# Patient Record
Sex: Female | Born: 1955
Health system: Southern US, Community
[De-identification: ages and names within clinical notes are randomized; demographics above are authoritative.]

## PROBLEM LIST (undated history)

## (undated) DIAGNOSIS — M1990 Unspecified osteoarthritis, unspecified site: Secondary | ICD-10-CM

## (undated) DIAGNOSIS — M109 Gout, unspecified: Secondary | ICD-10-CM

## (undated) DIAGNOSIS — G4730 Sleep apnea, unspecified: Secondary | ICD-10-CM

## (undated) DIAGNOSIS — R51 Headache: Secondary | ICD-10-CM

## (undated) DIAGNOSIS — M2550 Pain in unspecified joint: Secondary | ICD-10-CM

## (undated) DIAGNOSIS — J302 Other seasonal allergic rhinitis: Secondary | ICD-10-CM

## (undated) DIAGNOSIS — R7303 Prediabetes: Secondary | ICD-10-CM

## (undated) DIAGNOSIS — J45909 Unspecified asthma, uncomplicated: Secondary | ICD-10-CM

## (undated) DIAGNOSIS — K429 Umbilical hernia without obstruction or gangrene: Secondary | ICD-10-CM

## (undated) DIAGNOSIS — I10 Essential (primary) hypertension: Secondary | ICD-10-CM

## (undated) HISTORY — PX: HERNIA REPAIR: SHX51

## (undated) HISTORY — PX: SHOULDER SURGERY: SHX246

## (undated) HISTORY — DX: Sleep apnea, unspecified: G47.30

## (undated) HISTORY — PX: TONSILLECTOMY AND ADENOIDECTOMY: SUR1326

## (undated) HISTORY — DX: Unspecified asthma, uncomplicated: J45.909

## (undated) HISTORY — DX: Essential (primary) hypertension: I10

## (undated) HISTORY — DX: Pain in unspecified joint: M25.50

## (undated) HISTORY — DX: Gout, unspecified: M10.9

## (undated) HISTORY — DX: Other seasonal allergic rhinitis: J30.2

---

## 1996-09-08 HISTORY — PX: BREAST SURGERY: SHX581

## 2000-10-02 ENCOUNTER — Emergency Department (HOSPITAL_COMMUNITY): Admission: EM | Admit: 2000-10-02 | Discharge: 2000-10-02 | Payer: Self-pay

## 2000-12-31 ENCOUNTER — Other Ambulatory Visit: Admission: RE | Admit: 2000-12-31 | Discharge: 2000-12-31 | Payer: Self-pay | Admitting: Obstetrics and Gynecology

## 2000-12-31 ENCOUNTER — Encounter: Payer: Self-pay | Admitting: *Deleted

## 2000-12-31 ENCOUNTER — Encounter: Admission: RE | Admit: 2000-12-31 | Discharge: 2000-12-31 | Payer: Self-pay | Admitting: *Deleted

## 2001-08-02 ENCOUNTER — Encounter: Admission: RE | Admit: 2001-08-02 | Discharge: 2001-08-02 | Payer: Self-pay | Admitting: Family Medicine

## 2001-08-16 ENCOUNTER — Encounter: Admission: RE | Admit: 2001-08-16 | Discharge: 2001-08-16 | Payer: Self-pay | Admitting: Family Medicine

## 2001-09-15 ENCOUNTER — Encounter: Admission: RE | Admit: 2001-09-15 | Discharge: 2001-09-15 | Payer: Self-pay | Admitting: Family Medicine

## 2001-09-29 ENCOUNTER — Encounter: Admission: RE | Admit: 2001-09-29 | Discharge: 2001-09-29 | Payer: Self-pay | Admitting: Family Medicine

## 2001-10-18 ENCOUNTER — Encounter: Admission: RE | Admit: 2001-10-18 | Discharge: 2001-10-18 | Payer: Self-pay | Admitting: Family Medicine

## 2001-11-08 ENCOUNTER — Encounter: Admission: RE | Admit: 2001-11-08 | Discharge: 2001-11-08 | Payer: Self-pay | Admitting: Family Medicine

## 2001-11-15 ENCOUNTER — Encounter: Admission: RE | Admit: 2001-11-15 | Discharge: 2001-11-15 | Payer: Self-pay | Admitting: Family Medicine

## 2001-12-20 ENCOUNTER — Encounter: Admission: RE | Admit: 2001-12-20 | Discharge: 2001-12-20 | Payer: Self-pay | Admitting: Family Medicine

## 2002-02-22 ENCOUNTER — Other Ambulatory Visit: Admission: RE | Admit: 2002-02-22 | Discharge: 2002-02-22 | Payer: Self-pay | Admitting: Obstetrics and Gynecology

## 2003-02-24 ENCOUNTER — Other Ambulatory Visit: Admission: RE | Admit: 2003-02-24 | Discharge: 2003-02-24 | Payer: Self-pay | Admitting: Obstetrics and Gynecology

## 2004-02-27 ENCOUNTER — Other Ambulatory Visit: Admission: RE | Admit: 2004-02-27 | Discharge: 2004-02-27 | Payer: Self-pay | Admitting: Obstetrics and Gynecology

## 2005-05-05 ENCOUNTER — Other Ambulatory Visit: Admission: RE | Admit: 2005-05-05 | Discharge: 2005-05-05 | Payer: Self-pay | Admitting: Obstetrics and Gynecology

## 2005-05-09 ENCOUNTER — Encounter: Admission: RE | Admit: 2005-05-09 | Discharge: 2005-05-09 | Payer: Self-pay | Admitting: Obstetrics and Gynecology

## 2006-01-03 ENCOUNTER — Emergency Department (HOSPITAL_COMMUNITY): Admission: EM | Admit: 2006-01-03 | Discharge: 2006-01-04 | Payer: Self-pay | Admitting: Emergency Medicine

## 2007-06-09 ENCOUNTER — Ambulatory Visit (HOSPITAL_COMMUNITY): Admission: RE | Admit: 2007-06-09 | Discharge: 2007-06-09 | Payer: Self-pay | Admitting: *Deleted

## 2007-06-23 ENCOUNTER — Ambulatory Visit (HOSPITAL_COMMUNITY): Admission: RE | Admit: 2007-06-23 | Discharge: 2007-06-23 | Payer: Self-pay | Admitting: *Deleted

## 2007-06-28 ENCOUNTER — Emergency Department (HOSPITAL_COMMUNITY): Admission: EM | Admit: 2007-06-28 | Discharge: 2007-06-28 | Payer: Self-pay | Admitting: Podiatry

## 2007-06-28 ENCOUNTER — Encounter: Admission: RE | Admit: 2007-06-28 | Discharge: 2007-06-28 | Payer: Self-pay | Admitting: *Deleted

## 2007-09-09 HISTORY — PX: LAPAROSCOPIC GASTRIC BANDING: SHX1100

## 2008-01-10 ENCOUNTER — Encounter: Admission: RE | Admit: 2008-01-10 | Discharge: 2008-04-09 | Payer: Self-pay | Admitting: *Deleted

## 2008-01-25 ENCOUNTER — Ambulatory Visit (HOSPITAL_COMMUNITY): Admission: RE | Admit: 2008-01-25 | Discharge: 2008-01-26 | Payer: Self-pay | Admitting: *Deleted

## 2009-06-24 ENCOUNTER — Emergency Department (HOSPITAL_BASED_OUTPATIENT_CLINIC_OR_DEPARTMENT_OTHER): Admission: EM | Admit: 2009-06-24 | Discharge: 2009-06-24 | Payer: Self-pay | Admitting: Emergency Medicine

## 2010-10-31 ENCOUNTER — Other Ambulatory Visit: Payer: Self-pay | Admitting: Obstetrics and Gynecology

## 2010-10-31 DIAGNOSIS — Z1231 Encounter for screening mammogram for malignant neoplasm of breast: Secondary | ICD-10-CM

## 2010-11-08 ENCOUNTER — Ambulatory Visit
Admission: RE | Admit: 2010-11-08 | Discharge: 2010-11-08 | Disposition: A | Discharge status: Home / Self Care | Payer: 59 | Source: Ambulatory Visit | Attending: Obstetrics and Gynecology | Admitting: Obstetrics and Gynecology

## 2010-11-08 DIAGNOSIS — Z1231 Encounter for screening mammogram for malignant neoplasm of breast: Secondary | ICD-10-CM

## 2011-01-21 NOTE — Op Note (Signed)
Gabrielle Moore, Gabrielle Moore             ACCOUNT NO.:  [id number]   MEDICAL RECORD NO.:  [id number]          PATIENT TYPE:  OIB   LOCATION:  1523                         FACILITY:  [facility]   PHYSICIAN:  Alfonse Ras, MD   DATE OF BIRTH:  1956-04-02   DATE OF PROCEDURE:  01/25/2008  DATE OF DISCHARGE:                               OPERATIVE REPORT   PREOPERATIVE DIAGNOSIS:  Medically refractory morbid obesity.   POSTOPERATIVE DIAGNOSIS:  Medically refractory morbid obesity.   PROCEDURE:  Laparoscopic adjustable gastric banding with the APS  Allergan system.   ANESTHESIA:  General.   SURGEON:  Alfonse Ras, MD.   ASSISTANTSandria Bales. Ezzard Standing, M.D.   INDICATIONS:  The patient presents now for laparoscopic adjustable  gastric banding after having extensive preoperative evaluation.  No  evidence of reflux.  No evidence of hiatal hernia, but significant  comorbidities and presents for laparoscopic adjustable gastric banding.  The risks and benefits and nonsurgical options were discussed with the  patient and she opts to proceed.   DESCRIPTION:  The patient was taken to the operating room, placed in  supine position.  After adequate general anesthesia was induced using  endotracheal tube, the abdomen was prepped and draped in normal sterile  fashion.  Using 11 mm trocar in the Optivu left upper quadrant under  direct vision, peritoneal access was obtained.  Pneumoperitoneum was  obtained.  Under direct vision, additional 15-mm trocar was placed in  the right upper quadrant, a 11-mm trocar was placed on the right upper  quadrant.  Umbilical hernia was identified.  There was some incarcerated  omentum, this was not reduced.  A 11-mm trocar was placed in the  periumbilical region.  A 5-mm trocar was placed in subxiphoid region and  Nathanson  liver retractor was placed.  The left lateral segment of the  liver was retracted anteriorly and the stomach was identified.  There  was no  pathology noted.  The angle of his was sharply and bluntly  dissected using the band passer in the retrogastric pars flaccida  technique.  It was passed from the right crus with a crossing fat was  and brought out at the angle of his without difficulty.  The APS band  was then passed through the 15-mm port and passed around the stomach.  The sizing balloon was placed down, 15 cc of air was placed, it was  pulled back and there was no evidence of hiatal hernia.  The band was  closed.  A serosal to serosal imbricating sutures using 2-0 Ethibond  sutures were accomplished anterior to the band.  The tubing was then  brought out through the 11 mm right upper quadrant trocar site.  Nathanson liver retractor was removed.  Trocars were removed.  Pneumoperitoneum was released.  The tubing was affixed to the port and  the port was affixed using  2-0 Prolene sutures to the anterior rectus fascia.  The skin was closed  with staples.  All tissues were injected, all incisions were injected  using 0.5 Marcaine.  Sterile dressings were applied.  The patient  tolerated the procedure well went to PACU in good condition.      Alfonse Ras, MD  Electronically Signed     KRE/MEDQ  D:  01/25/2008  T:  01/25/2008  Job:  [id number]

## 2011-06-04 LAB — DIFFERENTIAL
Basophils Absolute: 0
Lymphocytes Relative: 22
Lymphs Abs: 1.7
Monocytes Absolute: 0.5
Neutro Abs: 5.5

## 2011-06-04 LAB — CBC
HCT: 43.6
Hemoglobin: 14.8
Platelets: 226
RDW: 13.8
WBC: 7.8

## 2011-06-04 LAB — PREGNANCY, URINE: Preg Test, Ur: NEGATIVE

## 2011-06-18 LAB — DIFFERENTIAL
Basophils Absolute: 0.1
Basophils Relative: 2 — ABNORMAL HIGH
Neutro Abs: 4.8
Neutrophils Relative %: 61

## 2011-06-18 LAB — COMPREHENSIVE METABOLIC PANEL
Alkaline Phosphatase: 65
BUN: 12
Chloride: 104
Creatinine, Ser: 0.69
GFR calc non Af Amer: >60
Glucose, Bld: 138 — ABNORMAL HIGH
Potassium: 3.7
Total Bilirubin: 0.9

## 2011-06-18 LAB — CBC
HCT: 42.6
Hemoglobin: 14.7
MCV: 88
WBC: 7.8

## 2011-06-18 LAB — D-DIMER, QUANTITATIVE: D-Dimer, Quant: <0.22

## 2011-07-15 ENCOUNTER — Telehealth (INDEPENDENT_AMBULATORY_CARE_PROVIDER_SITE_OTHER): Payer: Self-pay | Admitting: Surgery

## 2011-07-15 NOTE — Telephone Encounter (Signed)
07/15/11 recall mailed for bariatric surgery follow-up. Adv pt to call [facility] to schedule an appt...cef °

## 2011-10-31 ENCOUNTER — Encounter (INDEPENDENT_AMBULATORY_CARE_PROVIDER_SITE_OTHER): Payer: 59

## 2011-11-12 ENCOUNTER — Encounter (INDEPENDENT_AMBULATORY_CARE_PROVIDER_SITE_OTHER): Payer: Self-pay | Admitting: Surgery

## 2011-11-14 ENCOUNTER — Encounter (INDEPENDENT_AMBULATORY_CARE_PROVIDER_SITE_OTHER): Payer: Self-pay | Admitting: Surgery

## 2011-11-14 ENCOUNTER — Ambulatory Visit (INDEPENDENT_AMBULATORY_CARE_PROVIDER_SITE_OTHER): Payer: 59 | Admitting: Surgery

## 2011-11-14 VITALS — BP 140/80 | HR 84 | Resp 18 | Ht 67.0 in | Wt 228.0 lb

## 2011-11-14 DIAGNOSIS — Z9884 Bariatric surgery status: Secondary | ICD-10-CM | POA: Insufficient documentation

## 2011-11-14 NOTE — Progress Notes (Signed)
[facility]  Ovidio Kin, MD,  FACS [address] [place]  [address] [place], [place]    [address] Phone:  [phone] FAX:  [phone]   Re:   SYLWIA CUERVO DOB:   [birth year] MRN:   [id number]  ASSESSMENT AND PLAN: 1.  Lap band, APS - 01/25/2008.  By Dr. Colin Benton  Initial weight - 273, BMI - 42.9.  Frustrated by what she perceives as lack of weight loss.  But patient is down 40 to 50 pounds from her pre op weight.  She has an unrealistic goal weight.  I think her band may be too tight.  But out plan is:  1. Go to Lap Band website for menu suggestions, 2.  See dietitian to go over diet again, 3.  After 6 to 8 weeks of working on diet, if still trouble with food, will see [place] back about adjusting lap band - probably removing some fluid.  Her appointment is open right now, but I expect her to call back when she is ready.  2.  History of hypertension.  On Diovan.  HISTORY OF PRESENT ILLNESS: Chief Complaint  Patient presents with  . Lap Band Fill    GIANINA OLINDE is a 56 y.o. (DOB: Jan 08, 1956)  white female who is a patient of Provider Not In System and comes to me today for Lap band follow up.  She was seeing Ace Gins at [facility], but Barlow left about one year ago.  The patient was happy with her early success with her lap band. However, over the last 2-1/2 years her weight has been flat. Her original weight loss plan was to get down to 140 pounds. But I think this is unrealistic.  She has not seen [place] since 07/10/2009.  I talked about a target weight for her about 190 pounds. This represents a BMI of 30.  In talking to her, it seems that her band may be too tight. She says she can't eat a healthy stuff like steak or chicken. She can eat unhealthy processed food, like cheese bits. She also has somewhat tight at breakfast. She sometimes skips breakfast or has a protein drink.  When she goes out to eat, she feels that food gets stuck when she tries  to talk.  I talked to her about several options. First,  I encouraged her to go to the [facility] website to look at meals on that web site.  Secondly, I think she would benefit from revisiting the nutritionist to go for a LAP-BAND diet. Thir[place]y, I don't think there is a particular psychologic component, but I discussed this with her.  Fourthly, I suggested she try keeping a food diary before seeing the nutritionist. Then after she worked with the nutritionist, she can see [place] back in 6 or 8 weeks and adjust her band. I guessing that she'll need some fluid removed.   PHYSICAL EXAM: BP 140/80  Pulse 84  Resp 18  Ht 5\' 7"  (1.702 m)  Wt 228 lb (103.42 kg)  BMI 35.71 kg/m2 General: WN obese WF who is alert and generally healthy appearing.  HEENT: Normal. Pupils equal. Good dentition.  Lungs: Clear to auscultation and symmetric breath sounds. Heart:  RRR. No murmur or rub. Abdomen: Soft. No mass. No tenderness. No hernia. Normal bowel sounds.  Port in RUQ okay.  No other mass.  Rectal: Not done. Extremities:  Good strength and ROM  in upper and lower extremities. Neurologic:  Grossly intact to motor  and sensory function. Psychiatric: Has normal mood and affect. Behavior is normal.   DATA REVIEWED: Old chart.   Ovidio Kin, MD, FACS Office:  [phone]

## 2011-11-24 ENCOUNTER — Other Ambulatory Visit: Payer: Self-pay | Admitting: Obstetrics and Gynecology

## 2011-11-24 DIAGNOSIS — Z1231 Encounter for screening mammogram for malignant neoplasm of breast: Secondary | ICD-10-CM

## 2011-12-24 ENCOUNTER — Ambulatory Visit
Admission: RE | Admit: 2011-12-24 | Discharge: 2011-12-24 | Disposition: A | Discharge status: Home / Self Care | Payer: 59 | Source: Ambulatory Visit | Attending: Obstetrics and Gynecology | Admitting: Obstetrics and Gynecology

## 2011-12-24 DIAGNOSIS — Z1231 Encounter for screening mammogram for malignant neoplasm of breast: Secondary | ICD-10-CM

## 2012-01-14 ENCOUNTER — Other Ambulatory Visit (INDEPENDENT_AMBULATORY_CARE_PROVIDER_SITE_OTHER): Payer: Self-pay

## 2012-01-14 ENCOUNTER — Telehealth (INDEPENDENT_AMBULATORY_CARE_PROVIDER_SITE_OTHER): Payer: Self-pay

## 2012-01-14 DIAGNOSIS — Z9884 Bariatric surgery status: Secondary | ICD-10-CM

## 2012-01-14 NOTE — Telephone Encounter (Signed)
Patient had called back earlier and stated she called the Nutrition center to make an appt but they needed a referral.  I put one in and notified the pt

## 2012-01-14 NOTE — Telephone Encounter (Signed)
I received a msg from [place] to call pt.  I do not know what it's regarding but I left her a message that I returned her call.

## 2012-01-14 NOTE — Telephone Encounter (Signed)
Message copied by Ivory Broad on Wed Jan 14, 2012 10:47 AM ------      Message from: Marnette Burgess      Created: Tue Jan 13, 2012  4:34 PM      Contact: [phone]       Please call

## 2012-01-15 ENCOUNTER — Other Ambulatory Visit (INDEPENDENT_AMBULATORY_CARE_PROVIDER_SITE_OTHER): Payer: Self-pay

## 2012-01-15 DIAGNOSIS — Z9884 Bariatric surgery status: Secondary | ICD-10-CM

## 2012-02-16 ENCOUNTER — Encounter: Payer: 59 | Attending: Surgery | Admitting: *Deleted

## 2012-02-16 VITALS — Ht 67.0 in | Wt 237.5 lb

## 2012-02-16 DIAGNOSIS — Z9884 Bariatric surgery status: Secondary | ICD-10-CM

## 2012-02-16 NOTE — Patient Instructions (Addendum)
Goals:  Follow Modified Bariatric Surgery Diet  Eat 3-6 small meals/snacks, every 3-5 hrs  Increase lean protein foods to meet 60-80g goal  Increase fluid intake to 64oz +  Add 15 grams of carbohydrate (fruit, whole grain, starchy vegetable) with meals  Avoid drinking 15 minutes before, during and 30 minutes after eating  Aim for >30 min of physical activity daily  Track food intake for several days and email to me.   Resume the daily supplementation (1 MVI and 3 calcium citrate)

## 2012-02-16 NOTE — Progress Notes (Signed)
  Follow-up visit:  4 Years Post-Operative LAGB Surgery  Medical Nutrition Therapy:  Appt start time: 1145   End time:  1245.  Primary concerns today: Post-operative Bariatric Surgery Nutrition Management. Gabrielle Moore comes in today for f/u and reestablish care. 4 yrs s/p LAGB and discouraged about weight. Food recall reveals poor food choices and minimal exercise. Discussed restarting her wt loss with the modified post-op diet and restricting CHO. No supplementation and minimal intake of protein noted at this time.    Surgery date: 01/25/08 (Dr. Colin Benton)   Start weight per Dr. Ezzard Standing: 273.0 lbs  Weight today: 237.5 lbs Weight change: n/a Total weight lost: 35.5 lbs BMI: 37.2 kg/m^2 Weight goal: unknown % Weight goal met: unknown  24-hr recall:  B (AM): None or cereal Snk (AM): Cheese-its   L (PM): Toasted bread w/ peanut butter & honey OR tuna salad OR pizza Snk (PM): None  D (PM): Chicken wings w/ garlic honey bbq sauce & caesar salad Snk (PM): None  Fluid intake: ~60 oz Estimated total protein intake: ~40 g  Medications: Diovan Supplementation: None  Using straws: No Drinking while eating: No Hair loss: No Carbonated beverages: None reported N/V/D/C: None reported Last Lap-Band fill:  No recent band fills  Recent physical activity:  Walks dog 1 hour/day (stops often)  Progress Towards Goal(s):  In progress.  Handouts given during visit include:  Modified Bariatric Surgery Post-Op diet  Bariatric Surgery Fast Food Guide   Nutritional Diagnosis:  [place]-3.3 Overweight/obesity As related to 4 years s/p LAGB.  As evidenced by patient not following post-op nutrition guidelines and poor food choices.    Intervention:  Nutrition education/reinforcement.  Monitoring/Evaluation:  Dietary intake, exercise, lap band fills, and body weight. Follow up in 6 weeks post-op visit.

## 2012-02-18 ENCOUNTER — Encounter: Payer: Self-pay | Admitting: *Deleted

## 2012-03-29 ENCOUNTER — Ambulatory Visit: Payer: 59 | Admitting: *Deleted

## 2012-07-19 ENCOUNTER — Encounter: Payer: Self-pay | Admitting: Family Medicine

## 2012-07-19 ENCOUNTER — Ambulatory Visit (INDEPENDENT_AMBULATORY_CARE_PROVIDER_SITE_OTHER): Payer: 59 | Admitting: Family Medicine

## 2012-07-19 VITALS — BP 102/80 | HR 87 | Temp 97.8°F | Ht 65.75 in | Wt 237.0 lb

## 2012-07-19 DIAGNOSIS — E669 Obesity, unspecified: Secondary | ICD-10-CM

## 2012-07-19 DIAGNOSIS — I10 Essential (primary) hypertension: Secondary | ICD-10-CM

## 2012-07-19 DIAGNOSIS — Z131 Encounter for screening for diabetes mellitus: Secondary | ICD-10-CM

## 2012-07-19 DIAGNOSIS — R5381 Other malaise: Secondary | ICD-10-CM

## 2012-07-19 DIAGNOSIS — R5383 Other fatigue: Secondary | ICD-10-CM

## 2012-07-19 DIAGNOSIS — Z13220 Encounter for screening for lipoid disorders: Secondary | ICD-10-CM

## 2012-07-19 DIAGNOSIS — Z23 Encounter for immunization: Secondary | ICD-10-CM

## 2012-07-19 LAB — LIPID PANEL
Cholesterol: 198 mg/dL (ref 0–200)
Total CHOL/HDL Ratio: 4
Triglycerides: 114 mg/dL (ref 0.0–149.0)

## 2012-07-19 LAB — BASIC METABOLIC PANEL
BUN: 15 mg/dL (ref 6–23)
CO2: 27 mEq/L (ref 19–32)
Chloride: 104 mEq/L (ref 96–112)
Glucose, Bld: 103 mg/dL — ABNORMAL HIGH (ref 70–99)
Potassium: 3.5 mEq/L (ref 3.5–5.1)

## 2012-07-19 LAB — TSH: TSH: 1.85 u[IU]/mL (ref 0.35–5.50)

## 2012-07-19 MED ORDER — VALSARTAN-HYDROCHLOROTHIAZIDE 80-12.5 MG PO TABS: 1.0000 | ORAL_TABLET | Freq: Every day | ORAL | Status: DC

## 2012-07-19 NOTE — Progress Notes (Signed)
Chief Complaint  Patient presents with  . Establish Care    HPI: Gabrielle Moore is here to establish care. Former PCP moved.  Has the following concerns today: -refill on BP medication, stable for a long time -started weight watchers last week - pt is really excited about -she is going to start walking too with her daughter -wants labs today -unrest full sleep, snores, works a lot, sleep well, but had OSA in the past and resolved after bariatric - wants to check TSH, wants to wait of repeat sleep test as working to loose weight    Other Providers: -gyn, Dr. Senaida Ores - get yearly physicals with her, paps, mammos, stool cards  Vaccine: wants flu vaccine today, has had tetanus in last five years  ROS: See pertinent positives and negatives per HPI.  Past Medical History  Diagnosis Date  . Hypertension   . Asthma     as a child    Family History  Problem Relation Age of Onset  . Hyperlipidemia Mother   . Hypertension Mother   . Heart disease      parent   . Hyperlipidemia Father   . Hypertension Father   . Heart disease Father 51    History   Social History  . Marital Status: Married    Spouse Name: N/A    Number of Children: N/A  . Years of Education: N/A   Social History Main Topics  . Smoking status: Never Smoker   . Smokeless tobacco: None  . Alcohol Use: No  . Drug Use: No  . Sexually Active: None   Other Topics Concern  . None   Social History Narrative  . None    Current outpatient prescriptions:calcium carbonate 200 MG capsule, Take 1,000 mg by mouth daily., Disp: , Rfl: ;  loratadine (CLARITIN) 10 MG tablet, Take 10 mg by mouth daily., Disp: , Rfl: ;  valsartan-hydrochlorothiazide (DIOVAN-HCT) 80-12.5 MG per tablet, Take 1 tablet by mouth daily. Pt request BRAND NAME ONLY! Thanks, Disp: 90 tablet, Rfl: 1;  vitamin C (ASCORBIC ACID) 500 MG tablet, Take 500 mg by mouth 2 (two) times daily., Disp: , Rfl:  [DISCONTINUED]  valsartan-hydrochlorothiazide (DIOVAN-HCT) 80-12.5 MG per tablet, Take 1 tablet by mouth daily. Brand only!, Disp: , Rfl:   EXAM:  Filed Vitals:   07/19/12 0803  BP: 102/80  Pulse: 87  Temp: 97.8 F (36.6 C)    Body mass index is 38.54 kg/(m^2).  GENERAL: vitals reviewed and listed above, alert, oriented, appears well hydrated and in no acute distress  HEENT: atraumatic, conjunttiva clear, no obvious abnormalities on inspection of external nose and ears  NECK: no obvious masses on inspection  LUNGS: clear to auscultation bilaterally, no wheezes, rales or rhonchi, good air movement  CV: HRRR, no peripheral edema  MS: moves all extremities without noticeable abnormality  PSYCH: pleasant and cooperative, no obvious depression or anxiety  ASSESSMENT AND PLAN:  Discussed the following assessment and plan:  1. Hypertension  Basic metabolic panel  2. Fatigue  TSH  3. Screening for diabetes mellitus  Hemoglobin A1c  4. Screening for hyperlipidemia  Lipid Panel  5. Obesity     -pt UTD on health maintenance -discussed importance of healthy lifestyle given HTN and FH - congratulated on her efforts -advised sleep study to reassess for OSA - pt refused, prefers to attempt lifestyle changes for now  -We reviewed the PMH, PSH, FH, SH, Meds and Allergies. -We provided refills for any medications we will  prescribe as needed. -We addressed current concerns per orders and patient instructions. -We have advised patient to follow up per instructions below. -Influenza vaccine given today  -Patient advised to return or notify a doctor immediately if symptoms worsen or persist or new concerns arise.  Patient Instructions  -We have ordered labs or studies at this visit. It can take up to 1-2 weeks for results and processing. We will contact you with instructions IF your results are abnormal. Normal results will be released to your [facility]. If you have not heard from [place] or can not find your  results in [facility] in 2 weeks please contact our office.  -PLEASE SIGN UP FOR MYCHART TODAY   We recommend the following healthy lifestyle measures: - eat a healthy diet consisting of lots of vegetables, fruits, beans, nuts, seeds, healthy meats such as white chicken and fish and whole grains.  - avoid fried foods, fast food, processed foods, sodas, red meet and other fattening foods.  - get a least 150 minutes of aerobic exercise per week.   Follow up in: 3 months      Calen Geister R.

## 2012-07-19 NOTE — Patient Instructions (Addendum)
-  We have ordered labs or studies at this visit. It can take up to 1-2 weeks for results and processing. We will contact you with instructions IF your results are abnormal. Normal results will be released to your MYCHART. If you have not heard from us or can not find your results in MYCHART in 2 weeks please contact our office.  -PLEASE SIGN UP FOR MYCHART TODAY   We recommend the following healthy lifestyle measures: - eat a healthy diet consisting of lots of vegetables, fruits, beans, nuts, seeds, healthy meats such as white chicken and fish and whole grains.  - avoid fried foods, fast food, processed foods, sodas, red meet and other fattening foods.  - get a least 150 minutes of aerobic exercise per week.   Follow up in: 3 months  

## 2012-10-25 ENCOUNTER — Ambulatory Visit: Payer: 59 | Admitting: Family Medicine

## 2012-11-23 ENCOUNTER — Other Ambulatory Visit: Payer: Self-pay

## 2012-11-23 DIAGNOSIS — Z9889 Other specified postprocedural states: Secondary | ICD-10-CM

## 2012-11-23 DIAGNOSIS — Z1231 Encounter for screening mammogram for malignant neoplasm of breast: Secondary | ICD-10-CM

## 2012-12-24 ENCOUNTER — Ambulatory Visit: Payer: 59

## 2012-12-28 ENCOUNTER — Ambulatory Visit
Admission: RE | Admit: 2012-12-28 | Discharge: 2012-12-28 | Disposition: A | Discharge status: Home / Self Care | Payer: 59 | Source: Ambulatory Visit

## 2012-12-28 DIAGNOSIS — Z9889 Other specified postprocedural states: Secondary | ICD-10-CM

## 2012-12-28 DIAGNOSIS — Z1231 Encounter for screening mammogram for malignant neoplasm of breast: Secondary | ICD-10-CM

## 2012-12-29 ENCOUNTER — Encounter: Payer: Self-pay | Admitting: Family Medicine

## 2012-12-29 ENCOUNTER — Ambulatory Visit (INDEPENDENT_AMBULATORY_CARE_PROVIDER_SITE_OTHER): Payer: 59 | Admitting: Family Medicine

## 2012-12-29 VITALS — BP 128/82 | HR 99 | Temp 98.2°F | Wt 244.0 lb

## 2012-12-29 DIAGNOSIS — I10 Essential (primary) hypertension: Secondary | ICD-10-CM

## 2012-12-29 DIAGNOSIS — R7303 Prediabetes: Secondary | ICD-10-CM

## 2012-12-29 DIAGNOSIS — R7309 Other abnormal glucose: Secondary | ICD-10-CM

## 2012-12-29 DIAGNOSIS — G4733 Obstructive sleep apnea (adult) (pediatric): Secondary | ICD-10-CM

## 2012-12-29 DIAGNOSIS — Z9884 Bariatric surgery status: Secondary | ICD-10-CM

## 2012-12-29 DIAGNOSIS — E669 Obesity, unspecified: Secondary | ICD-10-CM

## 2012-12-29 NOTE — Patient Instructions (Signed)
-  We placed a referral for you as discussed to the pulmonologist for your sleep apnea and to the nutritionist. It usually takes about 1-2 weeks to process and schedule this referral. If you have not heard from [place] regarding this appointment in 2 weeks please contact our office.  We recommend the following healthy lifestyle measures: - eat a healthy diet consisting of lots of vegetables, fruits, beans, nuts, seeds, healthy meats such as white chicken and fish and whole grains.  - avoid fried foods, fast food, processed foods, sodas, red meet and other fattening foods.  - get a least 150 minutes of aerobic exercise per week.   Follow up in 4-6 months, morning appointment and come fasting

## 2012-12-29 NOTE — Progress Notes (Signed)
Chief Complaint  Patient presents with  . 3 month follow up    HPI: Follow up:  HTN/Obesity/prediabetes/HLD: -working on lifestyle changes last visit -reports: she did not stick with weight watchers - she feels like she needs more help, wants to see nutritionist -denies:CP, SOB, swelling, HA, polyuria, polydipsia -walks daily 30-60 minutes  OSA: -resolved with bariatric surgery, then recurred -offered sleep study last visit - refused -tired all the time -apneic spells per daughter and loud snoring -wants to see pulm and retest and discuss options as did not like mask  ROS: See pertinent positives and negatives per HPI.  Past Medical History  Diagnosis Date  . Hypertension   . Asthma     as a child    Family History  Problem Relation Age of Onset  . Hyperlipidemia Mother   . Hypertension Mother   . Heart disease      parent   . Hyperlipidemia Father   . Hypertension Father   . Heart disease Father 25    History   Social History  . Marital Status: Married    Spouse Name: N/A    Number of Children: N/A  . Years of Education: N/A   Social History Main Topics  . Smoking status: Never Smoker   . Smokeless tobacco: None  . Alcohol Use: No  . Drug Use: No  . Sexually Active: None   Other Topics Concern  . None   Social History Narrative  . None    Current outpatient prescriptions:loratadine (CLARITIN) 10 MG tablet, Take 10 mg by mouth daily., Disp: , Rfl: ;  Methylcobalamin (B-12) 5000 MCG TBDP, Take by mouth., Disp: , Rfl: ;  valsartan-hydrochlorothiazide (DIOVAN-HCT) 80-12.5 MG per tablet, Take 1 tablet by mouth daily. Pt request BRAND NAME ONLY! Thanks, Disp: 90 tablet, Rfl: 1;  vitamin C (ASCORBIC ACID) 500 MG tablet, Take 500 mg by mouth 2 (two) times daily., Disp: , Rfl:   EXAM:  Filed Vitals:   12/29/12 0845  BP: 128/82  Pulse: 99  Temp: 98.2 F (36.8 C)    Body mass index is 39.69 kg/(m^2).  GENERAL: vitals reviewed and listed above, alert,  oriented, appears well hydrated and in no acute distress  HEENT: atraumatic, conjunttiva clear, no obvious abnormalities on inspection of external nose and ears  NECK: no obvious masses on inspection  LUNGS: clear to auscultation bilaterally, no wheezes, rales or rhonchi, good air movement  CV: HRRR, no peripheral edema  MS: moves all extremities without noticeable abnormality  PSYCH: pleasant and cooperative, no obvious depression or anxiety  ASSESSMENT AND PLAN:  Discussed the following assessment and plan:  OSA (obstructive sleep apnea) - Plan: Ambulatory referral to Pulmonology  Obesity - Plan: Ambulatory referral to diabetic education  History of laparoscopic adjustable gastric banding, APS, 01/25/2008. - Plan: Ambulatory referral to diabetic education  Hypertension  Prediabetes - Plan: Ambulatory referral to diabetic education   -will check labs next visit, referrals per below -follow up 4-6 months -Patient advised to return or notify a doctor immediately if symptoms worsen or persist or new concerns arise.  There are no Patient Instructions on file for this visit.   Kriste Basque R.

## 2013-01-18 ENCOUNTER — Ambulatory Visit: Payer: 59 | Admitting: *Deleted

## 2013-01-25 ENCOUNTER — Institutional Professional Consult (permissible substitution): Payer: 59 | Admitting: Pulmonary Disease

## 2013-03-10 ENCOUNTER — Institutional Professional Consult (permissible substitution): Payer: 59 | Admitting: Pulmonary Disease

## 2013-05-02 ENCOUNTER — Ambulatory Visit (INDEPENDENT_AMBULATORY_CARE_PROVIDER_SITE_OTHER): Payer: 59 | Admitting: Pulmonary Disease

## 2013-05-02 ENCOUNTER — Encounter: Payer: Self-pay | Admitting: Pulmonary Disease

## 2013-05-02 VITALS — BP 120/82 | HR 89 | Temp 98.5°F | Ht 66.5 in | Wt 238.8 lb

## 2013-05-02 DIAGNOSIS — R0683 Snoring: Secondary | ICD-10-CM | POA: Insufficient documentation

## 2013-05-02 DIAGNOSIS — G4733 Obstructive sleep apnea (adult) (pediatric): Secondary | ICD-10-CM

## 2013-05-02 NOTE — Patient Instructions (Addendum)
Take the next 6mos and work aggressively on weight loss.  If you are not making good progress, or if the impact to your or your husband's quality of life becomes more of an issue, please call.

## 2013-05-02 NOTE — Progress Notes (Signed)
Subjective:    Patient ID: Gabrielle Moore, female    DOB: [birth year], 57 y.o.   MRN: [id number]  HPI The patient is a very pleasant 57 year old female who I've been asked to see for possible obstructive sleep apnea.  She was diagnosed with sleep apnea in 2008, and was started on a CPAP device.  She subsequently underwent area after surgery, and loss of over 50 pounds with significant improvement in her symptoms.  She stopped using the CPAP, and felt like she was doing well, but over the last year or so she has developed loud snoring.  Her husband complains about her snoring, but has not seen an abnormal breathing pattern during sleep or witnessed apneas.  The patient currently feels that she sleeps very well through the night, with no awakenings.  She feels rested in the mornings upon arising, and denies any sleep pressure at work unless it is extremely quiet.  She is able to watch television and movies in the evening, although she does not do a lot of this.  She will have some sleepiness if these activities are boring.  She has some mild sleep pressure driving long distances.  The patient states that her weight is been completely stable since her initial significant weight loss.  Her Epworth score today is 12.    Sleep Questionnaire What time do you typically go to bed?( Between what hours) 930 930 at 1558 on 05/02/13 by Maisie Fus, CMA How long does it take you to fall asleep? instantly instantly at 1558 on 05/02/13 by Maisie Fus, CMA How many times during the night do you wake up? 0 0 at 1558 on 05/02/13 by Maisie Fus, CMA What time do you get out of bed to start your day? 0500 0500 at 1558 on 05/02/13 by Maisie Fus, CMA Do you drive or operate heavy machinery in your occupation? No No at 1558 on 05/02/13 by Maisie Fus, CMA How much has your weight changed (up or down) over the past two years? (In pounds) 0 oz (0 kg) 0 oz (0 kg) at 1558 on 05/02/13 by Maisie Fus, CMA Have you ever had a sleep study before? Yes Yes at 1558 on 05/02/13 by Maisie Fus, CMA If yes, location of study? [facility]  at 1558 on 05/02/13 by Maisie Fus, CMA If yes, date of study? 2008 2008 at 1558 on 05/02/13 by Maisie Fus, CMA Do you currently use CPAP? NoNo owner of CPAP--does not use at 1558 on 05/02/13 by Maisie Fus, CMA If so, what pressure? Do you wear oxygen at any time? No No at 1558 on 05/02/13 by Maisie Fus, CMA   Review of Systems  Constitutional: Negative for fever and unexpected weight change.  HENT: Negative for ear pain, nosebleeds, congestion, sore throat, rhinorrhea, sneezing, trouble swallowing, dental problem, postnasal drip and sinus pressure.   Eyes: Negative for redness and itching.  Respiratory: Negative for cough, chest tightness, shortness of breath and wheezing.   Cardiovascular: Negative for palpitations and leg swelling.  Gastrointestinal: Negative for nausea and vomiting.  Genitourinary: Negative for dysuria.  Musculoskeletal: Negative for joint swelling.  Skin: Negative for rash.  Neurological: Negative for headaches.  Hematological: Does not bruise/bleed easily.  Psychiatric/Behavioral: Negative for dysphoric mood. The patient is not nervous/anxious.        Objective:   Physical Exam Constitutional:  Obese female, no acute distress  HENT:  Nares patent without discharge, mild septal deviation to the left with narrowing.   Oropharynx without exudate, palate and uvula are mildly elongated.   Eyes:  Perrla, eomi, no scleral icterus  Neck:  No JVD, no TMG, enlarged circumference with soft tissue  Cardiovascular:  Normal rate, regular rhythm, no rubs or gallops.  No murmurs        Intact distal pulses  Pulmonary :  Normal breath sounds, no stridor or respiratory distress   No rales, rhonchi, or wheezing  Abdominal:  Soft, nondistended, bowel sounds present.   No tenderness noted.   Musculoskeletal:  No lower extremity edema noted.  Lymph Nodes:  No cervical lymphadenopathy noted  Skin:  No cyanosis noted  Neurologic:  Alert, appropriate, moves all 4 extremities without obvious deficit.         Assessment & Plan:

## 2013-05-02 NOTE — Assessment & Plan Note (Signed)
It is unclear from the patient's history whether she has less snoring, or whether she still has sleep disordered breathing.  Would not surprise me if she has some degree of sleep apnea, but I suspect it is mild based on her symptoms.  Patient feels that she sleeps well, denies restless sleep, and has fairly good alertness during the day.  She does not have any significant underlying cardiopulmonary disease.  Therefore, I would not be opposed to giving her a period of time to work on weight loss alone.  I have outlined 2 different treatment paths, one being to proceed with home sleep testing to verify whether she still has sleep apnea or not, and the other a more conservative path with a trial of weight loss over the next 6 months.  As long as she is making progress with her weight, and is not overly symptomatic, I would feel comfortable with the more conservative path.  However, if she is making little progress with weight loss, or having worsening symptoms, she will need a followup study.

## 2013-05-04 ENCOUNTER — Telehealth: Payer: Self-pay | Admitting: Pulmonary Disease

## 2013-05-04 NOTE — Telephone Encounter (Signed)
Noted! Thank you

## 2013-05-25 ENCOUNTER — Other Ambulatory Visit: Payer: Self-pay | Admitting: Family Medicine

## 2013-05-31 ENCOUNTER — Encounter: Payer: Self-pay | Admitting: Family Medicine

## 2013-05-31 ENCOUNTER — Ambulatory Visit (INDEPENDENT_AMBULATORY_CARE_PROVIDER_SITE_OTHER): Payer: 59 | Admitting: Family Medicine

## 2013-05-31 VITALS — BP 124/84 | Temp 97.9°F | Wt 238.0 lb

## 2013-05-31 DIAGNOSIS — R7303 Prediabetes: Secondary | ICD-10-CM

## 2013-05-31 DIAGNOSIS — R7309 Other abnormal glucose: Secondary | ICD-10-CM

## 2013-05-31 DIAGNOSIS — E669 Obesity, unspecified: Secondary | ICD-10-CM

## 2013-05-31 DIAGNOSIS — H6120 Impacted cerumen, unspecified ear: Secondary | ICD-10-CM

## 2013-05-31 DIAGNOSIS — H6122 Impacted cerumen, left ear: Secondary | ICD-10-CM

## 2013-05-31 DIAGNOSIS — I10 Essential (primary) hypertension: Secondary | ICD-10-CM

## 2013-05-31 DIAGNOSIS — Z2911 Encounter for prophylactic immunotherapy for respiratory syncytial virus (RSV): Secondary | ICD-10-CM

## 2013-05-31 LAB — LIPID PANEL
Cholesterol: 181 mg/dL (ref 0–200)
HDL: 50.6 mg/dL (ref >39.00)
LDL Cholesterol: 113 mg/dL — ABNORMAL HIGH (ref 0–99)
Total CHOL/HDL Ratio: 4
Triglycerides: 86 mg/dL (ref 0.0–149.0)
VLDL: 17.2 mg/dL (ref 0.0–40.0)

## 2013-05-31 LAB — BASIC METABOLIC PANEL
CO2: 31 mEq/L (ref 19–32)
Calcium: 9.4 mg/dL (ref 8.4–10.5)
Creatinine, Ser: 0.7 mg/dL (ref 0.4–1.2)

## 2013-05-31 NOTE — Progress Notes (Signed)
Quick Note:    Released to mychart.

## 2013-05-31 NOTE — Progress Notes (Signed)
Chief Complaint  Patient presents with  . Follow-up  . Otalgia    HPI:  Follow up:  1) HTN: -takes diovan 80-12.5 -reports stable -denies: CP, SO, DOE, swelling, palpitation  2)OSA: -saw pulm, working on weight loss  3) Obesity/Prediabetes/HLD: -working on lifestyle changes: walking daily and working on diet -wt last visit 244  4)L ear feels full: -feels stopped up with a little decrease in hearing, feels like needs to pop -no pain, no drainage, no fevers, no UR symptoms or HA  HM: -offered flu - given -colon cancer screening: does CPEs with Dr. Nelly Laurence in gyn and does yearly stool cards - but wants stool cards  ROS: See pertinent positives and negatives per HPI.  Past Medical History  Diagnosis Date  . Hypertension   . Asthma     as a child  . Sleep apnea   . Seasonal allergies     Past Surgical History  Procedure Laterality Date  . Laparoscopic gastric banding  2009  . Cesarean section  1996  . Tonsillectomy and adenoidectomy    . Laparoscopic gastric banding  2009  . Breast surgery  1998    breast reduction    Family History  Problem Relation Age of Onset  . Hyperlipidemia Mother   . Hypertension Mother   . Heart disease      parent   . Hyperlipidemia Father   . Hypertension Father   . Heart disease Father 34    History   Social History  . Marital Status: Married    Spouse Name: N/A    Number of Children: N/A  . Years of Education: N/A   Occupational History  . Airline pilot. Mgr    Social History Main Topics  . Smoking status: Never Smoker   . Smokeless tobacco: None  . Alcohol Use: No  . Drug Use: No  . Sexual Activity: None   Other Topics Concern  . None   Social History Narrative  . None    Current outpatient prescriptions:DIOVAN HCT 80-12.5 MG per tablet, TAKE 1 TABLET BY MOUTH EVERY DAY, Disp: 90 tablet, Rfl: 1;  loratadine (CLARITIN) 10 MG tablet, Take 10 mg by mouth daily., Disp: , Rfl:   EXAM:  Filed Vitals:   05/31/13 0842  BP: 124/84  Temp: 97.9 F (36.6 C)    Body mass index is 37.84 kg/(m^2).  GENERAL: vitals reviewed and listed above, alert, oriented, appears well hydrated and in no acute distress  HEENT: atraumatic, conjunttiva clear, no obvious abnormalities on inspection of external nose and ears; soft ear wax bilat ears  NECK: no obvious masses on inspection  LUNGS: clear to auscultation bilaterally, no wheezes, rales or rhonchi, good air movement  CV: HRRR, no peripheral edema  MS: moves all extremities without noticeable abnormality  PSYCH: pleasant and cooperative, no obvious depression or anxiety  ASSESSMENT AND PLAN:  Discussed the following assessment and plan:  Hypertension - Plan: Basic metabolic panel  Need for prophylactic vaccination and inoculation against respiratory syncytial virus - Plan: Flu Vaccine QUAD 36+ mos PF IM (Fluarix)  Cerumen impaction, left  Obesity - Plan: Lipid Panel  Prediabetes - Plan: Hemoglobin A1c  -FASTING LABs -continue current medications -ear lavage left after discussion risks/benefits -lifestyle recs -follow up 6 months -Patient advised to return or notify a doctor immediately if symptoms worsen or persist or new concerns arise.  There are no Patient Instructions on file for this visit.   Kriste Basque R.

## 2013-08-01 ENCOUNTER — Ambulatory Visit (INDEPENDENT_AMBULATORY_CARE_PROVIDER_SITE_OTHER): Payer: 59 | Admitting: Family Medicine

## 2013-08-01 ENCOUNTER — Encounter: Payer: Self-pay | Admitting: Family Medicine

## 2013-08-01 VITALS — BP 120/80 | Temp 97.8°F | Wt 238.0 lb

## 2013-08-01 DIAGNOSIS — M79601 Pain in right arm: Secondary | ICD-10-CM

## 2013-08-01 DIAGNOSIS — M79609 Pain in unspecified limb: Secondary | ICD-10-CM

## 2013-08-01 NOTE — Progress Notes (Signed)
Pre visit review using our clinic review tool, if applicable. No additional management support is needed unless otherwise documented below in the visit note. 

## 2013-08-01 NOTE — Patient Instructions (Signed)
-  gentle exercises without weights daily  -tylenol 1000mg  up to three times daily  -topical sports creams with capsacin and menthol  -follow up in 3-4 weeks if persists or sooner if needed

## 2013-08-01 NOTE — Progress Notes (Signed)
Chief Complaint  Patient presents with  . Arm Pain    right    HPI:  Acute visit for:  Arm Pain: -started a few weeks ago - has been lifting little weights  -achy pain in R deltoid and down R anterior arm -advil helped a little -sleeps on this arm -heat helps/can't think of anything that makes worse -no new activities other then above or neck injuries -no hx of neck problems -denies: weakness, numbness, fevers, malaise  ROS: See pertinent positives and negatives per HPI.  Past Medical History  Diagnosis Date  . Hypertension   . Asthma     as a child  . Sleep apnea   . Seasonal allergies     Past Surgical History  Procedure Laterality Date  . Laparoscopic gastric banding  2009  . Cesarean section  1996  . Tonsillectomy and adenoidectomy    . Laparoscopic gastric banding  2009  . Breast surgery  1998    breast reduction    Family History  Problem Relation Age of Onset  . Hyperlipidemia Mother   . Hypertension Mother   . Heart disease      parent   . Hyperlipidemia Father   . Hypertension Father   . Heart disease Father 20    History   Social History  . Marital Status: Married    Spouse Name: N/A    Number of Children: N/A  . Years of Education: N/A   Occupational History  . Airline pilot. Mgr    Social History Main Topics  . Smoking status: Never Smoker   . Smokeless tobacco: None  . Alcohol Use: No  . Drug Use: No  . Sexual Activity: None   Other Topics Concern  . None   Social History Narrative  . None    Current outpatient prescriptions:DIOVAN HCT 80-12.5 MG per tablet, TAKE 1 TABLET BY MOUTH EVERY DAY, Disp: 90 tablet, Rfl: 1;  loratadine (CLARITIN) 10 MG tablet, Take 10 mg by mouth daily., Disp: , Rfl:   EXAM:  Filed Vitals:   08/01/13 1403  BP: 120/80  Temp: 97.8 F (36.6 C)    Body mass index is 37.84 kg/(m^2).  GENERAL: vitals reviewed and listed above, alert, oriented, appears well hydrated and in no acute  distress  HEENT: atraumatic, conjunttiva clear, no obvious abnormalities on inspection of external nose and ears  NECK: no obvious masses on inspection  LUNGS: clear to auscultation bilaterally, no wheezes, rales or rhonchi, good air movement  CV: HRRR, no peripheral edema, normal radial pulses and cap refill UE bilat  MS/NEURO: moves all extremities without noticeable abnormality -normal inspection of neck, shoulders, arms -normal ROM and strength head/neck/UEs bilat -normal sensation to light touch upper ext bilat -TTP in biceps, deltoid and forearms mild in muscles  PSYCH: pleasant and cooperative, no obvious depression or anxiety  ASSESSMENT AND PLAN:  Discussed the following assessment and plan:  Right arm pain  -likely muscular related to weights? Perhaps radiculopathy - but mild if so with no neuro deficits on exam. NV intact with no sig findings on exam. -Patient advised to return or notify a doctor immediately if symptoms worsen or persist or new concerns arise.  Patient Instructions  -gentle exercises without weights daily  -tylenol 1000mg  up to three times daily  -topical sports creams with capsacin and menthol  -follow up in 3-4 weeks if persists or sooner if needed     Gabrielle Moore R.

## 2013-10-10 ENCOUNTER — Telehealth: Payer: Self-pay | Admitting: Family Medicine

## 2013-10-10 MED ORDER — DIOVAN HCT 80-12.5 MG PO TABS: ORAL_TABLET | ORAL | Status: DC

## 2013-10-10 NOTE — Telephone Encounter (Signed)
Pt is requesting new rx diovan hct 80-12.5 mg, sent to [facility] on [address] [phone]

## 2013-10-10 NOTE — Telephone Encounter (Signed)
Rx sent to pharmacy.  Pt has upcoming appointment scheduled.

## 2013-11-01 ENCOUNTER — Telehealth: Payer: Self-pay | Admitting: Family Medicine

## 2013-11-01 NOTE — Telephone Encounter (Signed)
Patient Information:  Caller Name: Sukhmani  Phone: [phone]  Patient: Gabrielle, Moore  Gender: Female  DOB: Aug 09, 1956  Age: 58 Years  PCP: Maudie Mercury (TEXT 1st, after 20 mins can call), [facility])  Office Follow Up:  Does the office need to follow up with this patient?: Yes  Instructions For The Office: Please contact patient with further instructions.  RN Note:  Patient would like to know if she can get a prescription called in for the symptoms like her husband. Please contact patient with further instructions.  Symptoms  Reason For Call & Symptoms: Nausea, vomiting, diarrhea. Reports approximately 3 episodes of vomiting and approximately 4 episodes of diarrhea in last 24 hours. Husband has recently had the same symptoms, his MD prescribed Cipro for his symptoms. Patient is asking for medication to be called in for her symptoms as well.  Reviewed Health History In EMR: Yes  Reviewed Medications In EMR: Yes  Reviewed Allergies In EMR: Yes  Reviewed Surgeries / Procedures: Yes  Date of Onset of Symptoms: 11/01/2013  Guideline(s) Used:  Diarrhea  Disposition Per Guideline:   Home Care  Reason For Disposition Reached:   Mild diarrhea  Advice Given:  N/A  Patient Refused Recommendation:  Patient Requests Prescription  Requesting a prescription for symptoms.

## 2013-11-02 MED ORDER — ONDANSETRON HCL 4 MG PO TABS: 4.0000 mg | ORAL_TABLET | Freq: Three times a day (TID) | ORAL | Status: DC | PRN

## 2013-11-02 NOTE — Telephone Encounter (Signed)
This sounds like a viral illness that is going around and cipro would not help and actually could make it worse. I would advise plenty of fluids with electrolytes (gatorade, soup broth, etc, NO DAIRy , imodium for diarrhea, and can call in some zofran for the nausea/vomiting 4 mg, q 8 hours prn for nausea and vomiting, # 15. If feeling very sick, bloody stools, unable to tol fluids, or otherwise needs to see a doctor.

## 2013-11-02 NOTE — Telephone Encounter (Signed)
Called and spoke with pt and pt states she is very nauseous and pt is sleeping and drinking a lot of water. Pt states she is feeling better but is continuing to have chills.  Rx for Zofran sent to pharmacy. Pt is aware.

## 2013-11-28 ENCOUNTER — Other Ambulatory Visit: Payer: Self-pay

## 2013-11-28 DIAGNOSIS — Z1231 Encounter for screening mammogram for malignant neoplasm of breast: Secondary | ICD-10-CM

## 2013-11-29 ENCOUNTER — Ambulatory Visit (INDEPENDENT_AMBULATORY_CARE_PROVIDER_SITE_OTHER): Payer: 59 | Admitting: Family Medicine

## 2013-11-29 ENCOUNTER — Encounter: Payer: Self-pay | Admitting: Family Medicine

## 2013-11-29 ENCOUNTER — Telehealth: Payer: Self-pay | Admitting: Family Medicine

## 2013-11-29 VITALS — BP 120/74 | Temp 98.6°F | Wt 235.0 lb

## 2013-11-29 DIAGNOSIS — R7309 Other abnormal glucose: Secondary | ICD-10-CM

## 2013-11-29 DIAGNOSIS — Z9884 Bariatric surgery status: Secondary | ICD-10-CM

## 2013-11-29 DIAGNOSIS — I10 Essential (primary) hypertension: Secondary | ICD-10-CM

## 2013-11-29 DIAGNOSIS — R7303 Prediabetes: Secondary | ICD-10-CM

## 2013-11-29 DIAGNOSIS — J329 Chronic sinusitis, unspecified: Secondary | ICD-10-CM

## 2013-11-29 DIAGNOSIS — E669 Obesity, unspecified: Secondary | ICD-10-CM

## 2013-11-29 MED ORDER — DIOVAN HCT 80-12.5 MG PO TABS: ORAL_TABLET | ORAL | Status: DC

## 2013-11-29 NOTE — Progress Notes (Signed)
Pre visit review using our clinic review tool, if applicable. No additional management support is needed unless otherwise documented below in the visit note. 

## 2013-11-29 NOTE — Progress Notes (Signed)
Chief Complaint  Patient presents with  . Follow-up  . Sinusitis    HPI:  Follow up:  1) HTN:  -takes diovan 80-12.5  -reports stable  -denies: CP, SO, DOE, swelling, palpitation   2)OSA:  -saw pulm, working on weight loss   3) Obesity/Prediabetes/HLD:  -working on lifestyle changes: walking daily and working on diet  -wt last visit 236 07/2013  4)Sinus congestion: -started 1-2 weeks ago -symptoms: nasal congestion, PND, HA, cough, sore throat  - went to urgent care and took zpack - sinus pain resolved after zpack -denies: fevers, sinus pain now, NVD, SOB   Lab Results  Component Value Date   CHOL 181 05/31/2013   HDL 50.60 05/31/2013   LDLCALC 113* 05/31/2013   TRIG 86.0 05/31/2013   CHOLHDL 4 05/31/2013   Lab Results  Component Value Date   HGBA1C 5.9 05/31/2013     ROS: See pertinent positives and negatives per HPI.  Past Medical History  Diagnosis Date  . Hypertension   . Asthma     as a child  . Sleep apnea   . Seasonal allergies     Past Surgical History  Procedure Laterality Date  . Laparoscopic gastric banding  2009  . Cesarean section  1996  . Tonsillectomy and adenoidectomy    . Laparoscopic gastric banding  2009  . Breast surgery  1998    breast reduction    Family History  Problem Relation Age of Onset  . Hyperlipidemia Mother   . Hypertension Mother   . Heart disease      parent   . Hyperlipidemia Father   . Hypertension Father   . Heart disease Father 79    History   Social History  . Marital Status: Married    Spouse Name: N/A    Number of Children: N/A  . Years of Education: N/A   Occupational History  . Advertising copywriter. Mgr    Social History Main Topics  . Smoking status: Never Smoker   . Smokeless tobacco: None  . Alcohol Use: No  . Drug Use: No  . Sexual Activity: None   Other Topics Concern  . None   Social History Narrative  . None    Current outpatient prescriptions:DIOVAN HCT 80-12.5 MG per tablet, TAKE 1  TABLET BY MOUTH EVERY DAY, Disp: 30 tablet, Rfl: 11;  loratadine (CLARITIN) 10 MG tablet, Take 10 mg by mouth daily., Disp: , Rfl: ;  ondansetron (ZOFRAN) 4 MG tablet, Take 1 tablet (4 mg total) by mouth every 8 (eight) hours as needed for nausea or vomiting., Disp: 15 tablet, Rfl: 0  EXAM:  Filed Vitals:   11/29/13 1509  BP: 120/74  Temp: 98.6 F (37 C)    Body mass index is 37.37 kg/(m^2).  GENERAL: vitals reviewed and listed above, alert, oriented, appears well hydrated and in no acute distress  HEENT: atraumatic, conjunttiva clear, no obvious abnormalities on inspection of external nose and ears  NECK: no obvious masses on inspection  LUNGS: clear to auscultation bilaterally, no wheezes, rales or rhonchi, good air movement  CV: HRRR, no peripheral edema  MS: moves all extremities without noticeable abnormality  PSYCH: pleasant and cooperative, no obvious depression or anxiety  ASSESSMENT AND PLAN:  Discussed the following assessment and plan:  Hypertension - Plan: DIOVAN HCT 80-12.5 MG per tablet  Obesity  History of laparoscopic adjustable gastric banding, [facility]PS, 01/25/2008.  Prediabetes  Sinusitis  -lifestyle recs -recovering from sinusitis, return precautions discussed -will do  labs at follow up per her request -follow up: physical/prevnetive visit in 4-6 months -Patient advised to return or notify a doctor immediately if symptoms worsen or persist or new concerns arise.  Patient Instructions  We recommend the following healthy lifestyle measures: - eat a healthy diet consisting of lots of vegetables, fruits, beans, nuts, seeds, healthy meats such as white chicken and fish and whole grains.  - avoid fried foods, fast food, processed foods, sodas, red meet and other fattening foods.  - get a least 150 minutes of aerobic exercise per week.   Follow up in: 4-6 months for physical with labs that day - come fasting but drink plenty of water      Gabrielle Moore, [place]

## 2013-11-29 NOTE — Patient Instructions (Signed)
We recommend the following healthy lifestyle measures: - eat a healthy diet consisting of lots of vegetables, fruits, beans, nuts, seeds, healthy meats such as white chicken and fish and whole grains.  - avoid fried foods, fast food, processed foods, sodas, red meet and other fattening foods.  - get a least 150 minutes of aerobic exercise per week.   Follow up in: 4-6 months for physical with labs that day - come fasting but drink plenty of water

## 2013-11-29 NOTE — Telephone Encounter (Signed)
Relevant patient education assigned to patient using Emmi. ° °

## 2014-01-16 ENCOUNTER — Ambulatory Visit
Admission: RE | Admit: 2014-01-16 | Discharge: 2014-01-16 | Disposition: A | Discharge status: Home / Self Care | Payer: 59 | Source: Ambulatory Visit

## 2014-01-16 DIAGNOSIS — Z1231 Encounter for screening mammogram for malignant neoplasm of breast: Secondary | ICD-10-CM

## 2014-05-01 ENCOUNTER — Ambulatory Visit (INDEPENDENT_AMBULATORY_CARE_PROVIDER_SITE_OTHER): Payer: 59 | Admitting: Family Medicine

## 2014-05-01 ENCOUNTER — Encounter: Payer: Self-pay | Admitting: Family Medicine

## 2014-05-01 VITALS — BP 122/88 | HR 67 | Temp 98.1°F | Ht 63.75 in | Wt 232.0 lb

## 2014-05-01 DIAGNOSIS — I10 Essential (primary) hypertension: Secondary | ICD-10-CM

## 2014-05-01 DIAGNOSIS — E669 Obesity, unspecified: Secondary | ICD-10-CM

## 2014-05-01 DIAGNOSIS — G4733 Obstructive sleep apnea (adult) (pediatric): Secondary | ICD-10-CM

## 2014-05-01 DIAGNOSIS — Z23 Encounter for immunization: Secondary | ICD-10-CM

## 2014-05-01 DIAGNOSIS — R739 Hyperglycemia, unspecified: Secondary | ICD-10-CM

## 2014-05-01 DIAGNOSIS — Z0000 Encounter for general adult medical examination without abnormal findings: Secondary | ICD-10-CM

## 2014-05-01 DIAGNOSIS — E7800 Pure hypercholesterolemia, unspecified: Secondary | ICD-10-CM

## 2014-05-01 DIAGNOSIS — R7309 Other abnormal glucose: Secondary | ICD-10-CM

## 2014-05-01 LAB — BASIC METABOLIC PANEL
BUN: 15 mg/dL (ref 6–23)
CHLORIDE: 102 meq/L (ref 96–112)
CO2: 30 mEq/L (ref 19–32)
Calcium: 9.1 mg/dL (ref 8.4–10.5)
Creatinine, Ser: 0.7 mg/dL (ref 0.4–1.2)
GFR: 99.91 mL/min (ref >60.00)
GLUCOSE: 98 mg/dL (ref 70–99)
Potassium: 4.2 mEq/L (ref 3.5–5.1)
SODIUM: 141 meq/L (ref 135–145)

## 2014-05-01 LAB — HEMOGLOBIN A1C: HEMOGLOBIN A1C: 5.9 % (ref 4.6–6.5)

## 2014-05-01 LAB — LIPID PANEL
CHOLESTEROL: 164 mg/dL (ref 0–200)
HDL: 50.6 mg/dL (ref >39.00)
LDL Cholesterol: 99 mg/dL (ref 0–99)
NonHDL: 113.4
Total CHOL/HDL Ratio: 3
Triglycerides: 74 mg/dL (ref 0.0–149.0)
VLDL: 14.8 mg/dL (ref 0.0–40.0)

## 2014-05-01 NOTE — Patient Instructions (Signed)
-  We have ordered labs or studies at this visit. It can take up to 1-2 weeks for results and processing. We will contact you with instructions IF your results are abnormal. Normal results will be released to your [facility]. If you have not heard from [place] or can not find your results in [facility] in 2 weeks please contact our office.  -PLEASE SIGN UP FOR MYCHART TODAY   We recommend the following healthy lifestyle measures: - eat a healthy diet consisting of lots of vegetables, fruits, beans, nuts, seeds, healthy meats such as white chicken and fish and whole grains.  - avoid fried foods, fast food, processed foods, sodas, red meet and other fattening foods.  - get a least 150 minutes of aerobic exercise per week.   Follow up in: 1 year or as needed

## 2014-05-01 NOTE — Progress Notes (Signed)
Pre visit review using our clinic review tool, if applicable. No additional management support is needed unless otherwise documented below in the visit note. 

## 2014-05-01 NOTE — Addendum Note (Signed)
Addended by: Agnes Lawrence on: 05/01/2014 09:04 AM   Modules accepted: Orders

## 2014-05-01 NOTE — Progress Notes (Signed)
No chief complaint on file.   HPI:  Here for CPE:  -Concerns and/or follow up today: none   1) HTN:  -takes diovan hct 80-12.5  -reports stable  -denies: CP, SO, DOE, swelling, palpitation   2)OSA:  -saw pulm, working on weight loss  -snores occ -no daytime somnelence  3) Obesity/Prediabetes/HLD:  -wt last visit 235 07/2013 -s/p lap band -Diet: variety of foods, balance and well rounded, has tried to cut out soft drinks and watching red meat -Exercise: no CV regular exercise -denies polyuria, polydipsia, vision changes  -Taking folic acid, vitamin D or calcium: no  -Diabetes and Dyslipidemia Screening: doing today  -Hx of HTN: no  -Vaccines: UTD  -pap history: sees gyn yearly  -FDLMP: n/a  -sexual activity: yes, female partner, no new partners  -wants STI testing: no  -FH breast, colon or ovarian ca: see FH Last mammogram: UTD Last colon cancer screening: reports does FOBT yearly, refuses colonoscpy  -sees gyn for bone and breast health  -Alcohol, Tobacco, drug use: see social history  Review of Systems - no fevers, unintentional weight loss, vision loss, hearing loss, chest pain, sob, hemoptysis, melena, hematochezia, hematuria, genital discharge, changing or concerning skin lesions, bleeding, bruising, loc, thoughts of self harm or SI  Past Medical History  Diagnosis Date  . Hypertension   . Asthma     as a child  . Sleep apnea   . Seasonal allergies     Past Surgical History  Procedure Laterality Date  . Laparoscopic gastric banding  2009  . Cesarean section  1996  . Tonsillectomy and adenoidectomy    . Laparoscopic gastric banding  2009  . Breast surgery  1998    breast reduction    Family History  Problem Relation Age of Onset  . Hyperlipidemia Mother   . Hypertension Mother   . Heart disease      parent   . Hyperlipidemia Father   . Hypertension Father   . Heart disease Father 66    History   Social History  . Marital Status:  Married    Spouse Name: N/A    Number of Children: N/A  . Years of Education: N/A   Occupational History  . Advertising copywriter. Mgr    Social History Main Topics  . Smoking status: Never Smoker   . Smokeless tobacco: None  . Alcohol Use: No  . Drug Use: No  . Sexual Activity: None   Other Topics Concern  . None   Social History Narrative  . None    Current outpatient prescriptions:DIOVAN HCT 80-12.5 MG per tablet, TAKE 1 TABLET BY MOUTH EVERY DAY, Disp: 30 tablet, Rfl: 11;  loratadine (CLARITIN) 10 MG tablet, Take 10 mg by mouth daily., Disp: , Rfl:   EXAM:  Filed Vitals:   05/01/14 0811  BP: 122/88  Pulse: 67  Temp: 98.1 F (36.7 C)    GENERAL: vitals reviewed and listed below, alert, oriented, appears well hydrated and in no acute distress  HEENT: head atraumatic, PERRLA, normal appearance of eyes, ears, nose and mouth. moist mucus membranes.  NECK: supple, no masses or lymphadenopathy  LUNGS: clear to auscultation bilaterally, no rales, rhonchi or wheeze  CV: HRRR, no peripheral edema or cyanosis, normal pedal pulses  BREAST: declined  ABDOMEN: bowel sounds normal, soft, non tender to palpation, no masses, no rebound or guarding  GU: declined  RECTAL: refused  SKIN: no rash or abnormal lesions  MS: normal gait, moves all extremities normally  NEURO: CN II-XII grossly intact, normal muscle strength and sensation to light touch on extremities  PSYCH: normal affect, pleasant and cooperative  ASSESSMENT AND PLAN:  Discussed the following assessment and plan:  There are no diagnoses linked to this encounter.  -Discussed and advised all [place] preventive services health task force level A and B recommendations for age, sex and risks.  -stool cards given  -tdap today  -seeing gyn for breast, bone and women's preventive care  -declined flu vaccine today  -Advised at least 150 minutes of exercise per week and a healthy diet low in saturated fats and sweets  and consisting of fresh fruits and vegetables, lean meats such as fish and white chicken and whole grains.  -labs, studies and vaccines per orders this encounter  Orders Placed This Encounter  Procedures  . Lipid Panel  . Hemoglobin A1c  . Basic metabolic panel    Patient advised to return to clinic immediately if symptoms worsen or persist or new concerns.  Patient Instructions  -We have ordered labs or studies at this visit. It can take up to 1-2 weeks for results and processing. We will contact you with instructions IF your results are abnormal. Normal results will be released to your [facility]. If you have not heard from [place] or can not find your results in [facility] in 2 weeks please contact our office.  -PLEASE SIGN UP FOR MYCHART TODAY   We recommend the following healthy lifestyle measures: - eat a healthy diet consisting of lots of vegetables, fruits, beans, nuts, seeds, healthy meats such as white chicken and fish and whole grains.  - avoid fried foods, fast food, processed foods, sodas, red meet and other fattening foods.  - get a least 150 minutes of aerobic exercise per week.   Follow up in: 1 year or as needed     Return in about 1 year (around 05/02/2015) for CPE, follow up.  Colin Benton R.

## 2014-05-22 ENCOUNTER — Other Ambulatory Visit (INDEPENDENT_AMBULATORY_CARE_PROVIDER_SITE_OTHER): Payer: 59

## 2014-05-22 ENCOUNTER — Other Ambulatory Visit: Payer: Self-pay | Admitting: Family Medicine

## 2014-05-22 DIAGNOSIS — R195 Other fecal abnormalities: Secondary | ICD-10-CM

## 2014-05-22 LAB — POC HEMOCCULT BLD/STL (HOME/3-CARD/SCREEN)
CARD #3 DATE: NEGATIVE
Card #1 Date: NEGATIVE
Card #2 Date: NEGATIVE
Card #2 Fecal Occult Blod, POC: NEGATIVE
FECAL OCCULT BLD: NEGATIVE
FECAL OCCULT BLD: NEGATIVE

## 2014-07-07 ENCOUNTER — Ambulatory Visit: Payer: 59

## 2014-12-09 ENCOUNTER — Other Ambulatory Visit: Payer: Self-pay | Admitting: Family Medicine

## 2014-12-11 ENCOUNTER — Telehealth: Payer: Self-pay | Admitting: Family Medicine

## 2014-12-11 NOTE — Telephone Encounter (Signed)
Rx done. 

## 2014-12-11 NOTE — Telephone Encounter (Signed)
Pt needs refill on diovan hct 80-12.5mg  #30 with refills to last until 04-2015 appt.

## 2014-12-18 ENCOUNTER — Other Ambulatory Visit: Payer: Self-pay

## 2014-12-18 DIAGNOSIS — Z1231 Encounter for screening mammogram for malignant neoplasm of breast: Secondary | ICD-10-CM

## 2015-01-22 ENCOUNTER — Ambulatory Visit
Admission: RE | Admit: 2015-01-22 | Discharge: 2015-01-22 | Disposition: A | Discharge status: Home / Self Care | Payer: 59 | Source: Ambulatory Visit

## 2015-01-22 DIAGNOSIS — Z1231 Encounter for screening mammogram for malignant neoplasm of breast: Secondary | ICD-10-CM

## 2015-02-26 ENCOUNTER — Encounter: Payer: Self-pay | Admitting: Family Medicine

## 2015-02-26 ENCOUNTER — Ambulatory Visit (INDEPENDENT_AMBULATORY_CARE_PROVIDER_SITE_OTHER): Payer: 59 | Admitting: Family Medicine

## 2015-02-26 VITALS — BP 118/88 | HR 74 | Temp 98.2°F | Ht 63.75 in | Wt 237.0 lb

## 2015-02-26 DIAGNOSIS — M25511 Pain in right shoulder: Secondary | ICD-10-CM

## 2015-02-26 NOTE — Progress Notes (Signed)
Pre visit review using our clinic review tool, if applicable. No additional management support is needed unless otherwise documented below in the visit note. 

## 2015-02-26 NOTE — Patient Instructions (Signed)
BEFORE YOU LEAVE: -xray sheet -rotator cuff exercises  Do the exercise at least 4 days per week  Aleve 1-2 times daily if needed for pain  Heat for 15 minutes twiced

## 2015-02-26 NOTE — Progress Notes (Signed)
HPI:   Acute visit for:  Arm Pain: -started: yesterday after her dog yanked on the leash yesterday - 25lb dog -symptoms: has pain in R lateral shoulder since, mod pain - worse with lifting, advil helped a little, tingling in R fingers -denies: weakness, numbness, fevers, malaise  ROS: See pertinent positives and negatives per HPI.  Past Medical History  Diagnosis Date  . Hypertension   . Asthma     as a child  . Sleep apnea   . Seasonal allergies     Past Surgical History  Procedure Laterality Date  . Laparoscopic gastric banding  2009  . Cesarean section  1996  . Tonsillectomy and adenoidectomy    . Laparoscopic gastric banding  2009  . Breast surgery  1998    breast reduction    Family History  Problem Relation Age of Onset  . Hyperlipidemia Mother   . Hypertension Mother   . Heart disease      parent   . Hyperlipidemia Father   . Hypertension Father   . Heart disease Father 97    History   Social History  . Marital Status: Married    Spouse Name: N/A  . Number of Children: N/A  . Years of Education: N/A   Occupational History  . Advertising copywriter. Mgr    Social History Main Topics  . Smoking status: Never Smoker   . Smokeless tobacco: Not on file  . Alcohol Use: No  . Drug Use: No  . Sexual Activity: Not on file   Other Topics Concern  . None   Social History Narrative     Current outpatient prescriptions:  .  DIOVAN HCT 80-12.5 MG per tablet, TAKE 1 TABLET BY MOUTH EVERY DAY, Disp: 90 tablet, Rfl: 3 .  loratadine (CLARITIN) 10 MG tablet, Take 10 mg by mouth daily., Disp: , Rfl:   EXAM:  Filed Vitals:   02/26/15 1102  BP: 118/88  Pulse: 74  Temp: 98.2 F (36.8 C)    Body mass index is 41.01 kg/(m^2).  GENERAL: vitals reviewed and listed above, alert, oriented, appears well hydrated and in no acute distress  HEENT: atraumatic, conjunttiva clear, no obvious abnormalities on inspection of external nose and ears  NECK: no obvious  masses on inspection, normal ROM head and neck  MS: moves all extremities without noticeable  Normal inspection of the shoulder, no step off lesion TTP diffusely over the deltoid No bony TTP, normal sensation to light touch throughout UEs bilat Normal strength in shoulder and forearm R and L Neg impingment and apprehension test, neg empty can  PSYCH: pleasant and cooperative, no obvious depression or anxiety  ASSESSMENT AND PLAN:  Discussed the following assessment and plan:  Pain in joint, shoulder region, right - Plan: DG Shoulder Right  -suspect soft tissue injury to the deltoid, the mild tingling she is feeling is not explained by this and discussed potential etiologies, normal neuro exam  -sig pain on exam and advised plain film R shoulder though given mechanism of injury doubt cervical radiculopathy or nerve injury -opted for nsaids, heat, HEP -follow up in 4 weeks  -Patient advised to return or notify a doctor immediately if symptoms worsen or persist or new concerns arise.  Patient Instructions  BEFORE YOU LEAVE: -xray sheet -rotator cuff exercises  Do the exercise at least 4 days per week  Aleve 1-2 times daily if needed for pain  Heat for 15 minutes twiced     Colin Benton R.

## 2015-02-27 ENCOUNTER — Ambulatory Visit (INDEPENDENT_AMBULATORY_CARE_PROVIDER_SITE_OTHER)
Admission: RE | Admit: 2015-02-27 | Discharge: 2015-02-27 | Disposition: A | Discharge status: Home / Self Care | Payer: 59 | Source: Ambulatory Visit | Attending: Family Medicine | Admitting: Family Medicine

## 2015-02-27 ENCOUNTER — Encounter: Payer: Self-pay | Admitting: Family Medicine

## 2015-02-27 DIAGNOSIS — M25511 Pain in right shoulder: Secondary | ICD-10-CM

## 2015-02-28 NOTE — Telephone Encounter (Signed)
Left a message for the pt to return my call.  

## 2015-02-28 NOTE — Telephone Encounter (Signed)
Please call pt to let her know: Normally, we would give this type of injury some time to heal. However, given the severity of her pain and that it is worsening, please place referral to ortho - they will help determine if further imaging needed. Also let her know about Raliegh Ip and [place] walk in [place] [facility] if she is unable to wait until her ortho appointment. Thanks.

## 2015-02-28 NOTE — Telephone Encounter (Signed)
I called the pt and informed her of the message below and she stated she would like to see Dr Tamera Punt and she is aware the referral was placed.

## 2015-03-27 ENCOUNTER — Ambulatory Visit: Payer: 59 | Admitting: Family Medicine

## 2015-05-02 ENCOUNTER — Encounter: Payer: 59 | Admitting: Family Medicine

## 2015-05-15 ENCOUNTER — Other Ambulatory Visit: Payer: Self-pay | Admitting: Family Medicine

## 2015-06-11 ENCOUNTER — Other Ambulatory Visit: Payer: Self-pay | Admitting: Family Medicine

## 2015-06-12 ENCOUNTER — Ambulatory Visit (INDEPENDENT_AMBULATORY_CARE_PROVIDER_SITE_OTHER): Payer: Commercial Managed Care - HMO

## 2015-06-12 DIAGNOSIS — Z23 Encounter for immunization: Secondary | ICD-10-CM

## 2015-08-29 ENCOUNTER — Ambulatory Visit (INDEPENDENT_AMBULATORY_CARE_PROVIDER_SITE_OTHER): Payer: Commercial Managed Care - HMO | Admitting: Family Medicine

## 2015-08-29 ENCOUNTER — Encounter: Payer: Self-pay | Admitting: Family Medicine

## 2015-08-29 VITALS — BP 124/82 | HR 100 | Temp 98.3°F | Ht 65.5 in | Wt 239.0 lb

## 2015-08-29 DIAGNOSIS — Z9884 Bariatric surgery status: Secondary | ICD-10-CM

## 2015-08-29 DIAGNOSIS — E669 Obesity, unspecified: Secondary | ICD-10-CM

## 2015-08-29 DIAGNOSIS — Z0000 Encounter for general adult medical examination without abnormal findings: Secondary | ICD-10-CM

## 2015-08-29 DIAGNOSIS — I10 Essential (primary) hypertension: Secondary | ICD-10-CM | POA: Diagnosis not present

## 2015-08-29 LAB — CBC WITH DIFFERENTIAL/PLATELET
BASOS PCT: 0.6 % (ref 0.0–3.0)
Basophils Absolute: 0 10*3/uL (ref 0.0–0.1)
EOS PCT: 3.2 % (ref 0.0–5.0)
Eosinophils Absolute: 0.2 10*3/uL (ref 0.0–0.7)
HEMATOCRIT: 45.8 % (ref 36.0–46.0)
Hemoglobin: 15.2 g/dL — ABNORMAL HIGH (ref 12.0–15.0)
LYMPHS ABS: 2.3 10*3/uL (ref 0.7–4.0)
LYMPHS PCT: 37.3 % (ref 12.0–46.0)
MCHC: 33.2 g/dL (ref 30.0–36.0)
MCV: 89.6 fl (ref 78.0–100.0)
MONOS PCT: 8.7 % (ref 3.0–12.0)
Monocytes Absolute: 0.5 10*3/uL (ref 0.1–1.0)
NEUTROS ABS: 3 10*3/uL (ref 1.4–7.7)
NEUTROS PCT: 50.2 % (ref 43.0–77.0)
PLATELETS: 234 10*3/uL (ref 150.0–400.0)
RBC: 5.11 Mil/uL (ref 3.87–5.11)
RDW: 13 % (ref 11.5–15.5)
WBC: 6 10*3/uL (ref 4.0–10.5)

## 2015-08-29 LAB — BASIC METABOLIC PANEL
BUN: 16 mg/dL (ref 6–23)
CHLORIDE: 104 meq/L (ref 96–112)
CO2: 30 meq/L (ref 19–32)
Calcium: 9.4 mg/dL (ref 8.4–10.5)
Creatinine, Ser: 0.75 mg/dL (ref 0.40–1.20)
GFR: 84.3 mL/min (ref >60.00)
Glucose, Bld: 103 mg/dL — ABNORMAL HIGH (ref 70–99)
Potassium: 4.3 mEq/L (ref 3.5–5.1)
SODIUM: 142 meq/L (ref 135–145)

## 2015-08-29 LAB — LIPID PANEL
CHOLESTEROL: 178 mg/dL (ref 0–200)
HDL: 49.8 mg/dL (ref >39.00)
LDL Cholesterol: 108 mg/dL — ABNORMAL HIGH (ref 0–99)
NonHDL: 127.81
Total CHOL/HDL Ratio: 4
Triglycerides: 101 mg/dL (ref 0.0–149.0)
VLDL: 20.2 mg/dL (ref 0.0–40.0)

## 2015-08-29 MED ORDER — VALSARTAN-HYDROCHLOROTHIAZIDE 80-12.5 MG PO TABS: 1.0000 | ORAL_TABLET | Freq: Every day | ORAL | Status: DC

## 2015-08-29 NOTE — Progress Notes (Signed)
Pre visit review using our clinic review tool, if applicable. No additional management support is needed unless otherwise documented below in the visit note. 

## 2015-08-29 NOTE — Patient Instructions (Signed)
BEFORE YOU LEAVE: -labs -schedule follow up in 6 months  Complete and return the stool cards  -We have ordered labs or studies at this visit. It can take up to 1-2 weeks for results and processing. We will contact you with instructions IF your results are abnormal. Normal results will be released to your [facility]. If you have not heard from [place] or can not find your results in [facility] in 2 weeks please contact our office.  We recommend the following healthy lifestyle measures: - eat a healthy whole foods diet consisting of regular small meals composed of vegetables, fruits, beans, nuts, seeds, healthy meats such as white chicken and fish and whole grains.  - avoid sweets, white starchy foods, fried foods, fast food, processed foods, sodas, red meet and other fattening foods.  - get a least 150-300 minutes of aerobic exercise per week.   Vit D3 [phone] daily  Have a merry Christmas and a Happy 2017!

## 2015-08-29 NOTE — Progress Notes (Signed)
HPI:  Here for CPE:  -Concerns and/or follow up today:   Obesity/HTN: -taking  BP med daily, need refill -Diet: variety of foods, balance and well rounded, larger portion sizes - admits to poor diet an dnot having time to take care of herself -Exercise: no regular exercise  Mild URI for a few days with nasal congestion and PND. No sinus pain, fevers, SOB, DOE.  -Taking folic acid, vitamin D or calcium: no  -Diabetes and Dyslipidemia Screening: FASTING today  -Hx of HTN: no  -Vaccines: UTD  -pap history: does gyn exam year with Dr. Marvel Plan  -wants STI testing (Hep C if born [birth year]): no  -FH breast, colon or ovarian ca: see FH Last mammogram:UTD Last colon cancer screening: refused colonoscopy, does yearly stool cards - cards given today  Breast Ca Risk Assessment: -does this with gyn per her report  -Alcohol, Tobacco, drug use: see social history  Review of Systems - no fevers, unintentional weight loss, vision loss, hearing loss, chest pain, sob, hemoptysis, melena, hematochezia, hematuria, genital discharge, changing or concerning skin lesions, bleeding, bruising, loc, thoughts of self harm or SI  Past Medical History  Diagnosis Date  . Hypertension   . Asthma     as a child  . Sleep apnea   . Seasonal allergies     Past Surgical History  Procedure Laterality Date  . Laparoscopic gastric banding  2009  . Cesarean section  1996  . Tonsillectomy and adenoidectomy    . Laparoscopic gastric banding  2009  . Breast surgery  1998    breast reduction    Family History  Problem Relation Age of Onset  . Hyperlipidemia Mother   . Hypertension Mother   . Heart disease      parent   . Hyperlipidemia Father   . Hypertension Father   . Heart disease Father 13    Social History   Social History  . Marital Status: Married    Spouse Name: N/A  . Number of Children: N/A  . Years of Education: N/A   Occupational History  . Advertising copywriter. Mgr    Social  History Main Topics  . Smoking status: Never Smoker   . Smokeless tobacco: None  . Alcohol Use: No  . Drug Use: No  . Sexual Activity: Not Asked   Other Topics Concern  . None   Social History Narrative     Current outpatient prescriptions:  .  loratadine (CLARITIN) 10 MG tablet, Take 10 mg by mouth daily., Disp: , Rfl:  .  valsartan-hydrochlorothiazide (DIOVAN-HCT) 80-12.5 MG tablet, Take 1 tablet by mouth daily., Disp: 90 tablet, Rfl: 3  EXAM:  Filed Vitals:   08/29/15 0823  BP: 124/82  Pulse: 100  Temp: 98.3 F (36.8 C)    GENERAL: vitals reviewed and listed below, alert, oriented, appears well hydrated and in no acute distress  HEENT: head atraumatic, PERRLA, normal appearance of eyes, ears, nose and mouth. moist mucus membranes.normal appearance of ear canals and TMs, clear nasal congestion, mild post oropharyngeal erythema with PND, no tonsillar edema or exudate, no sinus TTP  NECK: supple, no masses or lymphadenopathy  LUNGS: clear to auscultation bilaterally, no rales, rhonchi or wheeze  CV: HRRR, no peripheral edema or cyanosis, normal pedal pulses  BREAST: declined  ABDOMEN: bowel sounds normal, soft, non tender to palpation, no masses, no rebound or guarding  GU: declined  SKIN: no rash or abnormal lesions - declined full skin check  MS: normal gait,  moves all extremities normally  NEURO: CN II-XII grossly intact, normal muscle strength and sensation to light touch on extremities  PSYCH: normal affect, pleasant and cooperative  ASSESSMENT AND PLAN:  Discussed the following assessment and plan:  Visit for preventive health examination - Plan: CBC with Differential  Essential hypertension - Plan: Basic metabolic panel  Obesity - Plan: Lipid Panel  History of laparoscopic adjustable gastric banding, APS, 01/25/2008.   -Discussed and advised all [place] preventive services health task force level A and B recommendations for age, sex and  risks.  -Advised at least 150 minutes of exercise per week and a healthy diet low in saturated fats and sweets and consisting of fresh fruits and vegetables, lean meats such as fish and white chicken and whole grains.  -supportive care recs for VURI with return precuations  -FASTING labs, studies and vaccines per orders this encounter  Orders Placed This Encounter  Procedures  . Lipid Panel  . Basic metabolic panel  . CBC with Differential    Patient advised to return to clinic immediately if symptoms worsen or persist or new concerns.  Patient Instructions  BEFORE YOU LEAVE: -labs -schedule follow up in 6 months  Complete and return the stool cards  -We have ordered labs or studies at this visit. It can take up to 1-2 weeks for results and processing. We will contact you with instructions IF your results are abnormal. Normal results will be released to your [facility]. If you have not heard from [place] or can not find your results in [facility] in 2 weeks please contact our office.  We recommend the following healthy lifestyle measures: - eat a healthy whole foods diet consisting of regular small meals composed of vegetables, fruits, beans, nuts, seeds, healthy meats such as white chicken and fish and whole grains.  - avoid sweets, white starchy foods, fried foods, fast food, processed foods, sodas, red meet and other fattening foods.  - get a least 150-300 minutes of aerobic exercise per week.   Vit D3 [phone] IU daily  Have a merry Christmas and a Happy 2017!         No Follow-up on file.  Colin Benton R.

## 2015-09-07 ENCOUNTER — Ambulatory Visit (INDEPENDENT_AMBULATORY_CARE_PROVIDER_SITE_OTHER)
Admission: RE | Admit: 2015-09-07 | Discharge: 2015-09-07 | Disposition: A | Discharge status: Home / Self Care | Payer: Commercial Managed Care - HMO | Source: Ambulatory Visit | Attending: Family Medicine | Admitting: Family Medicine

## 2015-09-07 ENCOUNTER — Encounter: Payer: Self-pay | Admitting: Family Medicine

## 2015-09-07 ENCOUNTER — Ambulatory Visit (INDEPENDENT_AMBULATORY_CARE_PROVIDER_SITE_OTHER): Payer: Commercial Managed Care - HMO | Admitting: Family Medicine

## 2015-09-07 VITALS — BP 122/82 | HR 108 | Temp 97.7°F | Ht 65.5 in

## 2015-09-07 DIAGNOSIS — R059 Cough, unspecified: Secondary | ICD-10-CM

## 2015-09-07 DIAGNOSIS — R0902 Hypoxemia: Secondary | ICD-10-CM

## 2015-09-07 DIAGNOSIS — R05 Cough: Secondary | ICD-10-CM | POA: Diagnosis not present

## 2015-09-07 DIAGNOSIS — J989 Respiratory disorder, unspecified: Secondary | ICD-10-CM | POA: Diagnosis not present

## 2015-09-07 LAB — POCT INFLUENZA A/B
INFLUENZA B, POC: NEGATIVE
Influenza A, POC: NEGATIVE

## 2015-09-07 MED ORDER — DOXYCYCLINE HYCLATE 100 MG PO TABS: 100.0000 mg | ORAL_TABLET | Freq: Two times a day (BID) | ORAL | Status: DC

## 2015-09-07 MED ORDER — PREDNISONE 10 MG PO TABS: ORAL_TABLET | ORAL | Status: DC

## 2015-09-07 MED ORDER — HYDROCODONE-HOMATROPINE 5-1.5 MG/5ML PO SYRP: 5.0000 mL | ORAL_SOLUTION | Freq: Three times a day (TID) | ORAL | Status: DC | PRN

## 2015-09-07 NOTE — Patient Instructions (Signed)
BEFORE YOU LEAVE: -xray sheet -flu test  Go to get xray now  Start the antibiotic (doxycycline) and prednisone today  Seek care immediately if worsening of the weekend or if not improving on treatment  Ensure plenty of fluids with electrolytes

## 2015-09-07 NOTE — Progress Notes (Signed)
HPI:  Resp infection: -started: reports started 2 weeks ago as a cold but has gotten worse the last 3 days -symptoms:nasal congestion, sore throat, cough, PND, wheezy at times, felt like had fever, chills, watery diarrhea once today -denies:SOB, NVD, tooth pain -has tried: OTC options, tylenol -sick contacts/travel/risks: denies flu exposure, tick exposure or or Ebola risks -Hx of: allergies, asthma as a child -assures me she can take hycodan cough medication with hydrocodone and request this for cough at night  ROS: See pertinent positives and negatives per HPI.  Past Medical History  Diagnosis Date  . Hypertension   . Asthma     as a child  . Sleep apnea   . Seasonal allergies     Past Surgical History  Procedure Laterality Date  . Laparoscopic gastric banding  2009  . Cesarean section  1996  . Tonsillectomy and adenoidectomy    . Laparoscopic gastric banding  2009  . Breast surgery  1998    breast reduction    Family History  Problem Relation Age of Onset  . Hyperlipidemia Mother   . Hypertension Mother   . Heart disease      parent   . Hyperlipidemia Father   . Hypertension Father   . Heart disease Father 3    Social History   Social History  . Marital Status: Married    Spouse Name: N/A  . Number of Children: N/A  . Years of Education: N/A   Occupational History  . Advertising copywriter. Mgr    Social History Main Topics  . Smoking status: Never Smoker   . Smokeless tobacco: None  . Alcohol Use: No  . Drug Use: No  . Sexual Activity: Not Asked   Other Topics Concern  . None   Social History Narrative     Current outpatient prescriptions:  .  loratadine (CLARITIN) 10 MG tablet, Take 10 mg by mouth daily., Disp: , Rfl:  .  valsartan-hydrochlorothiazide (DIOVAN-HCT) 80-12.5 MG tablet, Take 1 tablet by mouth daily., Disp: 90 tablet, Rfl: 3 .  doxycycline (VIBRA-TABS) 100 MG tablet, Take 1 tablet (100 mg total) by mouth 2 (two) times daily., Disp: 20  tablet, Rfl: 0 .  HYDROcodone-homatropine (HYCODAN) 5-1.5 MG/5ML syrup, Take 5 mLs by mouth every 8 (eight) hours as needed for cough., Disp: 120 mL, Rfl: 0 .  predniSONE (DELTASONE) 10 MG tablet, 40mg  (4 tabs) x2 days, then 30mg  (3 tabs) x 2 days, then 20mg  (2 tabs) x 2 days, then 10 mg (1 tab) x 2 days, Disp: 20 tablet, Rfl: 0  EXAM:  Filed Vitals:   09/07/15 1438  BP: 122/82  Pulse: 108  Temp: 97.7 F (36.5 C)    There is no weight on file to calculate BMI.  GENERAL: vitals reviewed and listed above, alert, oriented, appears well hydrated and in no acute distress  HEENT: atraumatic, conjunttiva clear, no obvious abnormalities on inspection of external nose and ears, normal appearance of ear canals and TMs, clear nasal congestion, mild post oropharyngeal erythema with PND, no tonsillar edema or exudate, no sinus TTP  NECK: no obvious masses on inspection  LUNGS: clear to auscultation bilaterally, no wheezes, rales or rhonchi, good air movement  CV: HRRR, no peripheral edema  MS: moves all extremities without noticeable abnormality  PSYCH: pleasant and cooperative, no obvious depression or anxiety  ASSESSMENT AND PLAN:  Discussed the following assessment and plan:  Respiratory illness - Plan: DG Chest 2 View, predniSONE (DELTASONE) 10 MG tablet,  doxycycline (VIBRA-TABS) 100 MG tablet  Hypoxia - Plan: DG Chest 2 View, predniSONE (DELTASONE) 10 MG tablet  Cough - Plan: DG Chest 2 View  -We discussed potential etiologies, with resp infection being most likely.We discussed treatment side effects, likely course, antibiotic misuse, transmission, and signs of developing a serious illness. O2 93-97% on RA. Opted for CXR to eval for CAP, tx regardless with doxy and prednisone. Low threshold for re-eval and advised of UCC and emergency options over the long weekend. -of course, we advised to return or notify a doctor immediately if symptoms worsen or persist or new concerns  arise.    Patient Instructions  BEFORE YOU LEAVE: -xray sheet -flu test  Go to get xray now  Start the antibiotic (doxycycline) and prednisone today  Seek care immediately if worsening of the weekend or if not improving on treatment  Ensure plenty of fluids with electrolytes     Katlyne Nishida R.

## 2015-09-07 NOTE — Addendum Note (Signed)
Addended by: Agnes Lawrence on: 09/07/2015 03:23 PM   Modules accepted: Orders

## 2015-09-07 NOTE — Progress Notes (Signed)
Pre visit review using our clinic review tool, if applicable. No additional management support is needed unless otherwise documented below in the visit note. Patient declines weight measurement today. 

## 2015-09-10 ENCOUNTER — Other Ambulatory Visit: Payer: Self-pay | Admitting: Family Medicine

## 2015-09-13 ENCOUNTER — Telehealth: Payer: Self-pay | Admitting: Family Medicine

## 2015-09-13 NOTE — Telephone Encounter (Signed)
Patient is aware of Dr Julianne Rice recommendations.

## 2015-09-13 NOTE — Telephone Encounter (Signed)
Patient Name: Gabrielle Moore  DOB: 04-Oct-1955    Initial Comment Caller states she has a continual cough; headache; blowing her nose;   Nurse Assessment  Nurse: Raphael Gibney, RN, Vera Date/Time (Eastern Time): 09/13/2015 12:57:22 PM  Confirm and document reason for call. If symptomatic, describe symptoms. ---Caller states she has been sick since Dec 16. She was seen last Friday and Dr. Maudie Mercury told her to go to the ER on Monday if she was not better. She is coughing. She has to hold a pillow on her ribs due to pain when she coughs. She prescribed her prednisone and she is on her last 2 days of prednisone Oxygen level 93% at the office. . She is wheezing. No fever. Cough is nonproductive.  Has the patient traveled out of the country within the last 30 days? ---No  Does the patient have any new or worsening symptoms? ---Yes  Will a triage be completed? ---Yes  Related visit to physician within the last 2 weeks? ---Yes  Does the PT have any chronic conditions? (i.e. diabetes, asthma, etc.) ---No  Is this a behavioral health or substance abuse call? ---No     Guidelines    Guideline Title Affirmed Question Affirmed Notes  Cough - Acute Non-Productive Wheezing is present    Final Disposition User   See Physician within 4 Hours (or PCP triage) Raphael Gibney, RN, Vera    Comments  No appts available at [facility]. Pt does not want to go to urgent care or the ER. She would like to see Dr. Maudie Mercury in the am or have another medication prescribed. Please call pt back regarding appt or medication.  Pt did not go to ER as instructed on Monday.   Referrals  GO TO FACILITY REFUSED   Disagree/Comply: Disagree  Disagree/Comply Reason: Disagree with instructions

## 2015-09-13 NOTE — Telephone Encounter (Signed)
We also prescribed a strong antibiotic (doxycycline) - did she take it?? If feeling that bad despite abx and prednisone does need eval today a and advise ED. Should be feeling better by now with abx and prednisone.

## 2015-09-16 ENCOUNTER — Encounter (HOSPITAL_COMMUNITY): Payer: Self-pay

## 2015-09-16 ENCOUNTER — Emergency Department (HOSPITAL_COMMUNITY)
Admission: EM | Admit: 2015-09-16 | Discharge: 2015-09-16 | Disposition: A | Discharge status: Home / Self Care | Payer: Commercial Managed Care - HMO | Attending: Emergency Medicine | Admitting: Emergency Medicine

## 2015-09-16 DIAGNOSIS — Z8669 Personal history of other diseases of the nervous system and sense organs: Secondary | ICD-10-CM | POA: Diagnosis not present

## 2015-09-16 DIAGNOSIS — Z792 Long term (current) use of antibiotics: Secondary | ICD-10-CM | POA: Diagnosis not present

## 2015-09-16 DIAGNOSIS — Z79899 Other long term (current) drug therapy: Secondary | ICD-10-CM | POA: Diagnosis not present

## 2015-09-16 DIAGNOSIS — R059 Cough, unspecified: Secondary | ICD-10-CM

## 2015-09-16 DIAGNOSIS — R51 Headache: Secondary | ICD-10-CM | POA: Insufficient documentation

## 2015-09-16 DIAGNOSIS — J45909 Unspecified asthma, uncomplicated: Secondary | ICD-10-CM | POA: Insufficient documentation

## 2015-09-16 DIAGNOSIS — R05 Cough: Secondary | ICD-10-CM | POA: Diagnosis present

## 2015-09-16 DIAGNOSIS — R0981 Nasal congestion: Secondary | ICD-10-CM | POA: Insufficient documentation

## 2015-09-16 DIAGNOSIS — R0781 Pleurodynia: Secondary | ICD-10-CM | POA: Diagnosis not present

## 2015-09-16 DIAGNOSIS — I10 Essential (primary) hypertension: Secondary | ICD-10-CM | POA: Diagnosis not present

## 2015-09-16 MED ORDER — HYDROCOD POLST-CPM POLST ER 10-8 MG/5ML PO SUER: 5.0000 mL | Freq: Two times a day (BID) | ORAL | Status: DC

## 2015-09-16 NOTE — ED Provider Notes (Signed)
CSN: [id number]     Arrival date & time 09/16/15  1823 History  By signing my name below, I, Stephania Fragmin, attest that this documentation has been prepared under the direction and in the presence of [employer], PA-C. Electronically Signed: Stephania Fragmin, ED Scribe. 09/16/2015. 1:46 AM.     Chief Complaint  Patient presents with  . Cough   The history is provided by the patient. No language interpreter was used.    HPI Comments: Gabrielle Moore is a 60 y.o. female with a history of HTN, who presents to the Emergency Department complaining of a constant, worsening, severe dry cough that began about 3 weeks ago on 08/23/15. Associated symptoms include yellow nasal congestion and headache. Patient had seen her PCP on 08/29/15 for the same symptoms and was diagnosed with bronchitis. She was instructed to come to the ED if her cough persists or worsens, and she states that while her cough is still dry, she has had worsening bilateral rib pain whenever she coughs and has to "hold pillows whenever I cough" secondary to her discomfort. Patient was given prednisone, doxycycline, and Hycodan, all of which she has finished. Patient had a CXR on 09/07/15 that was negative, but she states she is concerned her cough may worsen and become pneumonia. She denies a history of DM, HLD, pulmonary conditions, or CA. She denies a history of smoking. Patient has known allergies to codeine. Patient states she had a flu vaccine in October 2016, although she states she was negative for influenza when she saw her PCP.    Past Medical History  Diagnosis Date  . Hypertension   . Asthma     as a child  . Sleep apnea   . Seasonal allergies    Past Surgical History  Procedure Laterality Date  . Laparoscopic gastric banding  2009  . Cesarean section  1996  . Tonsillectomy and adenoidectomy    . Laparoscopic gastric banding  2009  . Breast surgery  1998    breast reduction   Family History  Problem Relation Age of  Onset  . Hyperlipidemia Mother   . Hypertension Mother   . Heart disease      parent   . Hyperlipidemia Father   . Hypertension Father   . Heart disease Father 67   Social History  Substance Use Topics  . Smoking status: Never Smoker   . Smokeless tobacco: None  . Alcohol Use: No   OB History    No data available     Review of Systems A complete 10 system review of systems was obtained and all systems are negative except as noted in the HPI and PMH.    Allergies  Codeine  Home Medications   Prior to Admission medications   Medication Sig Start Date End Date Taking? Authorizing Provider  chlorpheniramine-HYDROcodone (TUSSIONEX PENNKINETIC ER) 10-8 MG/5ML SUER Take 5 mLs by mouth 2 (two) times daily. 09/16/15   Seleen Walter Patel-Mills, PA-C  DIOVAN HCT 80-12.5 MG tablet TAKE 1 TABLET BY MOUTH EVERY DAY 09/11/15   Lucretia Kern, DO  doxycycline (VIBRA-TABS) 100 MG tablet Take 1 tablet (100 mg total) by mouth 2 (two) times daily. 09/07/15   Lucretia Kern, DO  HYDROcodone-homatropine (HYCODAN) 5-1.5 MG/5ML syrup Take 5 mLs by mouth every 8 (eight) hours as needed for cough. 09/07/15   Lucretia Kern, DO  loratadine (CLARITIN) 10 MG tablet Take 10 mg by mouth daily.    Historical Provider, MD  predniSONE (  DELTASONE) 10 MG tablet 40mg  (4 tabs) x2 days, then 30mg  (3 tabs) x 2 days, then 20mg  (2 tabs) x 2 days, then 10 mg (1 tab) x 2 days 09/07/15   Lucretia Kern, DO  valsartan-hydrochlorothiazide (DIOVAN-HCT) 80-12.5 MG tablet Take 1 tablet by mouth daily. 08/29/15   Lucretia Kern, DO   BP 147/87 mmHg  Pulse 82  Temp(Src) 97.5 F (36.4 C) (Oral)  Resp 18  SpO2 98% Physical Exam  Constitutional: She is oriented to person, place, and time. She appears well-developed and well-nourished. No distress.  HENT:  Head: Normocephalic and atraumatic.  Eyes: Conjunctivae and EOM are normal.  Neck: Neck supple. No tracheal deviation present.  Cardiovascular: Normal rate, regular rhythm and normal heart  sounds.   No murmur heard. Pulmonary/Chest: Effort normal and breath sounds normal. No accessory muscle usage. No respiratory distress. She has no decreased breath sounds. She has no wheezes. She has no rales. She exhibits no tenderness.  Lungs are completely clear to auscultation bilaterally. No wheezing or decreased breath sounds. No respiratory distress or use of accessory muscles. 99% oxygen on RA.   Musculoskeletal: Normal range of motion.  Neurological: She is alert and oriented to person, place, and time.  Skin: Skin is warm and dry.  Psychiatric: She has a normal mood and affect. Her behavior is normal.  Nursing note and vitals reviewed.   ED Course  Procedures (including critical care time)  DIAGNOSTIC STUDIES: Oxygen Saturation is 99% on RA, normal by my interpretation.    COORDINATION OF CARE: 8:55 PM - Discussed treatment plan with pt at bedside. Pt verbalized understanding and agreed to plan.   MDM   Final diagnoses:  Cough   Patients symptoms are consistent with bronchitis. Doubt pneumonia. She is 98% oxygen on room air. Afebrile. No respiratory distress. Her exam is completely benign. I do not believe she needs imaging at this time. Pt reassured. Pt will be discharged with symptomatic treatment, including Tussionex. I discussed taking 2.5 mL's for first dose and then increasing to 76mL depending on how she feels.  She says she has a codeine allergy but only to pills.  She has been prescribed hycodan recently which has codeine and has had no problems with this. I discussed following up with her PCP within 48 hours. Strict return precautions, and pt instructed to return if she develops fever, productive cough, or hemoptysis, SOB. Pt verbalized understanding and is agreeable with plan. Pt is hemodynamically stable & in NAD prior to dc.  I personally performed the services described in this documentation, which was scribed in my presence. The recorded information has been  reviewed and is accurate.     Ottie Glazier, PA-C 09/17/15 0146  Lacretia Leigh, MD 09/19/15 [phone]

## 2015-09-16 NOTE — Discharge Instructions (Signed)
Cough, Adult Follow up with your primary care physician in 48 hours.  Take medication twice daily.  This medication will make you sleepy so do not use it before you drive or work.  Take 2.5 mL the first night to see how you tolerate the medication.  A cough helps to clear your throat and lungs. A cough may last only 2-3 weeks (acute), or it may last longer than 8 weeks (chronic). Many different things can cause a cough. A cough may be a sign of an illness or another medical condition. HOME CARE  Pay attention to any changes in your cough.  Take medicines only as told by your doctor.  If you were prescribed an antibiotic medicine, take it as told by your doctor. Do not stop taking it even if you start to feel better.  Talk with your doctor before you try using a cough medicine.  Drink enough fluid to keep your pee (urine) clear or pale yellow.  If the air is dry, use a cold steam vaporizer or humidifier in your home.  Stay away from things that make you cough at work or at home.  If your cough is worse at night, try using extra pillows to raise your head up higher while you sleep.  Do not smoke, and try not to be around smoke. If you need help quitting, ask your doctor.  Do not have caffeine.  Do not drink alcohol.  Rest as needed. GET HELP IF:  You have new problems (symptoms).  You cough up yellow fluid (pus).  Your cough does not get better after 2-3 weeks, or your cough gets worse.  Medicine does not help your cough and you are not sleeping well.  You have pain that gets worse or pain that is not helped with medicine.  You have a fever.  You are losing weight and you do not know why.  You have night sweats. GET HELP RIGHT AWAY IF:  You cough up blood.  You have trouble breathing.  Your heartbeat is very fast.   This information is not intended to replace advice given to you by your health care provider. Make sure you discuss any questions you have with your  health care provider.   Document Released: 05/08/2011 Document Revised: 05/16/2015 Document Reviewed: 11/01/2014 Elsevier Interactive Patient Education [employer].

## 2015-09-16 NOTE — ED Notes (Signed)
Patient states she saw her PCP for cough and was diagnosed with bronchitis. Patient states she was told to come to the ED if the cough did not get any better. Patient continues to have a frequent dry hacking cough. Patient also c/o sinus drainage yellow in color. Patient states she has expiratory wheezing a times.

## 2015-10-04 ENCOUNTER — Other Ambulatory Visit: Payer: Commercial Managed Care - HMO

## 2015-12-27 LAB — HM PAP SMEAR: HM Pap smear: NEGATIVE

## 2016-01-03 ENCOUNTER — Encounter: Payer: Self-pay | Admitting: Family Medicine

## 2016-01-24 ENCOUNTER — Encounter: Payer: Self-pay | Admitting: Family Medicine

## 2016-01-28 ENCOUNTER — Telehealth: Payer: Self-pay | Admitting: Family Medicine

## 2016-01-28 ENCOUNTER — Ambulatory Visit (INDEPENDENT_AMBULATORY_CARE_PROVIDER_SITE_OTHER): Payer: Commercial Managed Care - HMO | Admitting: Family Medicine

## 2016-01-28 ENCOUNTER — Encounter: Payer: Self-pay | Admitting: Family Medicine

## 2016-01-28 VITALS — BP 100/76 | HR 88 | Temp 98.4°F | Ht 65.5 in | Wt 242.5 lb

## 2016-01-28 DIAGNOSIS — E669 Obesity, unspecified: Secondary | ICD-10-CM | POA: Diagnosis not present

## 2016-01-28 DIAGNOSIS — R0683 Snoring: Secondary | ICD-10-CM | POA: Diagnosis not present

## 2016-01-28 DIAGNOSIS — G4733 Obstructive sleep apnea (adult) (pediatric): Secondary | ICD-10-CM | POA: Diagnosis not present

## 2016-01-28 DIAGNOSIS — I10 Essential (primary) hypertension: Secondary | ICD-10-CM | POA: Diagnosis not present

## 2016-01-28 NOTE — Patient Instructions (Signed)
Before you leave: -Schedule routine follow-up in 3-4 months  -We placed a referral for sleep apnea testing and treatment as requested. It usually takes about 1-2 weeks to process and schedule this referral. If you have not heard from [place] regarding this appointment in 2 weeks please contact our office.  -We do advise a healthy diet and regular exercise/weight reduction as this can also help with sleep apnea.  We recommend the following healthy lifestyle measures: - eat a healthy whole foods diet consisting of regular small meals composed of vegetables, fruits, beans, nuts, seeds, healthy meats such as white chicken and fish and whole grains.  - avoid sweets, white starchy foods, fried foods, fast food, processed foods, sodas, red meet  - get a least 150-300 minutes of aerobic exercise per week.

## 2016-01-28 NOTE — Progress Notes (Signed)
Pre visit review using our clinic review tool, if applicable. No additional management support is needed unless otherwise documented below in the visit note. 

## 2016-01-28 NOTE — Telephone Encounter (Signed)
I called the pt and she stated she was told the first available appt to see a pulmonologist is August and she wanted to know if she could be referred to someone else? 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

## 2016-01-28 NOTE — Telephone Encounter (Signed)
Please let pt know, can have referral coordinator try other offices. [facility] as she actually saw them a few years ago for this. Please let debbie know.

## 2016-01-28 NOTE — Telephone Encounter (Signed)
Pt would like for you to give her call in reference to today's visit state that it is very complicated and only wanted to talk with you about.

## 2016-01-28 NOTE — Progress Notes (Signed)
HPI:  Acute visit for sleep issues: -chronic -Symptoms include: Snoring, poor sleep, does not feel rested when she wakes up, daytime somnolence - reports falls asleep in car on lunch breaks -Reports diagnosis of obstructive sleep apnea remotely, cannot remember when or where she had a sleep study, but was greater than 5 years ago -Reports had CPAP machine, but she did not feel she needed it and lost it during a move -History tonsillectomy as a child -seen by Dr. Gwenette Greet in the past for this on review of chart, but has been several years, seems symptoms have progressed since then from review of notes  ROS: See pertinent positives and negatives per HPI.  Past Medical History  Diagnosis Date  . Hypertension   . Asthma     as a child  . Sleep apnea   . Seasonal allergies     Past Surgical History  Procedure Laterality Date  . Laparoscopic gastric banding  2009  . Cesarean section  1996  . Tonsillectomy and adenoidectomy    . Laparoscopic gastric banding  2009  . Breast surgery  1998    breast reduction    Family History  Problem Relation Age of Onset  . Hyperlipidemia Mother   . Hypertension Mother   . Heart disease      parent   . Hyperlipidemia Father   . Hypertension Father   . Heart disease Father 81    Social History   Social History  . Marital Status: Married    Spouse Name: N/A  . Number of Children: N/A  . Years of Education: N/A   Occupational History  . Advertising copywriter. Mgr    Social History Main Topics  . Smoking status: Never Smoker   . Smokeless tobacco: None  . Alcohol Use: No  . Drug Use: No  . Sexual Activity: Not Asked   Other Topics Concern  . None   Social History Narrative     Current outpatient prescriptions:  .  loratadine (CLARITIN) 10 MG tablet, Take 10 mg by mouth daily., Disp: , Rfl:  .  valsartan-hydrochlorothiazide (DIOVAN-HCT) 80-12.5 MG tablet, Take 1 tablet by mouth daily., Disp: 90 tablet, Rfl: 3  EXAM:  Filed Vitals:    01/28/16 1036  BP: 100/76  Pulse: 88  Temp: 98.4 F (36.9 C)    Body mass index is 39.73 kg/(m^2).  GENERAL: vitals reviewed and listed above, alert, oriented, appears well hydrated and in no acute distress  HEENT: atraumatic, conjunttiva clear, no obvious abnormalities on inspection of external nose and ears  NECK: no obvious masses on inspection  LUNGS: clear to auscultation bilaterally, no wheezes, rales or rhonchi, good air movement  CV: HRRR, no peripheral edema  MS: moves all extremities without noticeable abnormality  PSYCH: pleasant and cooperative, no obvious depression or anxiety  ASSESSMENT AND PLAN:  Discussed the following assessment and plan:  OSA (obstructive sleep apnea) - Plan: Ambulatory referral to Pulmonology  Snoring - Plan: Ambulatory referral to Pulmonology  Obesity  Essential hypertension  -I don't see sleep study results in chart and seems like has been a long time since she had one, 2008 per prior pulm notes -she saw Dr. Gwenette Greet for this in 2014, will have her follow up with pulm, referral placed given in has been several years -advised of options for treatment of OSA -advised working on weight loss in interim and regardless of other treatments, but sound like she may need CPAP -Patient advised to return or notify a  doctor immediately if symptoms worsen or persist or new concerns arise.  Patient Instructions  Before you leave: -Schedule routine follow-up in 3-4 months  -We placed a referral for sleep apnea testing and treatment as requested. It usually takes about 1-2 weeks to process and schedule this referral. If you have not heard from [place] regarding this appointment in 2 weeks please contact our office.  -We do advise a healthy diet and regular exercise/weight reduction as this can also help with sleep apnea.  We recommend the following healthy lifestyle measures: - eat a healthy whole foods diet consisting of regular small meals  composed of vegetables, fruits, beans, nuts, seeds, healthy meats such as white chicken and fish and whole grains.  - avoid sweets, white starchy foods, fried foods, fast food, processed foods, sodas, red meet  - get a least 150-300 minutes of aerobic exercise per week.       Colin Benton R.

## 2016-01-29 NOTE — Telephone Encounter (Signed)
Per [facility]  pulmonary  [id number] they do not have anything sooner  Than 04-18-2016  For consultation . States that the patient can check back daily for cancellation - do not have a cancellation list .

## 2016-01-29 NOTE — Telephone Encounter (Signed)
Deborah-can you help with this?

## 2016-01-29 NOTE — Telephone Encounter (Signed)
Please see notes below. I think she is requesting referral to outside sleep specialist if the [facility] is not able to work her in sooner. Can we check on availability with Dr. Roddie Mc? Thanks.

## 2016-01-31 ENCOUNTER — Telehealth: Payer: Self-pay | Admitting: *Deleted

## 2016-01-31 ENCOUNTER — Other Ambulatory Visit: Payer: Self-pay | Admitting: *Deleted

## 2016-01-31 DIAGNOSIS — G4733 Obstructive sleep apnea (adult) (pediatric): Secondary | ICD-10-CM

## 2016-01-31 NOTE — Telephone Encounter (Signed)
Patient called back and stated no one has called back regarding an appt for the sleep study.  I advised her per Neoma Laming our referral coordinator, the order was entered to see if Dr Roddie Mc can see her and she was given their phone number of [phone] to call for appt info.  She stated if they cannot see her soon she will call back with information for a doctor in [facility] that she found out about.

## 2016-02-18 ENCOUNTER — Encounter: Payer: Self-pay | Admitting: Neurology

## 2016-02-18 ENCOUNTER — Ambulatory Visit (INDEPENDENT_AMBULATORY_CARE_PROVIDER_SITE_OTHER): Payer: Commercial Managed Care - HMO | Admitting: Neurology

## 2016-02-18 VITALS — BP 130/88 | HR 72 | Resp 20 | Ht 66.0 in | Wt 235.0 lb

## 2016-02-18 DIAGNOSIS — G4710 Hypersomnia, unspecified: Secondary | ICD-10-CM

## 2016-02-18 DIAGNOSIS — G4733 Obstructive sleep apnea (adult) (pediatric): Secondary | ICD-10-CM | POA: Diagnosis not present

## 2016-02-18 DIAGNOSIS — G4730 Sleep apnea, unspecified: Secondary | ICD-10-CM | POA: Diagnosis not present

## 2016-02-18 DIAGNOSIS — R0683 Snoring: Secondary | ICD-10-CM | POA: Diagnosis not present

## 2016-02-18 NOTE — Progress Notes (Signed)
SLEEP MEDICINE CLINIC   Provider:  Larey Seat, M D  Referring Provider: Lucretia Kern, DO Primary Care Physician:  Lucretia Kern., DO  Chief Complaint  Patient presents with  . New Patient (Initial Visit)    snoring, diagnosed with osa, has been on cpap, but cpap was lost, rm 11, alone    HPI:  Gabrielle Moore is a 59 y.o. female , seen here as a referral from Dr. Maudie Mercury for a sleep medicine consultation,   [name] had undergone a sleep study in 2008 or 2009, and was placed on CPAP. In a move 2014 she lost the CPAP and needs a replacement.  Her family has reported that she snores very loudly, her husband even found no relief in using earplugs. 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 She has not woken herself choking for air or gasping, but she does have nocturia. She feels tired and ready to go to bed after work. And she does fall asleep rather promptly and endorsed the daytime Epworth sleepiness score at 14 points. Recently she drove from [facility] to [place] but had to take a stop and the 50 minute power nap before she completed the journey.   Chief complaint according to patient : Recently when the patient had to overcome an upper airway respiratory infection she was forced to mouth breathe and was snoring even louder than usual, this caused the referral for Evaluation.  Sleep habits are as follows:  She reports that after work she may visit her mother, and will stay at her home for about 5 hours. She feels exhausted as a Land. She will return to her own home by about 9 PM and is in bed by 10 PM, promptly asleep. She endorsed one bathroom break at night. She will rises at 5 AM and relies on an alarm. She does not eat breakfast at home but may have a cup of coffee and usually watches the morning news on TV, at work she will have a pop tart or another quick breakfast. She does not take anymore  caffeine during the day. She has a rather sedentary job office-based. She does take several water cooler breaks, and there is a window not far from her cubicle. She only commutes 10 minutes to work.   Sleep medical history and family sleep history: Mother has Alzheimer's.   Social history: married, one daughter 66, works for [employer].   Review of Systems: Out of a complete 14 system review, the patient complains of only the following symptoms, and all other reviewed systems are negative.   Epworth score 14 , Fatigue severity score 32  , depression score 4/15    Social History   Social History  . Marital Status: Married    Spouse Name: N/A  . Number of Children: N/A  . Years of Education: N/A   Occupational History  . Advertising copywriter. Mgr    Social History Main Topics  . Smoking status: Never Smoker   . Smokeless tobacco: Not on file  . Alcohol Use: No  . Drug Use: No  . Sexual Activity: Not on file   Other Topics Concern  . Not on file   Social History Narrative    Family History  Problem Relation Age of Onset  . Hyperlipidemia Mother   . Hypertension Mother   . Heart disease      parent   . Hyperlipidemia Father   .  Hypertension Father   . Heart disease Father 22    Past Medical History  Diagnosis Date  . Hypertension   . Asthma     as a child  . Sleep apnea   . Seasonal allergies     Past Surgical History  Procedure Laterality Date  . Laparoscopic gastric banding  2009  . Cesarean section  1996  . Tonsillectomy and adenoidectomy    . Laparoscopic gastric banding  2009  . Breast surgery  1998    breast reduction    Current Outpatient Prescriptions  Medication Sig Dispense Refill  . loratadine (CLARITIN) 10 MG tablet Take 10 mg by mouth daily.    . valsartan-hydrochlorothiazide (DIOVAN-HCT) 80-12.5 MG tablet Take 1 tablet by mouth daily. 90 tablet 3   No current facility-administered medications for this visit.    Allergies as of  02/18/2016 - Review Complete 02/18/2016  Allergen Reaction Noted  . Codeine Nausea Only 11/12/2011    Vitals: BP 130/88 mmHg  Pulse 72  Resp 20  Ht 5\' 6"  (1.676 m)  Wt 235 lb (106.595 kg)  BMI 37.95 kg/m2 Last Weight:  Wt Readings from Last 1 Encounters:  02/18/16 235 lb (106.595 kg)   [id number] mass index is 37.95 kg/(m^2).     Last Height:   Ht Readings from Last 1 Encounters:  02/18/16 5\' 6"  (1.676 m)    Physical exam:  General: The patient is awake, alert and appears not in acute distress. The patient is well groomed. Head: Normocephalic, atraumatic. Neck is supple. Mallampati 4, status post tonsilectomy.  neck circumference:.17 Nasal airflow Patent. [name] is seen.  Cardiovascular:  Regular rate and rhythm , without  murmurs or carotid bruit, and without distended neck veins. Respiratory: Lungs are clear to auscultation. Skin:  Without evidence of edema, or rash Trunk: BMI is elevated  The patient's posture is erect.    Neurologic exam : The patient is awake and alert, oriented to place and time.   Memory subjective  described as intact. Attention span & concentration ability appears normal.  Speech is fluent,  without dysarthria, dysphonia or aphasia.  Mood and affect are appropriate.  Cranial nerves: Pupils are equal and briskly reactive to light. Funduscopic exam without  evidence of pallor or edema.  Extraocular movements  in vertical and horizontal planes intact and without nystagmus. Visual fields by finger perimetry are intact. Hearing to finger rub intact.  Facial sensation intact to fine touch. Facial motor strength is symmetric and tongue and uvula move midline. Shoulder shrug was symmetrical.   Motor exam:  Normal tone, muscle bulk and symmetric strength in all extremities.  Stance is stable and normal.  Deep tendon reflexes: in the  upper and lower extremities are symmetric and intact. Babinski maneuver response is downgoing.  The patient was  advised of the nature of the diagnosed sleep disorder , the treatment options and risks for general a health and wellness arising from not treating the condition.  I spent more than 30 minutes of face to face time with the patient. Greater than 50% of time was spent in counseling and coordination of care. We have discussed the diagnosis and differential and I answered the patient's questions.     Assessment:  After physical and neurologic examination, review of laboratory studies,  Personal review of imaging studies, reports of other /same  Imaging studies ,  Results of polysomnography/ neurophysiology testing and pre-existing records as far as provided in visit., my assessment is  1) snoring, excessive daytime sleepiness and a history of diagnosed obstructive sleep apnea, formerly treated with CPAP also reports the assumption that OSA is still present. The patient has not gained significant weight since her last sleep evaluation. But she is under more stress as a caretaker. She feels fatigued exhausted. She allocated 7 hours nightly to sleep.   Plan:  Treatment plan and additional workup :   HST to re-evaluate for OSA, and treatment accordingly.  Obesity: patient has been discussion weight loss strategies with PCP.     Asencion Partridge Tristyn Demarest MD  02/18/2016   CC: Lucretia Kern, Do [address] [place], [place] [address]

## 2016-02-22 ENCOUNTER — Encounter (INDEPENDENT_AMBULATORY_CARE_PROVIDER_SITE_OTHER): Payer: Commercial Managed Care - HMO | Admitting: Neurology

## 2016-02-22 DIAGNOSIS — G4733 Obstructive sleep apnea (adult) (pediatric): Secondary | ICD-10-CM | POA: Diagnosis not present

## 2016-02-22 DIAGNOSIS — G4730 Sleep apnea, unspecified: Secondary | ICD-10-CM | POA: Diagnosis not present

## 2016-02-22 DIAGNOSIS — G4710 Hypersomnia, unspecified: Secondary | ICD-10-CM | POA: Diagnosis not present

## 2016-02-22 DIAGNOSIS — R0683 Snoring: Secondary | ICD-10-CM | POA: Diagnosis not present

## 2016-02-27 ENCOUNTER — Other Ambulatory Visit: Payer: Self-pay | Admitting: Obstetrics and Gynecology

## 2016-02-27 DIAGNOSIS — Z1231 Encounter for screening mammogram for malignant neoplasm of breast: Secondary | ICD-10-CM

## 2016-02-28 ENCOUNTER — Telehealth: Payer: Self-pay

## 2016-02-28 DIAGNOSIS — G4733 Obstructive sleep apnea (adult) (pediatric): Secondary | ICD-10-CM

## 2016-02-28 NOTE — Telephone Encounter (Signed)
I spoke to pt. I advised her that her study reveals osa and hypoxemia and treatment is advised. PAP therapy is the preferred treatment. Alternative therapies such as oral appliance or ENT evaluation will address apnea and snoring but don't influence the hypoxemia component. Pt is agreeable to returning for a cpap titration study. I advised pt to consider weight loss by diet and exercise if not contraindicated by her other physicians, and to try and sleep on her side. I advised pt to avoid sedative-hypnotics which may worsen sleep apnea, alcohol and tobacco. Pt was advised to not drive or operate hazardous machinery when sleepy. Pt knows that our sleep lab will call her to schedule her sleep study. Pt verbalized understanding of results. Pt had no questions at this time but was encouraged to call back if questions arise.

## 2016-03-18 ENCOUNTER — Ambulatory Visit
Admission: RE | Admit: 2016-03-18 | Discharge: 2016-03-18 | Disposition: A | Discharge status: Home / Self Care | Payer: Commercial Managed Care - HMO | Source: Ambulatory Visit | Attending: Obstetrics and Gynecology | Admitting: Obstetrics and Gynecology

## 2016-03-18 DIAGNOSIS — Z1231 Encounter for screening mammogram for malignant neoplasm of breast: Secondary | ICD-10-CM

## 2016-04-04 ENCOUNTER — Ambulatory Visit (INDEPENDENT_AMBULATORY_CARE_PROVIDER_SITE_OTHER): Payer: Commercial Managed Care - HMO | Admitting: Neurology

## 2016-04-04 DIAGNOSIS — G4733 Obstructive sleep apnea (adult) (pediatric): Secondary | ICD-10-CM

## 2016-04-18 ENCOUNTER — Institutional Professional Consult (permissible substitution): Payer: Commercial Managed Care - HMO | Admitting: Pulmonary Disease

## 2016-04-22 ENCOUNTER — Telehealth: Payer: Self-pay

## 2016-04-22 DIAGNOSIS — G4733 Obstructive sleep apnea (adult) (pediatric): Secondary | ICD-10-CM

## 2016-04-22 NOTE — Telephone Encounter (Signed)
I spoke to pt. I advised her that her cpap titration showed improvement in respiratory events and Dr. Brett Fairy recommends starting a cpap. Pt is agreeable to this. I advised her that I would send the order to [facility]. Pt verbalized understanding of results. Pt had no questions at this time but was encouraged to call back if questions arise. A follow up was made with pt for 06/18/16 at 10:00. Pt verbalized understanding.

## 2016-06-14 ENCOUNTER — Encounter: Payer: Self-pay | Admitting: Neurology

## 2016-06-16 ENCOUNTER — Encounter: Payer: Self-pay | Admitting: Neurology

## 2016-06-16 ENCOUNTER — Ambulatory Visit (INDEPENDENT_AMBULATORY_CARE_PROVIDER_SITE_OTHER): Payer: Commercial Managed Care - HMO | Admitting: Neurology

## 2016-06-16 VITALS — BP 120/86 | HR 76 | Resp 20 | Ht 66.0 in | Wt 231.0 lb

## 2016-06-16 DIAGNOSIS — G4733 Obstructive sleep apnea (adult) (pediatric): Secondary | ICD-10-CM | POA: Insufficient documentation

## 2016-06-16 DIAGNOSIS — Z9989 Dependence on other enabling machines and devices: Secondary | ICD-10-CM

## 2016-06-16 NOTE — Progress Notes (Signed)
SLEEP MEDICINE CLINIC   Provider:  Larey Seat, M D  Referring Provider: Lucretia Kern, DO Primary Care Physician:  Lucretia Kern., DO  Chief Complaint  Patient presents with  . Follow-up    cpap going well    HPI:  Gabrielle Moore is a 60 y.o. female , seen here as a referral from Dr. Maudie Mercury for a sleep medicine consultation,   Gabrielle Moore had undergone a sleep study in 2008 or 2009, and was placed on CPAP. In a move 2014 she lost the CPAP and needs a replacement.  Her family has reported that she snores very loudly, her husband even found no relief in using earplugs. 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 She has not woken herself choking for air or gasping, but she does have nocturia. She feels tired and ready to go to bed after work. And she does fall asleep rather promptly and endorsed the daytime Epworth sleepiness score at 14 points. Recently she drove from [facility] to [place] but had to take a stop and the 50 minute power nap before she completed the journey.  Chief complaint according to patient : Recently when the patient had to overcome an upper airway respiratory infection she was forced to mouth breathe and was snoring even louder than usual, this caused the referral for Evaluation.  Sleep habits are as follows: She reports that after work she may visit her mother, and will stay at her home for about 5 hours. She feels exhausted as a Land. She will return to her own home by about 9 PM and is in bed by 10 PM, promptly asleep. She endorsed one bathroom break at night. She will rises at 5 AM and relies on an alarm. She does not eat breakfast at home but may have a cup of coffee and usually watches the morning news on TV, at work she will have a pop tart or another quick breakfast. She does not take anymore caffeine during the day. She has a rather sedentary job office-based. She does take several  water cooler breaks, and there is a window not far from her cubicle. She only commutes 10 minutes to work.  Sleep medical history and family sleep history: Mother has Alzheimer's.  Social history: married, one daughter 31, works for [employer].   06-16-2016, Gabrielle Moore underwent a home sleep test on 02/23/2016 which diagnosed her with mild to moderate apnea at an AHI of 14.4 but associated with hypoxemia for 64.6 minutes of desaturation time. The nadir of oxygen saturation was 77%. For this reason she was invited for CPAP titration, as a dental device would not address the hypoxemia component of her apnea. She was titrated on 04/04/2016 to 11 cm water. But did best at 9 cm water setting. She experienced rebounding REM sleep, and a very normal sleep architecture  Gabrielle Moore has brought her CPAP today and he were able by by 5 to investigate her compliance she has only used the machine over 4 hours nightly for 15 days of the last 30 days, on those nights she used to machine and her average user time is 6 hours and 9 minutes. She averaged all over only 3 hours and 41 minutes. There has been some social environmental changes from Gabrielle Moore that explain her fragmented sleep. She has a new puppy in the house. The residual AHI was only 1.8, there is an excellent resolution  of apnea there is a mild to moderate air leak at 9 cm water pressure with 3 cm EPR. I like for Gabrielle Moore to continue using CPAP at the settings. She also seems to be no longer fatigued or sleepy her Epworth sleepiness score today was endorsed at 8 points   Review of Systems: Out of a complete 14 system review, the patient complains of only the following symptoms, and all other reviewed systems are negative.  Epworth score  8 from 14 , Fatigue severity score n/a  , depression score n/a    Social History   Social History  . Marital status: Married    Spouse name: N/A  . Number of children: N/A  . Years  of education: N/A   Occupational History  . 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 Smoking status: Never Smoker  . Smokeless tobacco: Not on file  . Alcohol use No  . Drug use: No  . Sexual activity: Not on file   Other Topics Concern  . Not on file   Social History Narrative  . No narrative on file    Family History  Problem Relation Age of Onset  . Hyperlipidemia Mother   . Hypertension Mother   . Heart disease      parent   . Hyperlipidemia Father   . Hypertension Father   . Heart disease Father 33    Past Medical History:  Diagnosis Date  . Asthma    as a child  . Hypertension   . Seasonal allergies   . Sleep apnea     Past Surgical History:  Procedure Laterality Date  . BREAST SURGERY  1998   breast reduction  . CESAREAN SECTION  1996  . LAPAROSCOPIC GASTRIC BANDING  2009  . LAPAROSCOPIC GASTRIC BANDING  2009  . TONSILLECTOMY AND ADENOIDECTOMY      Current Outpatient Prescriptions  Medication Sig Dispense Refill  . loratadine (CLARITIN) 10 MG tablet Take 10 mg by mouth daily.    . valsartan-hydrochlorothiazide (DIOVAN-HCT) 80-12.5 MG tablet Take 1 tablet by mouth daily. 90 tablet 3   No current facility-administered medications for this visit.     Allergies as of 06/16/2016 - Review Complete 06/16/2016  Allergen Reaction Noted  . Codeine Nausea Only 11/12/2011    Vitals: BP 120/86   Pulse 76   Resp 20   Ht 5\' 6"  (1.676 m)   Wt 231 lb (104.8 kg)   BMI 37.28 kg/m  Last Weight:  Wt Readings from Last 1 Encounters:  06/16/16 231 lb (104.8 kg)   [id number] mass index is 37.28 kg/m.     Last Height:   Ht Readings from Last 1 Encounters:  06/16/16 5\' 6"  (1.676 m)    Physical exam:  General: The patient is awake, alert and appears not in acute distress. The patient is well groomed. Head: Normocephalic, atraumatic. Neck is supple. Mallampati 4, status post tonsilectomy.  neck circumference:.17 Nasal airflow  Patent. [name] is seen.  Cardiovascular:  Regular rate and rhythm , without  murmurs or carotid bruit, and without distended neck veins. Respiratory: Lungs are clear to auscultation. Skin:  Without evidence of edema, or rash Trunk: BMI is elevated  The patient's posture is erect.    Neurologic exam : The patient is awake and alert, oriented to place and time.   Memory subjective  described as intact. Attention span & concentration ability appears normal.  Speech is fluent,  without dysarthria, dysphonia or aphasia.  Mood and affect are appropriate.  Cranial nerves: Pupils are equal and briskly reactive to light. Funduscopic exam without  evidence of pallor or edema.Extraocular movements  in vertical and horizontal planes intact and without nystagmus. Visual fields by finger perimetry are intact.Hearing to finger rub intact.  Facial sensation intact to fine touch. Facial motor strength is symmetric and tongue and uvula move midline. Shoulder shrug was symmetrical.   Motor exam:  Normal tone, muscle bulk and symmetric strength in all extremities.  Stance is stable and normal.  Deep tendon reflexes: in the  upper and lower extremities are symmetric and intact. Babinski maneuver response is downgoing.  The patient was advised of the nature of the diagnosed sleep disorder , the treatment options and risks for general a health and wellness arising from not treating the condition.  I spent more than 25 minutes of face to face time with the patient. Greater than 50% of time was spent in counseling and coordination of care. We have discussed the compliance issue and the  patient's questions.     Assessment:  After physical and neurologic examination, review of laboratory studies,  Personal review of imaging studies, reports of other /same  Imaging studies ,  Results of polysomnography/ neurophysiology testing and pre-existing records as far as provided in visit., my assessment is    Plan:   Treatment plan and additional workup : RV in 12 month with new compliance data.  Establish an exercise routine.         Asencion Partridge Shonn Farruggia MD  06/16/2016   CC: Lucretia Kern, Do [address] [place], [place] [address]

## 2016-06-16 NOTE — Patient Instructions (Signed)
Please continue using your CPAP regularly. While your insurance requires that you use CPAP at least 4 hours each night on 70% of the nights, I recommend, that you not skip any nights and use it throughout the night if you can. Getting used to CPAP and staying with the treatment long term does take time and patience and discipline. Untreated obstructive sleep apnea when it is moderate to severe can have an adverse impact on cardiovascular health and raise her risk for heart disease, arrhythmias, hypertension, congestive heart failure, stroke and diabetes. Untreated obstructive sleep apnea causes sleep disruption, nonrestorative sleep, and sleep deprivation. This can have an impact on your day to day functioning and cause daytime sleepiness and impairment of cognitive function, memory loss, mood disturbance, and problems focussing. Using CPAP regularly can improve these symptoms.  Gabrielle Partridge Khristina Janota, MD     CPAP and BIPAP Information CPAP and BIPAP are methods of helping you breathe with the use of air pressure. CPAP stands for "continuous positive airway pressure." BIPAP stands for "bi-level positive airway pressure." In both methods, air is blown into your air passages to help keep you breathing well. With CPAP, the amount of pressure stays the same while you breathe in and out. CPAP is most commonly used for obstructive sleep apnea. For obstructive sleep apnea, CPAP works by holding your airways open so that they do not collapse when your muscles relax during sleep. BIPAP is similar to CPAP except the amount of pressure is increased when you inhale. This helps you take larger breaths. Your health care provider will recommend whether CPAP or BIPAP would be more helpful for you.  WHY ARE CPAP AND BIPAP TREATMENTS USED? CPAP or BIPAP can be helpful if you have:   Sleep apnea.   Chronic obstructive pulmonary disease (COPD).   Diseases that weaken the muscles of the chest, including muscular dystrophy or  neurological diseases such as amyotrophic lateral sclerosis (ALS).  Other problems that cause breathing to be weak, abnormal, or difficult.  HOW IS CPAP OR BIPAP ADMINISTERED? Both CPAP and BIPAP are provided by a small machine with a flexible plastic tube that attaches to a plastic mask. The mask fits on your face, and air is blown into your air passages through your nose or mouth. The amount of pressure that is used to blow the air into your air passages can be set on the machine. Your health care provider will determine the pressure setting that should be used based on your individual needs. WHEN SHOULD CPAP OR BIPAP BE USED? In most cases, the mask is worn only when sleeping. Generally, you will need to wear the mask throughout the night and during the daytime if you take a nap. In a few cases involving certain medical conditions, people also need to wear the mask at other times when they are awake. Follow your health care provider's instructions for when to use the machine.  USING THE MASK  Because the mask needs to be snug, some people feel a trapped or closed-in feeling (claustrophobic) when first using the mask. You may need to get used to the mask gradually. To do this, you can first hold the mask loosely over your nose or mouth. Gradually apply the mask more snugly. You can also gradually increase the amount of time that you use the mask.  Masks are available in various types and sizes. Some fit over your mouth and nose, and some fit over just your nose. If your mask does not fit  well, talk to your health care provider about getting a different one.  If you are using a nasal mask and you tend to breathe through your mouth, a chin strap may be applied to help keep your mouth closed.   The CPAP and BIPAP machines have alarms that may sound if the mask comes off or develops a leak.  If you have trouble with the mask, it is very important that you talk to your health care provider about  finding a way to make the mask easier to tolerate. Do not stop using the mask. This could have a negative impact on your health. TIPS FOR USING THE MACHINE  Place your CPAP or BIPAP machine on a secure table or stand near an electrical outlet.   Know where the on-off switch is located on the machine.  Follow your health care provider's instructions for how to set the pressure on your machine and when you should use it.  Do not eat or drink while the CPAP or BIPAP machine is on. Food or fluids could get pushed into your lungs by the pressure of the CPAP or BIPAP.  Do not smoke. Tobacco smoke residue can damage the machine.   For home use, CPAP and BIPAP machines can be rented or purchased through home health care companies. Many different brands of machines are available. Renting a machine before purchasing may help you find out which particular machine works well for you. SEEK IMMEDIATE MEDICAL CARE IF:  You have redness or open areas around your nose or mouth where the mask fits.   You have trouble operating the CPAP or BIPAP machine.   You cannot tolerate wearing the CPAP or BIPAP mask.    This information is not intended to replace advice given to you by your health care provider. Make sure you discuss any questions you have with your health care provider.   Document Released: 05/23/2004 Document Revised: 09/15/2014 Document Reviewed: 03/24/2013 Elsevier Interactive Patient Education [employer].

## 2016-06-18 ENCOUNTER — Ambulatory Visit: Payer: Self-pay | Admitting: Neurology

## 2016-06-25 ENCOUNTER — Ambulatory Visit (INDEPENDENT_AMBULATORY_CARE_PROVIDER_SITE_OTHER): Payer: Commercial Managed Care - HMO

## 2016-06-25 DIAGNOSIS — Z23 Encounter for immunization: Secondary | ICD-10-CM

## 2016-07-15 ENCOUNTER — Other Ambulatory Visit: Payer: Self-pay | Admitting: Obstetrics and Gynecology

## 2016-07-15 DIAGNOSIS — Z1231 Encounter for screening mammogram for malignant neoplasm of breast: Secondary | ICD-10-CM

## 2016-07-18 ENCOUNTER — Encounter (HOSPITAL_COMMUNITY): Payer: Self-pay

## 2016-07-21 ENCOUNTER — Other Ambulatory Visit: Payer: Self-pay | Admitting: Obstetrics and Gynecology

## 2016-07-21 DIAGNOSIS — N63 Unspecified lump in unspecified breast: Secondary | ICD-10-CM

## 2016-07-24 ENCOUNTER — Ambulatory Visit
Admission: RE | Admit: 2016-07-24 | Discharge: 2016-07-24 | Disposition: A | Discharge status: Home / Self Care | Payer: Commercial Managed Care - HMO | Source: Ambulatory Visit | Attending: Obstetrics and Gynecology | Admitting: Obstetrics and Gynecology

## 2016-07-24 DIAGNOSIS — N63 Unspecified lump in unspecified breast: Secondary | ICD-10-CM

## 2016-08-23 ENCOUNTER — Other Ambulatory Visit: Payer: Self-pay | Admitting: Family Medicine

## 2016-08-28 NOTE — Progress Notes (Signed)
HPI:  Here for CPE:  -Concerns and/or follow up today: PMH significant for HTN, Obesity, OSA on CPAP (sees Dr. Brett Fairy) Due for labs Had pap with gyn  -Diet:  Not great  -Exercise: no regular exercise  -Taking folic acid, vitamin D or calcium: no  -Diabetes and Dyslipidemia Screening: FASTING for labs  -Hx of HTN: no  -Vaccines: UTD  -pap history: sees gyn (Dr. Marvel Plan)  -sexual activity: yes, female partner, no new partners  -wants STI testing (Hep C if born [birth year]): agreed to hep c screening  -FH breast, colon or ovarian ca: see FH Last mammogram: sees gyn, 07/2016 Last colon cancer screening: refused colonoscopy - was doing stool cards - last done in 2015 per our chart, but she reports did with Dr. Marvel Plan in 2016, agreeable to cologuard after discussion options  -Alcohol, Tobacco, drug use: see social history  Review of Systems - no fevers, unintentional weight loss, vision loss, hearing loss, chest pain, sob, hemoptysis, melena, hematochezia, hematuria, genital discharge, changing or concerning skin lesions, bleeding, bruising, loc, thoughts of self harm or SI  Past Medical History:  Diagnosis Date  . Asthma    as a child  . Hypertension   . Seasonal allergies   . Sleep apnea     Past Surgical History:  Procedure Laterality Date  . BREAST SURGERY  1998   breast reduction  . CESAREAN SECTION  1996  . LAPAROSCOPIC GASTRIC BANDING  2009  . LAPAROSCOPIC GASTRIC BANDING  2009  . TONSILLECTOMY AND ADENOIDECTOMY      Family History  Problem Relation Age of Onset  . Hyperlipidemia Mother   . Hypertension Mother   . Heart disease      parent   . Hyperlipidemia Father   . Hypertension Father   . Heart disease Father 84    Social History   Social History  . Marital status: Married    Spouse name: N/A  . Number of children: N/A  . Years of education: N/A   Occupational History  . 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 Smoking status: Never Smoker  . Smokeless tobacco: None  . Alcohol use No  . Drug use: No  . Sexual activity: Not Asked   Other Topics Concern  . None   Social History Narrative  . None     Current Outpatient Prescriptions:  .  loratadine (CLARITIN) 10 MG tablet, Take 10 mg by mouth daily., Disp: , Rfl:  .  valsartan-hydrochlorothiazide (DIOVAN-HCT) 80-12.5 MG tablet, Take 1 tablet by mouth daily., Disp: 90 tablet, Rfl: 3  EXAM:  Vitals:   08/29/16 0828  BP: 112/80  Pulse: 80  Temp: 98 F (36.7 C)   Body mass index is 38.58 kg/m.  GENERAL: vitals reviewed and listed below, alert, oriented, appears well hydrated and in no acute distress  HEENT: head atraumatic, PERRLA, normal appearance of eyes, ears, nose and mouth. moist mucus membranes.  NECK: supple, no masses or lymphadenopathy  LUNGS: clear to auscultation bilaterally, no rales, rhonchi or wheeze  CV: HRRR, no peripheral edema or cyanosis, normal pedal pulses  ABDOMEN: bowel sounds normal, soft, non tender to palpation, no masses, no rebound or guarding  SKIN: no rash or abnormal lesions; refused full skin check - reports does with gyn  GU/BREAST: declined, does with gyn  MS: normal gait, moves all extremities normally  NEURO: normal gait, speech and thought processing grossly intact, muscle tone grossly intact  throughout  [facility]: normal affect, pleasant and cooperative  ASSESSMENT AND PLAN:  Discussed the following assessment and plan:  Encounter for preventive health examination - Plan: Lipid panel, Hemoglobin A1c, Hepatitis C antibody  Essential hypertension - Plan: CBC, Basic metabolic panel  BMI 35.4-56.2,BWLSL  -Discussed and advised all [place] preventive services health task force level A and B recommendations for age, sex and risks.  -Advised at least 150 minutes of exercise per week and a healthy diet with avoidance of (less then 1 serving per week) processed foods, white starches,  red meat, fast foods and sweets and consisting of: * 5-9 servings of fresh fruits and vegetables (not corn or potatoes) *nuts and seeds, beans *olives and olive oil *lean meats such as fish and white chicken  *whole grains  -labs, studies and vaccines per orders this encounter  Orders Placed This Encounter  Procedures  . Lipid panel  . Hemoglobin A1c  . CBC  . Basic metabolic panel  . Hepatitis C antibody    Patient advised to return to clinic immediately if symptoms worsen or persist or new concerns.  Patient Instructions  BEFORE YOU LEAVE: -follow up: 6 months -labs -order cologuard  Vit D3 [phone] IU daily.  We ordered the Cologuard test for colon cancer screening. Please complete this test promptly once the kit arrives. Please contact us if you have not received your kit in the next few weeks.  We have ordered labs or studies at this visit. It can take up to 1-2 weeks for results and processing. IF results require follow up or explanation, we will call you with instructions. Clinically stable results will be released to your [facility]. If you have not heard from [place] or cannot find your results in [facility] in 2 weeks please contact our office at [phone].  If you are not yet signed up for [facility], please consider signing up.   We recommend the following healthy lifestyle for LIFE: 1) Small portions.   Tip: eat off of a salad plate instead of a dinner plate.  Tip: It is ok to feel hungry after a meal of proper portion sizes  Tip: if you need more or a snack choose fruits, veggies and/or a handful of nuts or seeds.  2) Eat a healthy clean diet.  * Tip: Avoid (less then 1 serving per week): processed foods, sweets, sweetened drinks, white starches (rice, flour, bread, potatoes, pasta, etc), red meat, fast foods, butter  *Tip: CHOOSE instead   * 5-9 servings per day of fresh or frozen fruits and vegetables (but not corn, potatoes, bananas, canned or dried fruit)   *nuts  and seeds, beans   *olives and olive oil   *small portions of lean meats such as fish and white chicken    *small portions of whole grains  3)Get at least 150 minutes of sweaty aerobic exercise per week.  4)Reduce stress - consider counseling, meditation and relaxation to balance other aspects of your life.         No Follow-up on file.  Colin Benton R., DO

## 2016-08-29 ENCOUNTER — Ambulatory Visit (INDEPENDENT_AMBULATORY_CARE_PROVIDER_SITE_OTHER): Payer: Commercial Managed Care - HMO | Admitting: Family Medicine

## 2016-08-29 ENCOUNTER — Encounter: Payer: Self-pay | Admitting: Family Medicine

## 2016-08-29 VITALS — BP 112/80 | HR 80 | Temp 98.0°F | Ht 66.0 in | Wt 239.0 lb

## 2016-08-29 DIAGNOSIS — Z6838 Body mass index (BMI) 38.0-38.9, adult: Secondary | ICD-10-CM

## 2016-08-29 DIAGNOSIS — Z0000 Encounter for general adult medical examination without abnormal findings: Secondary | ICD-10-CM | POA: Diagnosis not present

## 2016-08-29 DIAGNOSIS — I10 Essential (primary) hypertension: Secondary | ICD-10-CM | POA: Diagnosis not present

## 2016-08-29 LAB — LIPID PANEL
Cholesterol: 180 mg/dL (ref 0–200)
HDL: 53.5 mg/dL (ref >39.00)
LDL Cholesterol: 106 mg/dL — ABNORMAL HIGH (ref 0–99)
NonHDL: 126.32
TRIGLYCERIDES: 101 mg/dL (ref 0.0–149.0)
Total CHOL/HDL Ratio: 3
VLDL: 20.2 mg/dL (ref 0.0–40.0)

## 2016-08-29 LAB — CBC
HEMATOCRIT: 45.4 % (ref 36.0–46.0)
Hemoglobin: 15.6 g/dL — ABNORMAL HIGH (ref 12.0–15.0)
MCHC: 34.3 g/dL (ref 30.0–36.0)
MCV: 88.2 fl (ref 78.0–100.0)
PLATELETS: 259 10*3/uL (ref 150.0–400.0)
RBC: 5.15 Mil/uL — AB (ref 3.87–5.11)
RDW: 12.9 % (ref 11.5–15.5)
WBC: 6.1 10*3/uL (ref 4.0–10.5)

## 2016-08-29 LAB — BASIC METABOLIC PANEL
BUN: 14 mg/dL (ref 6–23)
CALCIUM: 9.5 mg/dL (ref 8.4–10.5)
CO2: 32 mEq/L (ref 19–32)
CREATININE: 0.72 mg/dL (ref 0.40–1.20)
Chloride: 103 mEq/L (ref 96–112)
GFR: 88.06 mL/min (ref >60.00)
GLUCOSE: 107 mg/dL — AB (ref 70–99)
Potassium: 4.2 mEq/L (ref 3.5–5.1)
Sodium: 143 mEq/L (ref 135–145)

## 2016-08-29 LAB — HEMOGLOBIN A1C: Hgb A1c MFr Bld: 5.8 % (ref 4.6–6.5)

## 2016-08-29 MED ORDER — VALSARTAN-HYDROCHLOROTHIAZIDE 80-12.5 MG PO TABS: 1.0000 | ORAL_TABLET | Freq: Every day | ORAL | 3 refills | Status: DC

## 2016-08-29 NOTE — Progress Notes (Signed)
Pre visit review using our clinic review tool, if applicable. No additional management support is needed unless otherwise documented below in the visit note. 

## 2016-08-29 NOTE — Patient Instructions (Signed)
BEFORE YOU LEAVE: -follow up: 6 months -labs -order cologuard  Vit D3 [phone] IU daily.  We ordered the Cologuard test for colon cancer screening. Please complete this test promptly once the kit arrives. Please contact us if you have not received your kit in the next few weeks.  We have ordered labs or studies at this visit. It can take up to 1-2 weeks for results and processing. IF results require follow up or explanation, we will call you with instructions. Clinically stable results will be released to your [facility]. If you have not heard from [place] or cannot find your results in [facility] in 2 weeks please contact our office at [phone].  If you are not yet signed up for [facility], please consider signing up.   We recommend the following healthy lifestyle for LIFE: 1) Small portions.   Tip: eat off of a salad plate instead of a dinner plate.  Tip: It is ok to feel hungry after a meal of proper portion sizes  Tip: if you need more or a snack choose fruits, veggies and/or a handful of nuts or seeds.  2) Eat a healthy clean diet.  * Tip: Avoid (less then 1 serving per week): processed foods, sweets, sweetened drinks, white starches (rice, flour, bread, potatoes, pasta, etc), red meat, fast foods, butter  *Tip: CHOOSE instead   * 5-9 servings per day of fresh or frozen fruits and vegetables (but not corn, potatoes, bananas, canned or dried fruit)   *nuts and seeds, beans   *olives and olive oil   *small portions of lean meats such as fish and white chicken    *small portions of whole grains  3)Get at least 150 minutes of sweaty aerobic exercise per week.  4)Reduce stress - consider counseling, meditation and relaxation to balance other aspects of your life.

## 2016-08-30 LAB — HEPATITIS C ANTIBODY: HCV AB: NEGATIVE

## 2016-09-09 ENCOUNTER — Encounter: Payer: Self-pay | Admitting: Family Medicine

## 2016-09-11 ENCOUNTER — Other Ambulatory Visit: Payer: Self-pay | Admitting: *Deleted

## 2016-09-11 NOTE — Telephone Encounter (Signed)
See My chart message

## 2016-09-13 DIAGNOSIS — G4733 Obstructive sleep apnea (adult) (pediatric): Secondary | ICD-10-CM | POA: Diagnosis not present

## 2016-09-14 DIAGNOSIS — Z1212 Encounter for screening for malignant neoplasm of rectum: Secondary | ICD-10-CM | POA: Diagnosis not present

## 2016-09-14 DIAGNOSIS — Z1211 Encounter for screening for malignant neoplasm of colon: Secondary | ICD-10-CM | POA: Diagnosis not present

## 2016-09-15 LAB — COLOGUARD: COLOGUARD: NEGATIVE

## 2016-09-23 ENCOUNTER — Encounter: Payer: Self-pay | Admitting: Family Medicine

## 2016-09-24 DIAGNOSIS — S93501A Unspecified sprain of right great toe, initial encounter: Secondary | ICD-10-CM | POA: Diagnosis not present

## 2016-10-08 DIAGNOSIS — S93501D Unspecified sprain of right great toe, subsequent encounter: Secondary | ICD-10-CM | POA: Diagnosis not present

## 2016-10-14 DIAGNOSIS — G4733 Obstructive sleep apnea (adult) (pediatric): Secondary | ICD-10-CM | POA: Diagnosis not present

## 2016-11-10 DIAGNOSIS — S339XXA Sprain of unspecified parts of lumbar spine and pelvis, initial encounter: Secondary | ICD-10-CM | POA: Diagnosis not present

## 2016-11-11 DIAGNOSIS — S339XXA Sprain of unspecified parts of lumbar spine and pelvis, initial encounter: Secondary | ICD-10-CM | POA: Diagnosis not present

## 2016-11-11 DIAGNOSIS — G4733 Obstructive sleep apnea (adult) (pediatric): Secondary | ICD-10-CM | POA: Diagnosis not present

## 2016-11-13 DIAGNOSIS — S339XXA Sprain of unspecified parts of lumbar spine and pelvis, initial encounter: Secondary | ICD-10-CM | POA: Diagnosis not present

## 2016-12-01 DIAGNOSIS — M67912 Unspecified disorder of synovium and tendon, left shoulder: Secondary | ICD-10-CM | POA: Diagnosis not present

## 2016-12-12 DIAGNOSIS — G4733 Obstructive sleep apnea (adult) (pediatric): Secondary | ICD-10-CM | POA: Diagnosis not present

## 2016-12-22 DIAGNOSIS — M67912 Unspecified disorder of synovium and tendon, left shoulder: Secondary | ICD-10-CM | POA: Diagnosis not present

## 2017-01-07 ENCOUNTER — Encounter (HOSPITAL_COMMUNITY): Payer: Self-pay

## 2017-01-07 ENCOUNTER — Emergency Department (HOSPITAL_COMMUNITY): Payer: 59

## 2017-01-07 ENCOUNTER — Emergency Department (HOSPITAL_COMMUNITY)
Admission: EM | Admit: 2017-01-07 | Discharge: 2017-01-07 | Disposition: A | Discharge status: Home / Self Care | Payer: 59 | Attending: Emergency Medicine | Admitting: Emergency Medicine

## 2017-01-07 DIAGNOSIS — I10 Essential (primary) hypertension: Secondary | ICD-10-CM | POA: Insufficient documentation

## 2017-01-07 DIAGNOSIS — J45909 Unspecified asthma, uncomplicated: Secondary | ICD-10-CM | POA: Diagnosis not present

## 2017-01-07 DIAGNOSIS — R1013 Epigastric pain: Secondary | ICD-10-CM | POA: Diagnosis not present

## 2017-01-07 DIAGNOSIS — R112 Nausea with vomiting, unspecified: Secondary | ICD-10-CM | POA: Insufficient documentation

## 2017-01-07 NOTE — ED Triage Notes (Signed)
Pt states she had lap band surgery and today she was at lunch and took one bite of food and immediately started vomiting  Pt states since she has been unable to hold down water and she took some pepto and vomited that up as well  Pt states she has pressure in her epigastric area  Pt states in one of her visits she was told her lap band had slipped

## 2017-01-07 NOTE — ED Provider Notes (Signed)
Emergency Department Provider Note   I have reviewed the triage vital signs and the nursing notes.   HISTORY  Chief Complaint Emesis   HPI Gabrielle Moore is a 61 y.o. female with PMH of obesity s/p adjustable lap band surgery in 2012 pertinent for evaluation of acute onset nausea and vomiting. Patient states that she was eating some chicken salad around lunch time when she felt a heavy pressure in her chest as if something were stuck. She, shortly after had the urge to vomit. Since that time she has been unable to keep down any solids or liquids. She notes some "slippage" of the lap band noted during an office visit with Dr. Hassell Done last August but was told that the device cannot be removed because of this. She has been having some instances in the past where she'll swallow food and then feeling she has to straighten up her chest in order to allow the food to pass.   Past Medical History:  Diagnosis Date  . Asthma    as a child  . Hypertension   . Seasonal allergies   . Sleep apnea     Patient Active Problem List   Diagnosis Date Noted  . OSA on CPAP 06/16/2016  . Snoring 05/02/2013  . Obesity 07/19/2012  . Hypertension 07/19/2012  . History of laparoscopic adjustable gastric banding, APS, 01/25/2008. 11/14/2011    Past Surgical History:  Procedure Laterality Date  . BREAST SURGERY  1998   breast reduction  . CESAREAN SECTION  1996  . LAPAROSCOPIC GASTRIC BANDING  2009  . LAPAROSCOPIC GASTRIC BANDING  2009  . SHOULDER SURGERY    . TONSILLECTOMY AND ADENOIDECTOMY      Current Outpatient Rx  . Order #: [id number] Class: Historical Med  . Order #: [id number] Class: Normal    Allergies Codeine  Family History  Problem Relation Age of Onset  . Hyperlipidemia Mother   . Hypertension Mother   . Hyperlipidemia Father   . Hypertension Father   . Heart disease Father 27  . Heart attack Father   . Heart disease      parent     Social History Social History    Substance Use Topics  . Smoking status: Never Smoker  . Smokeless tobacco: Never Used  . Alcohol use No    Review of Systems  Constitutional: No fever/chills Eyes: No visual changes. ENT: No sore throat. Cardiovascular: Positive chest pain with swallowing.  Respiratory: Denies shortness of breath. Gastrointestinal: No abdominal pain. Positive nausea and vomiting.  No diarrhea.  No constipation. Genitourinary: Negative for dysuria. Musculoskeletal: Negative for back pain. Skin: Negative for rash. Neurological: Negative for headaches, focal weakness or numbness.  10-point ROS otherwise negative.  ____________________________________________   PHYSICAL EXAM:  VITAL SIGNS: ED Triage Vitals  Enc Vitals Group     BP 01/07/17 1906 (!) 158/84     Pulse Rate 01/07/17 1906 (!) 108     Resp 01/07/17 1906 18     Temp 01/07/17 1906 98.1 F (36.7 C)     Temp Source 01/07/17 1906 Oral     SpO2 01/07/17 1906 96 %     Pain Score 01/07/17 1912 6   Constitutional: Alert and oriented. Well appearing and in no acute distress. Eyes: Conjunctivae are normal.  Head: Atraumatic. Nose: No congestion/rhinnorhea. Mouth/Throat: Mucous membranes are moist.   Neck: No stridor.   Cardiovascular: Normal rate, regular rhythm. Good peripheral circulation. Grossly normal heart sounds.   Respiratory: Normal respiratory effort.  No retractions. Lungs CTAB. Gastrointestinal: Soft and nontender. No distention.  Musculoskeletal: No lower extremity tenderness nor edema. No gross deformities of extremities. Neurologic:  Normal speech and language. No gross focal neurologic deficits are appreciated.  Skin:  Skin is warm, dry and intact. No rash noted. Psychiatric: Mood and affect are normal. Speech and behavior are normal.  ____________________________________________  RADIOLOGY  Dg Abdomen Acute W/chest  Result Date: 01/07/2017 CLINICAL DATA:  Vomiting today epigastric pain. EXAM: DG ABDOMEN ACUTE W/  1V CHEST COMPARISON:  01/26/2008, 09/07/2015. FINDINGS: The abdominal gas pattern is negative for obstruction or perforation. Lap band reservoir and tubing appear grossly intact. No biliary or urinary calculi are evident. The upright view of the chest is notable only for mild unchanged linear scarring in the bases. IMPRESSION: Negative abdominal radiographs. Grossly intact appearances of the lap band. No acute cardiopulmonary disease. Electronically Signed   By: Andreas Newport M.D.   On: 01/07/2017 22:10    ____________________________________________   PROCEDURES  Procedure(s) performed:   Procedures  None ____________________________________________   INITIAL IMPRESSION / ASSESSMENT AND PLAN / ED COURSE  Pertinent labs & imaging results that were available during my care of the patient were reviewed by me and considered in my medical decision making (see chart for details).  Patient resents to the emergency department for evaluation of epigastric/chest discomfort with any PO intake in the setting of having an adjustable lap band device implanted in 2012. Plan for plain films and PO challenge. With symptoms only with swallowing do not feel that this represents an ACS or DVT/PE event.   10:34 PM Dr. Hassell Done saw the patient in the ED and withdrew 5 cc of fluid from the band. Patient drinking fluids and feeling much better.   11:00 PM Patient continues to feel much better after band adjustment. Plan for discharge with general surgery follow up.   At this time, I do not feel there is any life-threatening condition present. I have reviewed and discussed all results (EKG, imaging, lab, urine as appropriate), exam findings with patient. I have reviewed nursing notes and appropriate previous records.  I feel the patient is safe to be discharged home without further emergent workup. Discussed usual and customary return precautions. Patient and family (if present) verbalize understanding and  are comfortable with this plan.  Patient will follow-up with their primary care provider. If they do not have a primary care provider, information for follow-up has been provided to them. All questions have been answered.  ____________________________________________  FINAL CLINICAL IMPRESSION(S) / ED DIAGNOSES  Final diagnoses:  Non-intractable vomiting with nausea, unspecified vomiting type     MEDICATIONS GIVEN DURING THIS VISIT:  None  NEW OUTPATIENT MEDICATIONS STARTED DURING THIS VISIT:  None   Note:  This document was prepared using Dragon voice recognition software and may include unintentional dictation errors.  Nanda Quinton, MD Emergency Medicine   Margette Fast, MD 01/08/17 [phone]

## 2017-01-07 NOTE — Discharge Instructions (Signed)
You were seen in the ED today with vomiting. We deflated the lap band by 5 mL today. You will need to see the surgery team in the office to have it re-inflated to an acceptable.   Return to the ED with any new or worsening symptoms.

## 2017-01-11 DIAGNOSIS — G4733 Obstructive sleep apnea (adult) (pediatric): Secondary | ICD-10-CM | POA: Diagnosis not present

## 2017-02-03 ENCOUNTER — Other Ambulatory Visit (HOSPITAL_COMMUNITY): Payer: Self-pay | Admitting: Internal Medicine

## 2017-02-03 ENCOUNTER — Other Ambulatory Visit (HOSPITAL_COMMUNITY): Payer: Self-pay | Admitting: Surgery

## 2017-02-03 DIAGNOSIS — K9509 Other complications of gastric band procedure: Secondary | ICD-10-CM

## 2017-02-05 ENCOUNTER — Ambulatory Visit (HOSPITAL_COMMUNITY)
Admission: RE | Admit: 2017-02-05 | Discharge: 2017-02-05 | Disposition: A | Discharge status: Home / Self Care | Payer: 59 | Source: Ambulatory Visit | Attending: Surgery | Admitting: Surgery

## 2017-02-05 DIAGNOSIS — Z4651 Encounter for fitting and adjustment of gastric lap band: Secondary | ICD-10-CM | POA: Diagnosis not present

## 2017-02-05 DIAGNOSIS — K9509 Other complications of gastric band procedure: Secondary | ICD-10-CM | POA: Diagnosis not present

## 2017-02-11 DIAGNOSIS — G4733 Obstructive sleep apnea (adult) (pediatric): Secondary | ICD-10-CM | POA: Diagnosis not present

## 2017-03-13 DIAGNOSIS — G4733 Obstructive sleep apnea (adult) (pediatric): Secondary | ICD-10-CM | POA: Diagnosis not present

## 2017-05-22 DIAGNOSIS — J029 Acute pharyngitis, unspecified: Secondary | ICD-10-CM | POA: Diagnosis not present

## 2017-05-22 DIAGNOSIS — J0140 Acute pansinusitis, unspecified: Secondary | ICD-10-CM | POA: Diagnosis not present

## 2017-05-28 ENCOUNTER — Encounter: Payer: Self-pay | Admitting: Family Medicine

## 2017-06-02 DIAGNOSIS — G4733 Obstructive sleep apnea (adult) (pediatric): Secondary | ICD-10-CM | POA: Diagnosis not present

## 2017-06-15 ENCOUNTER — Encounter: Payer: Self-pay | Admitting: Neurology

## 2017-06-15 ENCOUNTER — Ambulatory Visit (INDEPENDENT_AMBULATORY_CARE_PROVIDER_SITE_OTHER): Payer: 59 | Admitting: Neurology

## 2017-06-15 ENCOUNTER — Ambulatory Visit (INDEPENDENT_AMBULATORY_CARE_PROVIDER_SITE_OTHER): Payer: 59 | Admitting: *Deleted

## 2017-06-15 VITALS — BP 121/76 | HR 75 | Ht 66.0 in | Wt 255.0 lb

## 2017-06-15 DIAGNOSIS — Z9989 Dependence on other enabling machines and devices: Secondary | ICD-10-CM | POA: Diagnosis not present

## 2017-06-15 DIAGNOSIS — Z23 Encounter for immunization: Secondary | ICD-10-CM

## 2017-06-15 DIAGNOSIS — G4733 Obstructive sleep apnea (adult) (pediatric): Secondary | ICD-10-CM | POA: Diagnosis not present

## 2017-06-15 NOTE — Progress Notes (Signed)
SLEEP MEDICINE CLINIC   Provider:  Larey Seat, M D  Referring Provider: Lucretia Kern, DO Primary Care Physician:  Lucretia Kern, DO  Chief Complaint  Patient presents with  . Follow-up    follow up, pt, alone, rm 10. pt states CPAP is working well and she sleeps about 7 hrs nightly    HPI: RV 06-15-2017 for CPAP compliance. Yearly RV. Mrs. Gabrielle Moore has used her machine 100% of the last 30 days was 93% compliance bedtime, average user time 5 hours and 49 minutes, CPAP is set at 9 cm water pressure with 3 cm EPR and her residual AHI of 1.5 per hour was achieved. No central apneas are emerging, the 95th percentile leak is 10.8. The patient feels comfortable with the use of her machine she is no longer daytime sleepy or fatigued, endorsed the Epworth score at 3 points and the fatigue severity at 17 points. No more power naps !     Gabrielle Moore is a 61 y.o. female , seen here as a referral from Dr. Maudie Mercury for a sleep medicine consultation,   Mrs. Gabrielle Moore had undergone a sleep study in 2008 or 2009, and was placed on CPAP. In a move 2014 she lost the CPAP and needs a replacement.  Her family has reported that she snores very loudly, her husband even found no relief in using earplugs. 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 She has not woken herself choking for air or gasping, but she does have nocturia. She feels tired and ready to go to bed after work. And she does fall asleep rather promptly and endorsed the daytime Epworth sleepiness score at 14 points. Recently she drove from [facility] to [place] but had to take a stop and the 50 minute power nap before she completed the journey.  Chief complaint according to patient : Recently when the patient had to overcome an upper airway respiratory infection she was forced to mouth breathe and was snoring even louder than usual, this caused the referral for  Evaluation.  Sleep habits are as follows: She reports that after work she may visit her mother, and will stay at her home for about 5 hours. She feels exhausted as a Land. She will return to her own home by about 9 PM and is in bed by 10 PM, promptly asleep. She endorsed one bathroom break at night. She will rises at 5 AM and relies on an alarm. She does not eat breakfast at home but may have a cup of coffee and usually watches the morning news on TV, at work she will have a pop tart or another quick breakfast. She does not take anymore caffeine during the day. She has a rather sedentary job office-based. She does take several water cooler breaks, and there is a window not far from her cubicle. She only commutes 10 minutes to work.  Sleep medical history and family sleep history: Mother has Alzheimer's.  Social history: married, one daughter 73, works for [employer].   06-16-2016, Mrs. Gabrielle Moore underwent a home sleep test on 02/23/2016 which diagnosed her with mild to moderate apnea at an AHI of 14.4 but associated with hypoxemia for 64.6 minutes of desaturation time. The nadir of oxygen saturation was 77%. For this reason she was invited for CPAP titration, as a dental device would not address the hypoxemia component of her apnea. She was titrated on 04/04/2016 to 11  cm water. But did best at 9 cm water setting. She experienced rebounding REM sleep, and a very normal sleep architecture  Mrs. Gabrielle Moore has brought her CPAP today and he were able by by 5 to investigate her compliance she has only used the machine over 4 hours nightly for 15 days of the last 30 days, on those nights she used to machine and her average user time is 6 hours and 9 minutes. She averaged all over only 3 hours and 41 minutes. There has been some social environmental changes from Mrs. Gabrielle Moore that explain her fragmented sleep. She has a new puppy in the house. The residual AHI was only 1.8, there is an  excellent resolution of apnea there is a mild to moderate air leak at 9 cm water pressure with 3 cm EPR. I like for Mrs. Gabrielle Moore to continue using CPAP at the settings. She also seems to be no longer fatigued or sleepy her Epworth sleepiness score today was endorsed at 8 points   Review of Systems: Out of a complete 14 system review, the patient complains of only the following symptoms, and all other reviewed systems are negative.  Epworth score  8 from 14 , Fatigue severity score n/a  , depression score n/a    Social History   Social History  . Marital status: Married    Spouse name: N/A  . Number of children: N/A  . Years of education: N/A   Occupational History  . 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 Smoking status: Never Smoker  . Smokeless tobacco: Never Used  . Alcohol use No  . Drug use: No  . Sexual activity: Not on file   Other Topics Concern  . Not on file   Social History Narrative  . No narrative on file    Family History  Problem Relation Age of Onset  . Hyperlipidemia Mother   . Hypertension Mother   . Hyperlipidemia Father   . Hypertension Father   . Heart disease Father 44  . Heart attack Father   . Heart disease Unknown        parent     Past Medical History:  Diagnosis Date  . Asthma    as a child  . Hypertension   . Seasonal allergies   . Sleep apnea     Past Surgical History:  Procedure Laterality Date  . BREAST SURGERY  1998   breast reduction  . CESAREAN SECTION  1996  . LAPAROSCOPIC GASTRIC BANDING  2009  . LAPAROSCOPIC GASTRIC BANDING  2009  . SHOULDER SURGERY    . TONSILLECTOMY AND ADENOIDECTOMY      Current Outpatient Prescriptions  Medication Sig Dispense Refill  . loratadine (CLARITIN) 10 MG tablet Take 10 mg by mouth daily.    . valsartan-hydrochlorothiazide (DIOVAN-HCT) 80-12.5 MG tablet Take 1 tablet by mouth daily. 90 tablet 3   No current facility-administered medications for  this visit.     Allergies as of 06/15/2017 - Review Complete 06/15/2017  Allergen Reaction Noted  . Codeine Nausea Only 11/12/2011    Vitals: BP 121/76   Pulse 75   Ht 5\' 6"  (1.676 m)   Wt 255 lb (115.7 kg)   BMI 41.16 kg/m  Last Weight:  Wt Readings from Last 1 Encounters:  06/15/17 255 lb (115.7 kg)   AVW:UJWJ mass index is 41.16 kg/m.     Last Height:   Ht Readings from Last 1 Encounters:  06/15/17 5\' 6"  (1.676 m)    Physical exam:  General: The patient is awake, alert and appears not in acute distress. The patient is well groomed. Head: Normocephalic, atraumatic. Neck is supple. Mallampati 4, status post tonsilectomy.  neck circumference:.17 Nasal airflow Patent. [name] is seen.  Cardiovascular:  Regular rate and rhythm , without  murmurs or carotid bruit, and without distended neck veins. Respiratory: Lungs are clear to auscultation. Skin:  Without evidence of edema, or rash Trunk: BMI is elevated  The patient's posture is erect.    Neurologic exam : The patient is awake and alert, oriented to place and time.   Memory subjective  described as intact. Attention span & concentration ability appears normal.  Speech is fluent,  without dysarthria, dysphonia or aphasia.  Mood and affect are appropriate.  Cranial nerves: Pupils are equal and briskly reactive to light.  Visual fields by finger perimetry are intact.Hearing to finger rub intact.  Facial sensation intact to fine touch. Facial motor strength is symmetric and tongue and uvula move midline. Shoulder shrug was symmetrical.  Motor exam:  Normal tone, muscle bulk and symmetric strength in all extremities.  Stance is stable and normal.  Deep tendon reflexes: in the  upper and lower extremities are symmetric and intact. She has significant knee arthritis- crepitation.   The patient was advised of the nature of the diagnosed sleep disorder , the treatment options and risks for general a health and wellness  arising from not treating the condition.  I spent more than 20 minutes of face to face time with the patient. Greater than 50% of time was spent in counseling and coordination of care. We have discussed the compliance issue and the  patient's questions.     Assessment:  After physical and neurologic examination, review of laboratory studies,  Personal review of imaging studies, reports of other /same  Imaging studies ,  Results of polysomnography/ neurophysiology testing and pre-existing records as far as provided in visit., my assessment is    Plan:  Treatment plan and additional workup : RV in 12 month with yearly compliance data.  Establish an exercise routine.  Low carb diet - patient is a lap- band patient , the lap band was deflated.        Asencion Partridge Bianey Tesoro MD  06/15/2017   CC: Lucretia Kern, Do [address] [place], [place] [address]

## 2017-06-18 DIAGNOSIS — M25562 Pain in left knee: Secondary | ICD-10-CM | POA: Diagnosis not present

## 2017-06-25 DIAGNOSIS — J0140 Acute pansinusitis, unspecified: Secondary | ICD-10-CM | POA: Diagnosis not present

## 2017-06-25 DIAGNOSIS — R05 Cough: Secondary | ICD-10-CM | POA: Diagnosis not present

## 2017-06-29 ENCOUNTER — Other Ambulatory Visit: Payer: Self-pay | Admitting: Family Medicine

## 2017-08-21 ENCOUNTER — Ambulatory Visit (INDEPENDENT_AMBULATORY_CARE_PROVIDER_SITE_OTHER): Payer: 59

## 2017-08-21 ENCOUNTER — Ambulatory Visit: Payer: 59 | Admitting: Podiatry

## 2017-08-21 ENCOUNTER — Encounter: Payer: Self-pay | Admitting: Podiatry

## 2017-08-21 VITALS — BP 127/75 | HR 76 | Resp 18

## 2017-08-21 DIAGNOSIS — M10071 Idiopathic gout, right ankle and foot: Secondary | ICD-10-CM | POA: Diagnosis not present

## 2017-08-21 DIAGNOSIS — M7751 Other enthesopathy of right foot: Secondary | ICD-10-CM

## 2017-08-21 DIAGNOSIS — M779 Enthesopathy, unspecified: Secondary | ICD-10-CM

## 2017-08-21 LAB — URIC ACID: URIC ACID, SERUM: 7.8 mg/dL — AB (ref 2.5–7.0)

## 2017-08-21 NOTE — Patient Instructions (Signed)

## 2017-08-24 MED ORDER — COLCHICINE 0.6 MG PO TABS: 0.6000 mg | ORAL_TABLET | Freq: Every day | ORAL | 0 refills | Status: DC

## 2017-08-24 NOTE — Progress Notes (Signed)
Called and spoke with the patient and stated that the blood work was elevated and that Dr Jacqualyn Posey would like her to take the RX that was sent to pharmacy and to call the office if any concerns or questions. Gabrielle Moore

## 2017-08-24 NOTE — Progress Notes (Signed)
Subjective:   Patient ID: Eduard Clos, female   DOB: 61 y.o.   MRN: [id number]   HPI Ms. Cammarano presents the office today for concerns of pain to the right foot along the big toe joint which started over the last couple of days.  She states that it came on suddenly and was very painful yesterday to the point where she had to take pain medicine that she had at home.  She states that the big toe joint hurts with pressure in shoes and she has noticed swelling and redness.  She denies any recent injury or trauma.  She said no recent treatment for this otherwise.  She has no other concerns.   Review of Systems  All other systems reviewed and are negative.  Past Medical History:  Diagnosis Date  . Asthma    as a child  . Hypertension   . Seasonal allergies   . Sleep apnea     Past Surgical History:  Procedure Laterality Date  . BREAST SURGERY  1998   breast reduction  . CESAREAN SECTION  1996  . LAPAROSCOPIC GASTRIC BANDING  2009  . LAPAROSCOPIC GASTRIC BANDING  2009  . SHOULDER SURGERY    . TONSILLECTOMY AND ADENOIDECTOMY       Current Outpatient Medications:  .  loratadine (CLARITIN) 10 MG tablet, Take 10 mg by mouth daily., Disp: , Rfl:  .  valsartan-hydrochlorothiazide (DIOVAN-HCT) 80-12.5 MG tablet, Take 1 tablet by mouth daily., Disp: 90 tablet, Rfl: 3 .  valsartan-hydrochlorothiazide (DIOVAN-HCT) 80-12.5 MG tablet, TAKE 1 TABLET BY MOUTH DAILY, Disp: 90 tablet, Rfl: 0  Allergies  Allergen Reactions  . Codeine Nausea Only    Social History   Socioeconomic History  . Marital status: Married    Spouse name: Not on file  . Number of children: Not on file  . Years of education: Not on file  . Highest education level: Not on file  Social Needs  . Financial resource strain: Not on file  . Food insecurity - worry: Not on file  . Food insecurity - inability: Not on file  . Transportation needs - medical: Not on file  . Transportation needs - non-medical: Not on  file  Occupational History  . Occupation: Advertising copywriter. Mgr    Employer: [employer]  Tobacco Use  . Smoking status: Never Smoker  . Smokeless tobacco: Never Used  Substance and Sexual Activity  . Alcohol use: No  . Drug use: No  . Sexual activity: Not on file  Other Topics Concern  . Not on file  Social History Narrative  . Not on file         Objective:  Physical Exam  General: AAO x3, NAD  Dermatological: Skin is warm, dry and supple bilateral. Nails x 10 are well manicured; remaining integument appears unremarkable at this time. There are no open sores, no preulcerative lesions, no rash or signs of infection present.  Vascular: Dorsalis Pedis artery and Posterior Tibial artery pedal pulses are 2/4 bilateral with immedate capillary fill time.  There is no pain with calf compression, swelling, warmth, erythema.   Neruologic: Grossly intact via light touch bilateral. Vibratory intact via tuning fork bilateral. Protective threshold with Semmes Wienstein monofilament intact to all pedal sites bilateral.   Musculoskeletal: There is tenderness palpation of the right first MTPJ and there is localized edema and erythema.  Mild discomfort MPJ range of motion most the tenderness is along the medial first metatarsal head on  the bunion site.  There is no other area of tenderness identified to bilateral lower extremities.  Muscular strength 5/5 in all groups tested bilateral.  Gait: Unassisted, Nonantalgic.       Assessment:   61 year old female with right foot capsulitis, gallop     Plan:  -Treatment options discussed including all alternatives, risks, and complications -Etiology of symptoms were discussed -X-rays were obtained and reviewed with the patient.  No evidence of acute fracture. -Discussed a steroid injection and she wished to proceed see procedure note below. -Ordered uric acid, ESR, CRP -Discussed diet modifications -Follow-up in 2 weeks if needed or  sooner if any issues are to arise.  Procedure: Injection, small joint Discussed alternatives, risks, complications and verbal consent was obtained.  Location: Right 1st MTPJ Skin Prep: Betadine. Injectate: 1 cc 0.5% marcaine plain, 1 cc kenalog 10. Disposition: Patient tolerated procedure well. Injection site dressed with a band-aid.  Post-injection care was discussed and return precautions discussed.   Trula Slade DPM

## 2017-08-25 ENCOUNTER — Telehealth: Payer: Self-pay | Admitting: *Deleted

## 2017-08-25 NOTE — Telephone Encounter (Signed)
Called and spoke with patient that her uric acid was elevated and that Dr Jacqualyn Posey sent colchicine to her pharmacy and to start it and if any concerns or questions to call the office. Lattie Haw

## 2017-09-03 ENCOUNTER — Encounter: Payer: Commercial Managed Care - HMO | Admitting: Family Medicine

## 2017-09-04 ENCOUNTER — Ambulatory Visit: Payer: 59 | Admitting: Podiatry

## 2017-09-05 NOTE — Progress Notes (Signed)
HPI:  Here for CPE:  -Concerns and/or follow up today:   PMH Obesity, HTN (meds valsartan-hctz), CPAP (sees specialist for management - on CPAP) here for her preventive care and follow up. She does not follow up on a regular basis as we advised. Prefers to follow her HTN at her physicals. New concern of gout.  She was diagnosed with gout by her foot specialist about 2 weeks ago when she had severe pain in the great toe.  Reports a lab test and x-ray were done and she had an elevated uric acid. she does not want to treat this with the medication.  She has instead decided to try to change her diet tart [place] the direction of her specialist.  Currently no regular exercise and diet is poor. Reports she continues to see her gynecologist yearly and does her mammogram there.  -Diet: variety of foods, balance and well rounded, larger portion sizes -Exercise: no regular exercise -Taking folic acid, vitamin D or calcium: no -Diabetes and Dyslipidemia Screening: Fasting for labs -Vaccines: see vaccine section EPIC -pap history: sees gyn, Dr. Marvel Plan -FDLMP: see nursing notes -sexual activity: yes, female partner, no new partners -wants STI testing (Hep C if born 59-65): no -FH breast, colon or ovarian ca: see FH Last mammogram: 07/2016 in chart, sees Dr. Marvel Plan for breast health Last colon cancer screening: cologuard was negative 09/2016 Breast Ca Risk Assessment: see family history and pt history DEXA (>/= 79): Not applicable  -Alcohol, Tobacco, drug use: see social history  Review of Systems - no fevers, unintentional weight loss, vision loss, hearing loss, chest pain, sob, hemoptysis, melena, hematochezia, hematuria, genital discharge, changing or concerning skin lesions, bleeding, bruising, loc, thoughts of self harm or SI  Past Medical History:  Diagnosis Date  . Asthma    as a child  . Hypertension   . Seasonal allergies   . Sleep apnea     Past Surgical History:  Procedure  Laterality Date  . BREAST SURGERY  1998   breast reduction  . CESAREAN SECTION  1996  . LAPAROSCOPIC GASTRIC BANDING  2009  . LAPAROSCOPIC GASTRIC BANDING  2009  . SHOULDER SURGERY    . TONSILLECTOMY AND ADENOIDECTOMY      Family History  Problem Relation Age of Onset  . Hyperlipidemia Mother   . Hypertension Mother   . Hyperlipidemia Father   . Hypertension Father   . Heart disease Father 53  . Heart attack Father   . Heart disease Unknown        parent     Social History   Socioeconomic History  . Marital status: Married    Spouse name: None  . Number of children: None  . Years of education: None  . Highest education level: None  Social Needs  . Financial resource strain: None  . Food insecurity - worry: None  . Food insecurity - inability: None  . Transportation needs - medical: None  . Transportation needs - non-medical: None  Occupational History  . Occupation: Advertising copywriter. Mgr    Employer: [place] [employer]  Tobacco Use  . Smoking status: Never Smoker  . Smokeless tobacco: Never Used  Substance and Sexual Activity  . Alcohol use: No  . Drug use: No  . Sexual activity: None  Other Topics Concern  . None  Social History Narrative  . None     Current Outpatient Medications:  .  colchicine 0.6 MG tablet, Take 1 tablet (0.6 mg total) by  mouth daily., Disp: 10 tablet, Rfl: 0 .  loratadine (CLARITIN) 10 MG tablet, Take 10 mg by mouth daily., Disp: , Rfl:  .  valsartan-hydrochlorothiazide (DIOVAN-HCT) 80-12.5 MG tablet, TAKE 1 TABLET BY MOUTH DAILY, Disp: 90 tablet, Rfl: 0  EXAM:  Vitals:   09/07/17 0752  BP: 118/70  Pulse: 88  Temp: 98.3 F (36.8 C)   Body mass index is 42.32 kg/m.  GENERAL: vitals reviewed and listed below, alert, oriented, appears well hydrated and in no acute distress  HEENT: head atraumatic, PERRLA, normal appearance of eyes, ears, nose and mouth. moist mucus membranes.  NECK: supple, no masses or  lymphadenopathy  LUNGS: clear to auscultation bilaterally, no rales, rhonchi or wheeze  CV: HRRR, no peripheral edema or cyanosis, normal pedal pulses  ABDOMEN: bowel sounds normal, soft, non tender to palpation, no masses, no rebound or guarding  GU/BREAST: Declined, does with GYN  SKIN: no rash or abnormal lesions  MS: normal gait, moves all extremities normally  NEURO: normal gait, speech and thought processing grossly intact, muscle tone grossly intact throughout  PSYCH: normal affect, pleasant and cooperative  ASSESSMENT AND PLAN:  Discussed the following assessment and plan:   1. Encounter for preventative adult health care examination PREVENTIVE EXAM: -Discussed and advised all [place] preventive services health task force level A and B recommendations for age, sex and risks. -Advised at least 150 minutes of exercise per week and a healthy diet with avoidance of (less then 1 serving per week) processed foods, white starches, red meat, fast foods and sweets and consisting of: * 5-9 servings of fresh fruits and vegetables (not corn or potatoes) *nuts and seeds, beans *olives and olive oil *lean meats such as fish and white chicken  *whole grains -labs, studies and vaccines per orders this encounter  2. Screening for depression -Negative  3. Essential hypertension -Continue current medication, did discuss stopping the diuretic in light of the gout, she opted to continue for now - Basic metabolic panel - CBC  4. Morbid obesity ([place]) -Discussed options for management -Prefers to try lifestyle changes, advised a Mediterranean diet and regular exercise -Advised to avoid, sugars and simple starches diet regular meals - Hemoglobin A1c - Lipid panel  5. Gout involving toe, unspecified cause, unspecified chronicity, unspecified laterality -Discussed implications of various options for management -She is declined any medications for prevention -Plans to work on weight loss,  healthy diet, dietary changes -Advised prompt follow-up if current flares  6. OSA on CPAP -Continue management specialist, advised weight reduction   Patient advised to return to clinic immediately if symptoms worsen or persist or new concerns.  Patient Instructions  BEFORE YOU LEAVE: -labs -follow up: 4-6 months  We have ordered labs or studies at this visit. It can take up to 1-2 weeks for results and processing. IF results require follow up or explanation, we will call you with instructions. Clinically stable results will be released to your [facility]. If you have not heard from [place] or cannot find your results in [facility] in 2 weeks please contact our office at [phone].  If you are not yet signed up for [facility], please consider signing up.   We recommend the following healthy lifestyle for LIFE: 1) Small portions. But, make sure to get regular (at least 3 per day), healthy meals and small healthy snacks if needed.  2) Eat a healthy clean diet.   TRY TO EAT: -at least 5-7 servings of low sugar, colorful, and nutrient rich vegetables per  day (not corn, potatoes or bananas.) -berries are the best choice if you wish to eat fruit (only eat small amounts if trying to reduce weight)  -lean meets (fish, white meat of chicken or [place]) -vegan proteins for some meals - beans or tofu, whole grains, nuts and seeds -Replace bad fats with good fats - good fats include: fish, nuts and seeds, canola oil, olive oil -small amounts of low fat or non fat dairy -small amounts of100 % whole grains - check the lables -drink plenty of water  AVOID: -SUGAR, sweets, anything with added sugar, corn syrup or sweeteners - must read labels as even foods advertised as "healthy" often are loaded with sugar -if you must have a sweetener, small amounts of stevia may be best -sweetened beverages and artificially sweetened beverages -simple starches (rice, bread, potatoes, pasta, chips, etc - small amounts of  100% whole grains are ok) -red meat, pork, butter -fried foods, fast food, processed food, excessive dairy, eggs and coconut.  3)Get at least 150 minutes of sweaty aerobic exercise per week.  4)Reduce stress - consider counseling, meditation and relaxation to balance other aspects of your life. Health Maintenance, Female Adopting a healthy lifestyle and getting preventive care can go a long way to promote health and wellness. Talk with your health care provider about what schedule of regular examinations is right for you. This is a good chance for you to check in with your provider about disease prevention and staying healthy. In between checkups, there are plenty of things you can do on your own. Experts have done a lot of research about which lifestyle changes and preventive measures are most likely to keep you healthy. Ask your health care provider for more information. Weight and diet Eat a healthy diet  Be sure to include plenty of vegetables, fruits, low-fat dairy products, and lean protein.  Do not eat a lot of foods high in solid fats, added sugars, or salt.  Get regular exercise. This is one of the most important things you can do for your health. ? Most adults should exercise for at least 150 minutes each week. The exercise should increase your heart rate and make you sweat (moderate-intensity exercise). ? Most adults should also do strengthening exercises at least twice a week. This is in addition to the moderate-intensity exercise.  Maintain a healthy weight  Body mass index (BMI) is a measurement that can be used to identify possible weight problems. It estimates body fat based on height and weight. Your health care provider can help determine your BMI and help you achieve or maintain a healthy weight.  For females 28 years of age and older: ? A BMI below 18.5 is considered underweight. ? A BMI of 18.5 to 24.9 is normal. ? A BMI of 25 to 29.9 is considered overweight. ? A  BMI of 30 and above is considered obese.  Watch levels of cholesterol and blood lipids  You should start having your blood tested for lipids and cholesterol at 60 years of age, then have this test every 5 years.  You may need to have your cholesterol levels checked more often if: ? Your lipid or cholesterol levels are high. ? You are older than 60 years of age. ? You are at high risk for heart disease.  Cancer screening Lung Cancer  Lung cancer screening is recommended for adults 78-54 years old who are at high risk for lung cancer because of a history of smoking.  A yearly low-dose  CT scan of the lungs is recommended for people who: ? Currently smoke. ? Have quit within the past 15 years. ? Have at least a 30-pack-year history of smoking. A pack year is smoking an average of one pack of cigarettes a day for 1 year.  Yearly screening should continue until it has been 15 years since you quit.  Yearly screening should stop if you develop a health problem that would prevent you from having lung cancer treatment.  Breast Cancer  Practice breast self-awareness. This means understanding how your breasts normally appear and feel.  It also means doing regular breast self-exams. Let your health care provider know about any changes, no matter how small.  If you are in your 20s or 30s, you should have a clinical breast exam (CBE) by a health care provider every 1-3 years as part of a regular health exam.  If you are 32 or older, have a CBE every year. Also consider having a breast X-ray (mammogram) every year.  If you have a family history of breast cancer, talk to your health care provider about genetic screening.  If you are at high risk for breast cancer, talk to your health care provider about having an MRI and a mammogram every year.  Breast cancer gene (BRCA) assessment is recommended for women who have family members with BRCA-related cancers. BRCA-related cancers  include: ? Breast. ? Ovarian. ? Tubal. ? Peritoneal cancers.  Results of the assessment will determine the need for genetic counseling and BRCA1 and BRCA2 testing.  Cervical Cancer Your health care provider may recommend that you be screened regularly for cancer of the pelvic organs (ovaries, uterus, and vagina). This screening involves a pelvic examination, including checking for microscopic changes to the surface of your cervix (Pap test). You may be encouraged to have this screening done every 3 years, beginning at age 39.  For women ages 55-65, health care providers may recommend pelvic exams and Pap testing every 3 years, or they may recommend the Pap and pelvic exam, combined with testing for human papilloma virus (HPV), every 5 years. Some types of HPV increase your risk of cervical cancer. Testing for HPV may also be done on women of any age with unclear Pap test results.  Other health care providers may not recommend any screening for nonpregnant women who are considered low risk for pelvic cancer and who do not have symptoms. Ask your health care provider if a screening pelvic exam is right for you.  If you have had past treatment for cervical cancer or a condition that could lead to cancer, you need Pap tests and screening for cancer for at least 20 years after your treatment. If Pap tests have been discontinued, your risk factors (such as having a new sexual partner) need to be reassessed to determine if screening should resume. Some women have medical problems that increase the chance of getting cervical cancer. In these cases, your health care provider may recommend more frequent screening and Pap tests.  Colorectal Cancer  This type of cancer can be detected and often prevented.  Routine colorectal cancer screening usually begins at 60 years of age and continues through 60 years of age.  Your health care provider may recommend screening at an earlier age if you have risk factors  for colon cancer.  Your health care provider may also recommend using home test kits to check for hidden blood in the stool.  A small camera at the end of a tube  can be used to examine your colon directly (sigmoidoscopy or colonoscopy). This is done to check for the earliest forms of colorectal cancer.  Routine screening usually begins at age 42.  Direct examination of the colon should be repeated every 5-10 years through 60 years of age. However, you may need to be screened more often if early forms of precancerous polyps or small growths are found.  Skin Cancer  Check your skin from head to toe regularly.  Tell your health care provider about any new moles or changes in moles, especially if there is a change in a mole's shape or color.  Also tell your health care provider if you have a mole that is larger than the size of a pencil eraser.  Always use sunscreen. Apply sunscreen liberally and repeatedly throughout the day.  Protect yourself by wearing long sleeves, pants, a wide-brimmed hat, and sunglasses whenever you are outside.  Heart disease, diabetes, and high blood pressure  High blood pressure causes heart disease and increases the risk of stroke. High blood pressure is more likely to develop in: ? People who have blood pressure in the high end of the normal range (130-139/85-89 mm Hg). ? People who are overweight or obese. ? People who are African American.  If you are 77-52 years of age, have your blood pressure checked every 3-5 years. If you are 42 years of age or older, have your blood pressure checked every year. You should have your blood pressure measured twice-once when you are at a hospital or clinic, and once when you are not at a hospital or clinic. Record the average of the two measurements. To check your blood pressure when you are not at a hospital or clinic, you can use: ? An automated blood pressure machine at a pharmacy. ? A home blood pressure monitor.  If  you are between 52 years and 74 years old, ask your health care provider if you should take aspirin to prevent strokes.  Have regular diabetes screenings. This involves taking a blood sample to check your fasting blood sugar level. ? If you are at a normal weight and have a low risk for diabetes, have this test once every three years after 60 years of age. ? If you are overweight and have a high risk for diabetes, consider being tested at a younger age or more often. Preventing infection Hepatitis B  If you have a higher risk for hepatitis B, you should be screened for this virus. You are considered at high risk for hepatitis B if: ? You were born in a country where hepatitis B is common. Ask your health care provider which countries are considered high risk. ? Your parents were born in a high-risk country, and you have not been immunized against hepatitis B (hepatitis B vaccine). ? You have HIV or AIDS. ? You use needles to inject street drugs. ? You live with someone who has hepatitis B. ? You have had sex with someone who has hepatitis B. ? You get hemodialysis treatment. ? You take certain medicines for conditions, including cancer, organ transplantation, and autoimmune conditions.  Hepatitis C  Blood testing is recommended for: ? Everyone born from [birth year] through 1964. ? Anyone with known risk factors for hepatitis C.  Sexually transmitted infections (STIs)  You should be screened for sexually transmitted infections (STIs) including gonorrhea and chlamydia if: ? You are sexually active and are younger than 60 years of age. ? You are older than 60 years  of age and your health care provider tells you that you are at risk for this type of infection. ? Your sexual activity has changed since you were last screened and you are at an increased risk for chlamydia or gonorrhea. Ask your health care provider if you are at risk.  If you do not have HIV, but are at risk, it may be recommended  that you take a prescription medicine daily to prevent HIV infection. This is called pre-exposure prophylaxis (PrEP). You are considered at risk if: ? You are sexually active and do not regularly use condoms or know the HIV status of your partner(s). ? You take drugs by injection. ? You are sexually active with a partner who has HIV.  Talk with your health care provider about whether you are at high risk of being infected with HIV. If you choose to begin PrEP, you should first be tested for HIV. You should then be tested every 3 months for as long as you are taking PrEP. Pregnancy  If you are premenopausal and you may become pregnant, ask your health care provider about preconception counseling.  If you may become pregnant, take 400 to 800 micrograms (mcg) of folic acid every day.  If you want to prevent pregnancy, talk to your health care provider about birth control (contraception). Osteoporosis and menopause  Osteoporosis is a disease in which the bones lose minerals and strength with aging. This can result in serious bone fractures. Your risk for osteoporosis can be identified using a bone density scan.  If you are 75 years of age or older, or if you are at risk for osteoporosis and fractures, ask your health care provider if you should be screened.  Ask your health care provider whether you should take a calcium or vitamin D supplement to lower your risk for osteoporosis.  Menopause may have certain physical symptoms and risks.  Hormone replacement therapy may reduce some of these symptoms and risks. Talk to your health care provider about whether hormone replacement therapy is right for you. Follow these instructions at home:  Schedule regular health, dental, and eye exams.  Stay current with your immunizations.  Do not use any tobacco products including cigarettes, chewing tobacco, or electronic cigarettes.  If you are pregnant, do not drink alcohol.  If you are  breastfeeding, limit how much and how often you drink alcohol.  Limit alcohol intake to no more than 1 drink per day for nonpregnant women. One drink equals 12 ounces of beer, 5 ounces of wine, or 1 ounces of hard liquor.  Do not use street drugs.  Do not share needles.  Ask your health care provider for help if you need support or information about quitting drugs.  Tell your health care provider if you often feel depressed.  Tell your health care provider if you have ever been abused or do not feel safe at home. This information is not intended to replace advice given to you by your health care provider. Make sure you discuss any questions you have with your health care provider. Document Released: 03/10/2011 Document Revised: 01/31/2016 Document Reviewed: 05/29/2015 Elsevier Interactive Patient Education  2018 [employer].          No Follow-up on file.  Colin Benton R., DO

## 2017-09-07 ENCOUNTER — Ambulatory Visit (INDEPENDENT_AMBULATORY_CARE_PROVIDER_SITE_OTHER): Payer: 59 | Admitting: Family Medicine

## 2017-09-07 ENCOUNTER — Encounter: Payer: Self-pay | Admitting: Family Medicine

## 2017-09-07 DIAGNOSIS — Z0000 Encounter for general adult medical examination without abnormal findings: Secondary | ICD-10-CM | POA: Diagnosis not present

## 2017-09-07 DIAGNOSIS — M109 Gout, unspecified: Secondary | ICD-10-CM | POA: Diagnosis not present

## 2017-09-07 DIAGNOSIS — G4733 Obstructive sleep apnea (adult) (pediatric): Secondary | ICD-10-CM | POA: Diagnosis not present

## 2017-09-07 DIAGNOSIS — Z9989 Dependence on other enabling machines and devices: Secondary | ICD-10-CM

## 2017-09-07 DIAGNOSIS — Z1331 Encounter for screening for depression: Secondary | ICD-10-CM | POA: Diagnosis not present

## 2017-09-07 DIAGNOSIS — I10 Essential (primary) hypertension: Secondary | ICD-10-CM | POA: Diagnosis not present

## 2017-09-07 LAB — BASIC METABOLIC PANEL
BUN: 13 mg/dL (ref 6–23)
CALCIUM: 9.2 mg/dL (ref 8.4–10.5)
CO2: 31 meq/L (ref 19–32)
Chloride: 102 mEq/L (ref 96–112)
Creatinine, Ser: 0.71 mg/dL (ref 0.40–1.20)
GFR: 89.19 mL/min (ref >60.00)
GLUCOSE: 101 mg/dL — AB (ref 70–99)
Potassium: 4.2 mEq/L (ref 3.5–5.1)
Sodium: 142 mEq/L (ref 135–145)

## 2017-09-07 LAB — CBC
HCT: 45.2 % (ref 36.0–46.0)
Hemoglobin: 15 g/dL (ref 12.0–15.0)
MCHC: 33.2 g/dL (ref 30.0–36.0)
MCV: 90.9 fl (ref 78.0–100.0)
Platelets: 224 10*3/uL (ref 150.0–400.0)
RBC: 4.97 Mil/uL (ref 3.87–5.11)
RDW: 14.1 % (ref 11.5–15.5)
WBC: 6.3 10*3/uL (ref 4.0–10.5)

## 2017-09-07 LAB — HEMOGLOBIN A1C: HEMOGLOBIN A1C: 6.1 % (ref 4.6–6.5)

## 2017-09-07 LAB — LIPID PANEL
CHOLESTEROL: 186 mg/dL (ref 0–200)
HDL: 51.8 mg/dL (ref >39.00)
LDL CALC: 108 mg/dL — AB (ref 0–99)
NonHDL: 134.25
Total CHOL/HDL Ratio: 4
Triglycerides: 131 mg/dL (ref 0.0–149.0)
VLDL: 26.2 mg/dL (ref 0.0–40.0)

## 2017-09-07 NOTE — Patient Instructions (Signed)
BEFORE YOU LEAVE: -labs -follow up: 4-6 months  We have ordered labs or studies at this visit. It can take up to 1-2 weeks for results and processing. IF results require follow up or explanation, we will call you with instructions. Clinically stable results will be released to your [facility]. If you have not heard from [place] or cannot find your results in [facility] in 2 weeks please contact our office at [phone].  If you are not yet signed up for [facility], please consider signing up.   We recommend the following healthy lifestyle for LIFE: 1) Small portions. But, make sure to get regular (at least 3 per day), healthy meals and small healthy snacks if needed.  2) Eat a healthy clean diet.   TRY TO EAT: -at least 5-7 servings of low sugar, colorful, and nutrient rich vegetables per day (not corn, potatoes or bananas.) -berries are the best choice if you wish to eat fruit (only eat small amounts if trying to reduce weight)  -lean meets (fish, white meat of chicken or [place]) -vegan proteins for some meals - beans or tofu, whole grains, nuts and seeds -Replace bad fats with good fats - good fats include: fish, nuts and seeds, canola oil, olive oil -small amounts of low fat or non fat dairy -small amounts of100 % whole grains - check the lables -drink plenty of water  AVOID: -SUGAR, sweets, anything with added sugar, corn syrup or sweeteners - must read labels as even foods advertised as "healthy" often are loaded with sugar -if you must have a sweetener, small amounts of stevia may be best -sweetened beverages and artificially sweetened beverages -simple starches (rice, bread, potatoes, pasta, chips, etc - small amounts of 100% whole grains are ok) -red meat, pork, butter -fried foods, fast food, processed food, excessive dairy, eggs and coconut.  3)Get at least 150 minutes of sweaty aerobic exercise per week.  4)Reduce stress - consider counseling, meditation and relaxation to balance  other aspects of your life. Health Maintenance, Female Adopting a healthy lifestyle and getting preventive care can go a long way to promote health and wellness. Talk with your health care provider about what schedule of regular examinations is right for you. This is a good chance for you to check in with your provider about disease prevention and staying healthy. In between checkups, there are plenty of things you can do on your own. Experts have done a lot of research about which lifestyle changes and preventive measures are most likely to keep you healthy. Ask your health care provider for more information. Weight and diet Eat a healthy diet  Be sure to include plenty of vegetables, fruits, low-fat dairy products, and lean protein.  Do not eat a lot of foods high in solid fats, added sugars, or salt.  Get regular exercise. This is one of the most important things you can do for your health. ? Most adults should exercise for at least 150 minutes each week. The exercise should increase your heart rate and make you sweat (moderate-intensity exercise). ? Most adults should also do strengthening exercises at least twice a week. This is in addition to the moderate-intensity exercise.  Maintain a healthy weight  Body mass index (BMI) is a measurement that can be used to identify possible weight problems. It estimates body fat based on height and weight. Your health care provider can help determine your BMI and help you achieve or maintain a healthy weight.  For females 22 years of age  and older: ? A BMI below 18.5 is considered underweight. ? A BMI of 18.5 to 24.9 is normal. ? A BMI of 25 to 29.9 is considered overweight. ? A BMI of 30 and above is considered obese.  Watch levels of cholesterol and blood lipids  You should start having your blood tested for lipids and cholesterol at 60 years of age, then have this test every 5 years.  You may need to have your cholesterol levels checked more  often if: ? Your lipid or cholesterol levels are high. ? You are older than 60 years of age. ? You are at high risk for heart disease.  Cancer screening Lung Cancer  Lung cancer screening is recommended for adults 14-61 years old who are at high risk for lung cancer because of a history of smoking.  A yearly low-dose CT scan of the lungs is recommended for people who: ? Currently smoke. ? Have quit within the past 15 years. ? Have at least a 30-pack-year history of smoking. A pack year is smoking an average of one pack of cigarettes a day for 1 year.  Yearly screening should continue until it has been 15 years since you quit.  Yearly screening should stop if you develop a health problem that would prevent you from having lung cancer treatment.  Breast Cancer  Practice breast self-awareness. This means understanding how your breasts normally appear and feel.  It also means doing regular breast self-exams. Let your health care provider know about any changes, no matter how small.  If you are in your 20s or 30s, you should have a clinical breast exam (CBE) by a health care provider every 1-3 years as part of a regular health exam.  If you are 60 or older, have a CBE every year. Also consider having a breast X-ray (mammogram) every year.  If you have a family history of breast cancer, talk to your health care provider about genetic screening.  If you are at high risk for breast cancer, talk to your health care provider about having an MRI and a mammogram every year.  Breast cancer gene (BRCA) assessment is recommended for women who have family members with BRCA-related cancers. BRCA-related cancers include: ? Breast. ? Ovarian. ? Tubal. ? Peritoneal cancers.  Results of the assessment will determine the need for genetic counseling and BRCA1 and BRCA2 testing.  Cervical Cancer Your health care provider may recommend that you be screened regularly for cancer of the pelvic organs  (ovaries, uterus, and vagina). This screening involves a pelvic examination, including checking for microscopic changes to the surface of your cervix (Pap test). You may be encouraged to have this screening done every 3 years, beginning at age 42.  For women ages 72-65, health care providers may recommend pelvic exams and Pap testing every 3 years, or they may recommend the Pap and pelvic exam, combined with testing for human papilloma virus (HPV), every 5 years. Some types of HPV increase your risk of cervical cancer. Testing for HPV may also be done on women of any age with unclear Pap test results.  Other health care providers may not recommend any screening for nonpregnant women who are considered low risk for pelvic cancer and who do not have symptoms. Ask your health care provider if a screening pelvic exam is right for you.  If you have had past treatment for cervical cancer or a condition that could lead to cancer, you need Pap tests and screening for cancer for  at least 20 years after your treatment. If Pap tests have been discontinued, your risk factors (such as having a new sexual partner) need to be reassessed to determine if screening should resume. Some women have medical problems that increase the chance of getting cervical cancer. In these cases, your health care provider may recommend more frequent screening and Pap tests.  Colorectal Cancer  This type of cancer can be detected and often prevented.  Routine colorectal cancer screening usually begins at 60 years of age and continues through 60 years of age.  Your health care provider may recommend screening at an earlier age if you have risk factors for colon cancer.  Your health care provider may also recommend using home test kits to check for hidden blood in the stool.  A small camera at the end of a tube can be used to examine your colon directly (sigmoidoscopy or colonoscopy). This is done to check for the earliest forms of  colorectal cancer.  Routine screening usually begins at age 1.  Direct examination of the colon should be repeated every 5-10 years through 60 years of age. However, you may need to be screened more often if early forms of precancerous polyps or small growths are found.  Skin Cancer  Check your skin from head to toe regularly.  Tell your health care provider about any new moles or changes in moles, especially if there is a change in a mole's shape or color.  Also tell your health care provider if you have a mole that is larger than the size of a pencil eraser.  Always use sunscreen. Apply sunscreen liberally and repeatedly throughout the day.  Protect yourself by wearing long sleeves, pants, a wide-brimmed hat, and sunglasses whenever you are outside.  Heart disease, diabetes, and high blood pressure  High blood pressure causes heart disease and increases the risk of stroke. High blood pressure is more likely to develop in: ? People who have blood pressure in the high end of the normal range (130-139/85-89 mm Hg). ? People who are overweight or obese. ? People who are African American.  If you are 79-68 years of age, have your blood pressure checked every 3-5 years. If you are 21 years of age or older, have your blood pressure checked every year. You should have your blood pressure measured twice-once when you are at a hospital or clinic, and once when you are not at a hospital or clinic. Record the average of the two measurements. To check your blood pressure when you are not at a hospital or clinic, you can use: ? An automated blood pressure machine at a pharmacy. ? A home blood pressure monitor.  If you are between 36 years and 37 years old, ask your health care provider if you should take aspirin to prevent strokes.  Have regular diabetes screenings. This involves taking a blood sample to check your fasting blood sugar level. ? If you are at a normal weight and have a low risk  for diabetes, have this test once every three years after 60 years of age. ? If you are overweight and have a high risk for diabetes, consider being tested at a younger age or more often. Preventing infection Hepatitis B  If you have a higher risk for hepatitis B, you should be screened for this virus. You are considered at high risk for hepatitis B if: ? You were born in a country where hepatitis B is common. Ask your health care provider  which countries are considered high risk. ? Your parents were born in a high-risk country, and you have not been immunized against hepatitis B (hepatitis B vaccine). ? You have HIV or AIDS. ? You use needles to inject street drugs. ? You live with someone who has hepatitis B. ? You have had sex with someone who has hepatitis B. ? You get hemodialysis treatment. ? You take certain medicines for conditions, including cancer, organ transplantation, and autoimmune conditions.  Hepatitis C  Blood testing is recommended for: ? Everyone born from [birth year] through 1964. ? Anyone with known risk factors for hepatitis C.  Sexually transmitted infections (STIs)  You should be screened for sexually transmitted infections (STIs) including gonorrhea and chlamydia if: ? You are sexually active and are younger than 60 years of age. ? You are older than 60 years of age and your health care provider tells you that you are at risk for this type of infection. ? Your sexual activity has changed since you were last screened and you are at an increased risk for chlamydia or gonorrhea. Ask your health care provider if you are at risk.  If you do not have HIV, but are at risk, it may be recommended that you take a prescription medicine daily to prevent HIV infection. This is called pre-exposure prophylaxis (PrEP). You are considered at risk if: ? You are sexually active and do not regularly use condoms or know the HIV status of your partner(s). ? You take drugs by  injection. ? You are sexually active with a partner who has HIV.  Talk with your health care provider about whether you are at high risk of being infected with HIV. If you choose to begin PrEP, you should first be tested for HIV. You should then be tested every 3 months for as long as you are taking PrEP. Pregnancy  If you are premenopausal and you may become pregnant, ask your health care provider about preconception counseling.  If you may become pregnant, take 400 to 800 micrograms (mcg) of folic acid every day.  If you want to prevent pregnancy, talk to your health care provider about birth control (contraception). Osteoporosis and menopause  Osteoporosis is a disease in which the bones lose minerals and strength with aging. This can result in serious bone fractures. Your risk for osteoporosis can be identified using a bone density scan.  If you are 61 years of age or older, or if you are at risk for osteoporosis and fractures, ask your health care provider if you should be screened.  Ask your health care provider whether you should take a calcium or vitamin D supplement to lower your risk for osteoporosis.  Menopause may have certain physical symptoms and risks.  Hormone replacement therapy may reduce some of these symptoms and risks. Talk to your health care provider about whether hormone replacement therapy is right for you. Follow these instructions at home:  Schedule regular health, dental, and eye exams.  Stay current with your immunizations.  Do not use any tobacco products including cigarettes, chewing tobacco, or electronic cigarettes.  If you are pregnant, do not drink alcohol.  If you are breastfeeding, limit how much and how often you drink alcohol.  Limit alcohol intake to no more than 1 drink per day for nonpregnant women. One drink equals 12 ounces of beer, 5 ounces of wine, or 1 ounces of hard liquor.  Do not use street drugs.  Do not share needles.  Ask  your  health care provider for help if you need support or information about quitting drugs.  Tell your health care provider if you often feel depressed.  Tell your health care provider if you have ever been abused or do not feel safe at home. This information is not intended to replace advice given to you by your health care provider. Make sure you discuss any questions you have with your health care provider. Document Released: 03/10/2011 Document Revised: 01/31/2016 Document Reviewed: 05/29/2015 Elsevier Interactive Patient Education  [employer].

## 2017-09-09 ENCOUNTER — Telehealth: Payer: Self-pay | Admitting: Family Medicine

## 2017-09-09 DIAGNOSIS — G4733 Obstructive sleep apnea (adult) (pediatric): Secondary | ICD-10-CM | POA: Diagnosis not present

## 2017-09-09 MED ORDER — VALSARTAN-HYDROCHLOROTHIAZIDE 80-12.5 MG PO TABS: 1.0000 | ORAL_TABLET | Freq: Every day | ORAL | 2 refills | Status: DC

## 2017-09-09 NOTE — Telephone Encounter (Signed)
Copied from [place] [id number]. Topic: Quick Communication - Rx Refill/Question >> Sep 09, 2017 11:23 AM Lennox Solders wrote: Has the patient contacted their pharmacy? yes (Agent: If no, request that the patient contact the pharmacy for the refill.) pt needs refills on valsartan-hctz patient was given 30 day supply only . Pt was seen on 09-07-17   Preferred Pharmacy (with phone number or street name):[facility] [phone]  Agent: Please be advised that RX refills may take up to 3 business days. We ask that you follow-up with your pharmacy.

## 2017-09-09 NOTE — Telephone Encounter (Signed)
Rx done. 

## 2017-09-14 ENCOUNTER — Telehealth: Payer: Self-pay | Admitting: Neurology

## 2017-09-14 DIAGNOSIS — G4733 Obstructive sleep apnea (adult) (pediatric): Secondary | ICD-10-CM

## 2017-09-14 NOTE — Telephone Encounter (Signed)
Pt was given a new mask through [facility], pt states that after 2 1/2 hours she is taken it off. Pt complained of not enough air pressure. Dr Brett Fairy has looked at her report and the patient is having some apneas. She will adjust the machine since it is a auto capable machine to 6/12 cm of water pressure. New orders will be placed and sent over to [facility]. Pt verbalized understanding.

## 2017-09-14 NOTE — Telephone Encounter (Signed)
Pt is wanting a call back from RN to discuss pt is thinking she needs her air pressure increased on her sleep apnea machine.

## 2017-09-17 ENCOUNTER — Encounter: Payer: Self-pay | Admitting: Family Medicine

## 2017-10-09 ENCOUNTER — Other Ambulatory Visit: Payer: Self-pay | Admitting: Family Medicine

## 2017-12-18 ENCOUNTER — Ambulatory Visit: Payer: 59 | Admitting: Podiatry

## 2017-12-18 ENCOUNTER — Other Ambulatory Visit: Payer: Self-pay | Admitting: Podiatry

## 2017-12-18 ENCOUNTER — Ambulatory Visit (INDEPENDENT_AMBULATORY_CARE_PROVIDER_SITE_OTHER): Payer: 59

## 2017-12-18 ENCOUNTER — Encounter: Payer: Self-pay | Admitting: Podiatry

## 2017-12-18 VITALS — BP 125/73 | HR 71 | Temp 97.6°F | Resp 18

## 2017-12-18 DIAGNOSIS — M109 Gout, unspecified: Secondary | ICD-10-CM | POA: Diagnosis not present

## 2017-12-18 DIAGNOSIS — M779 Enthesopathy, unspecified: Secondary | ICD-10-CM

## 2017-12-18 LAB — BASIC METABOLIC PANEL
BUN: 15 mg/dL (ref 7–25)
CHLORIDE: 103 mmol/L (ref 98–110)
CO2: 30 mmol/L (ref 20–32)
CREATININE: 0.62 mg/dL (ref 0.50–0.99)
Calcium: 9.5 mg/dL (ref 8.6–10.4)
Glucose, Bld: 85 mg/dL (ref 65–99)
POTASSIUM: 4 mmol/L (ref 3.5–5.3)
Sodium: 141 mmol/L (ref 135–146)

## 2017-12-18 LAB — URIC ACID: URIC ACID, SERUM: 8.5 mg/dL — AB (ref 2.5–7.0)

## 2017-12-18 MED ORDER — COLCHICINE 0.6 MG PO TABS: 0.6000 mg | ORAL_TABLET | Freq: Every day | ORAL | 0 refills | Status: DC

## 2017-12-18 NOTE — Patient Instructions (Signed)

## 2017-12-21 NOTE — Progress Notes (Signed)
Subjective: 62 year old female presents the office today for concerns of a gout flare to the right foot.  She states that over the last couple days she has noticed increasing redness and pain and swelling along the medial aspect of the first metatarsal head where she points to on the bunion.  Abstract however she did recently stop this.Denies any recent injury or trauma.  No recent treatment.  She was taking. Denies any systemic complaints such as fevers, chills, nausea, vomiting. No acute changes since last appointment, and no other complaints at this time.   Objective: AAO x3, NAD DP/PT pulses palpable bilaterally, CRT less than 3 seconds There is edema and erythema on the medial aspect of the first metatarsal head on the right foot and there is tenderness palpation directly along this area.  There is no fluctuation or crepitation.  There is no ascending cellulitis.  No other area tenderness. No open lesions or pre-ulcerative lesions.  No pain with calf compression, swelling, warmth, erythema  Assessment: Capsulitis, likely gout right foot  Plan: -All treatment options discussed with the patient including all alternatives, risks, complications.  -X-rays were obtained and reviewed.  No evidence of acute fracture there is no changes since last appointment.  Discussed a steroid injection and she wishes to proceed.  The skin was prepped with Betadine and then a mixture of 1 cc of Kenalog 10, 0.5 cc of 0.5% Marcaine plain and 0.5 cc of 2% lidocaine plain was infiltrated into and around the first MPJ without any complications.  Postinjection care was discussed.  Also will order blood work including ESR, CRP, uric acid. -Patient encouraged to call the office with any questions, concerns, change in symptoms.    Trula Slade DPM  Addendum: I did call the patient with a blood work on Friday afternoon.  Prescribed colchicine also discussed with her primary care physician.

## 2018-01-13 DIAGNOSIS — G4733 Obstructive sleep apnea (adult) (pediatric): Secondary | ICD-10-CM | POA: Diagnosis not present

## 2018-03-08 ENCOUNTER — Ambulatory Visit: Payer: 59 | Admitting: Family Medicine

## 2018-03-13 ENCOUNTER — Other Ambulatory Visit: Payer: Self-pay | Admitting: Podiatry

## 2018-03-15 ENCOUNTER — Telehealth: Payer: Self-pay | Admitting: Podiatry

## 2018-03-15 MED ORDER — COLCHICINE 0.6 MG PO TABS: 0.6000 mg | ORAL_TABLET | Freq: Every day | ORAL | 0 refills | Status: DC

## 2018-03-15 NOTE — Addendum Note (Signed)
Addended by: Harriett Sine D on: 03/15/2018 04:08 PM   Modules accepted: Orders

## 2018-03-15 NOTE — Telephone Encounter (Signed)
Pt called and stated that pharmacy has sent over 3 faxes for a refill on her Gout medication. Please give patient a call.

## 2018-03-15 NOTE — Telephone Encounter (Signed)
Dr. Jacqualyn Posey states refill as previously and pt needs an appt prior to future refills.

## 2018-03-16 ENCOUNTER — Encounter: Payer: Self-pay | Admitting: Family Medicine

## 2018-03-16 ENCOUNTER — Ambulatory Visit: Payer: 59 | Admitting: Family Medicine

## 2018-03-16 VITALS — BP 148/94 | HR 98 | Temp 98.2°F | Wt 269.0 lb

## 2018-03-16 DIAGNOSIS — G4452 New daily persistent headache (NDPH): Secondary | ICD-10-CM

## 2018-03-16 LAB — CBC WITH DIFFERENTIAL/PLATELET
BASOS PCT: 0.8 % (ref 0.0–3.0)
Basophils Absolute: 0 10*3/uL (ref 0.0–0.1)
EOS ABS: 0.3 10*3/uL (ref 0.0–0.7)
Eosinophils Relative: 5.3 % — ABNORMAL HIGH (ref 0.0–5.0)
HCT: 43.6 % (ref 36.0–46.0)
Hemoglobin: 14.9 g/dL (ref 12.0–15.0)
Lymphocytes Relative: 35 % (ref 12.0–46.0)
Lymphs Abs: 2.2 10*3/uL (ref 0.7–4.0)
MCHC: 34.3 g/dL (ref 30.0–36.0)
MCV: 88.7 fl (ref 78.0–100.0)
MONO ABS: 0.4 10*3/uL (ref 0.1–1.0)
Monocytes Relative: 6.8 % (ref 3.0–12.0)
NEUTROS ABS: 3.2 10*3/uL (ref 1.4–7.7)
Neutrophils Relative %: 52.1 % (ref 43.0–77.0)
PLATELETS: 258 10*3/uL (ref 150.0–400.0)
RBC: 4.91 Mil/uL (ref 3.87–5.11)
RDW: 13.6 % (ref 11.5–15.5)
WBC: 6.2 10*3/uL (ref 4.0–10.5)

## 2018-03-16 LAB — C-REACTIVE PROTEIN: CRP: 0.7 mg/dL (ref 0.5–20.0)

## 2018-03-16 LAB — BASIC METABOLIC PANEL
BUN: 14 mg/dL (ref 6–23)
CALCIUM: 9.5 mg/dL (ref 8.4–10.5)
CO2: 30 mEq/L (ref 19–32)
Chloride: 103 mEq/L (ref 96–112)
Creatinine, Ser: 0.74 mg/dL (ref 0.40–1.20)
GFR: 84.88 mL/min (ref >60.00)
Glucose, Bld: 121 mg/dL — ABNORMAL HIGH (ref 70–99)
POTASSIUM: 3.8 meq/L (ref 3.5–5.1)
SODIUM: 141 meq/L (ref 135–145)

## 2018-03-16 LAB — SEDIMENTATION RATE: Sed Rate: 36 mm/hr — ABNORMAL HIGH (ref 0–30)

## 2018-03-16 MED ORDER — KETOROLAC TROMETHAMINE 60 MG/2ML IM SOLN
60.0000 mg | Freq: Once | INTRAMUSCULAR | Status: AC
  Administered 2018-03-16: 60 mg via INTRAMUSCULAR

## 2018-03-16 MED ORDER — ONDANSETRON HCL 4 MG PO TABS: 4.0000 mg | ORAL_TABLET | Freq: Three times a day (TID) | ORAL | 0 refills | Status: DC | PRN

## 2018-03-16 NOTE — Patient Instructions (Signed)

## 2018-03-16 NOTE — Progress Notes (Signed)
Subjective:    Patient ID: Gabrielle Moore, female    DOB: Sep 26, 1955, 62 y.o.   MRN: [id number]  No chief complaint on file.   HPI Patient was seen today for ongoing.  Pt endorses headache x 2 months.  Presented today as not able to go to work 2/2 the discomfort.  Pt states the left side of her head and pain behind her L eye started as a dull ache that has become constant.  Feels like "a bowling ball was thrown at my head".  Sleep made the HA better, now seems to have the HA when she wakes up. Pt endorses sensitivity to noise and nausea.  She denies light sensitivity, change in vision, strange smells before her HA, or recent cold symptoms.  Tried BC powder and Excedrin Migraine without relief.  Able to drive to clinic.  Endorses taking bp med valsartan-HCTZ 80-12.5 mg daily.  Got new contacts 3 months ago, does not think the rx changed.  Pt states her nephew had a brain tumor.  Past Medical History:  Diagnosis Date  . Asthma    as a child  . Hypertension   . Seasonal allergies   . Sleep apnea     Allergies  Allergen Reactions  . Codeine Nausea Only    ROS General: Denies fever, chills, night sweats, changes in weight, changes in appetite HEENT: Denies ear pain, changes in vision, rhinorrhea, sore throat   +HA CV: Denies CP, palpitations, SOB, orthopnea Pulm: Denies SOB, cough, wheezing GI: Denies abdominal pain, vomiting, diarrhea, constipation  +nausea GU: Denies dysuria, hematuria, frequency, vaginal discharge Msk: Denies muscle cramps, joint pains Neuro: Denies weakness, numbness, tingling Skin: Denies rashes, bruising Psych: Denies depression, anxiety, hallucinations     Objective:    Blood pressure (!) 148/94, pulse 98, temperature 98.2 F (36.8 C), temperature source Oral, weight 269 lb (122 kg), SpO2 95 %.   Gen. Pleasant, well-nourished, in no distress, normal affect   HEENT: [place]/AT, face symmetric, conjunctiva clear, no scleral icterus, PERRLA, EOMI, no  nystagmus, nares patent without drainage, pharynx without erythema or exudate. TMs nml with cerumen in canals.  TTP of L temple.  No cervical lymphadenopathy. No TTP of neck, negative Brudzinski's sign. Lungs: no accessory muscle use, CTAB, no wheezes or rales Cardiovascular: RRR, no m/r/g, no peripheral edema Neuro:  A&Ox3, CN II-XII intact, normal gait   Wt Readings from Last 3 Encounters:  03/16/18 269 lb (122 kg)  09/07/17 262 lb 3.2 oz (118.9 kg)  06/15/17 255 lb (115.7 kg)    Lab Results  Component Value Date   WBC 6.3 09/07/2017   HGB 15.0 09/07/2017   HCT 45.2 09/07/2017   PLT 224.0 09/07/2017   GLUCOSE 85 12/18/2017   CHOL 186 09/07/2017   TRIG 131.0 09/07/2017   HDL 51.80 09/07/2017   LDLCALC 108 (H) 09/07/2017   ALT 42 (H) 06/28/2007   AST 31 06/28/2007   NA 141 12/18/2017   K 4.0 12/18/2017   CL 103 12/18/2017   CREATININE 0.62 12/18/2017   BUN 15 12/18/2017   CO2 30 12/18/2017   TSH 1.85 07/19/2012   HGBA1C 6.1 09/07/2017    Assessment/Plan:  New daily persistent headache  -ddx: migraine, temporal arteritis, space occupying lesion. -stable neuro exam -will obtain lab work -will need imaging based on new onset HA after age 24 and waking up with HA. Will await lab results. - Plan: ketorolac (TORADOL) injection 60 mg, CBC with Differential/Platelet, Basic metabolic panel, C-reactive Protein,  Sedimentation Rate, ondansetron (ZOFRAN) 4 MG tablet -given RTC or ED precautions for worsening symptoms.  F/u in 2 days with pcp  Grier Mitts, MD

## 2018-03-18 ENCOUNTER — Encounter: Payer: Self-pay | Admitting: Family Medicine

## 2018-03-18 ENCOUNTER — Other Ambulatory Visit: Payer: Self-pay | Admitting: Family Medicine

## 2018-03-18 ENCOUNTER — Ambulatory Visit: Payer: 59 | Admitting: Family Medicine

## 2018-03-18 ENCOUNTER — Ambulatory Visit (HOSPITAL_COMMUNITY)
Admission: RE | Admit: 2018-03-18 | Discharge: 2018-03-18 | Disposition: A | Discharge status: Home / Self Care | Payer: 59 | Source: Ambulatory Visit | Attending: Family Medicine | Admitting: Family Medicine

## 2018-03-18 DIAGNOSIS — R51 Headache: Secondary | ICD-10-CM | POA: Insufficient documentation

## 2018-03-18 DIAGNOSIS — R519 Headache, unspecified: Secondary | ICD-10-CM

## 2018-03-18 DIAGNOSIS — R9389 Abnormal findings on diagnostic imaging of other specified body structures: Secondary | ICD-10-CM | POA: Insufficient documentation

## 2018-03-18 MED ORDER — GADOBENATE DIMEGLUMINE 529 MG/ML IV SOLN: 20.0000 mL | Freq: Once | INTRAVENOUS | Status: DC | PRN

## 2018-03-18 NOTE — Patient Instructions (Signed)
BEFORE YOU LEAVE: -ordered urgent MRI -follow up: 1-2 months  Get MRI.  -We placed a referral for you as discussed to the neurologist and also for MRI.If you have not heard from [place] regarding this appointment in the next 1 week please contact our office.  Limit over the counter pain medications to no more then 2 days per week.  Try ice and tiger balm topical - do not get in the eye.  I hope you are feeling better soon! Seek emergency care promptly if your symptoms worsen or new concerns arise.

## 2018-03-18 NOTE — Progress Notes (Signed)
HPI:  Using dictation device. Unfortunately this device frequently misinterprets words/phrases.  Acute visit for headache: -daily, constant, dull, 6/10, L periorbital x 2 months -some accompanying nausea and sometimes sensitivity to sound -no fevers, malaise, wt loss, vision changes, weakness, numbness, sinus issues, neck stiffness, vomiting or other symptoms -denies prior hx migraines or FH migraines -did have headaches occ in the past that resolved with goodies -has tried goodies, excedrin, ect - helps a little -zofran helps, toradol helped some but not resolved -had labs recently, only mild elevation ESR - o/w ok  ROS: See pertinent positives and negatives per HPI.  Past Medical History:  Diagnosis Date  . Asthma    as a child  . Hypertension   . Seasonal allergies   . Sleep apnea     Past Surgical History:  Procedure Laterality Date  . BREAST SURGERY  1998   breast reduction  . CESAREAN SECTION  1996  . LAPAROSCOPIC GASTRIC BANDING  2009  . LAPAROSCOPIC GASTRIC BANDING  2009  . SHOULDER SURGERY    . TONSILLECTOMY AND ADENOIDECTOMY      Family History  Problem Relation Age of Onset  . Hyperlipidemia Mother   . Hypertension Mother   . Hyperlipidemia Father   . Hypertension Father   . Heart disease Father 58  . Heart attack Father   . Heart disease Unknown        parent     SOCIAL HX: see hpi   Current Outpatient Medications:  .  colchicine 0.6 MG tablet, Take 1 tablet (0.6 mg total) by mouth daily., Disp: 10 tablet, Rfl: 0 .  loratadine (CLARITIN) 10 MG tablet, Take 10 mg by mouth daily., Disp: , Rfl:  .  ondansetron (ZOFRAN) 4 MG tablet, Take 1 tablet (4 mg total) by mouth every 8 (eight) hours as needed for nausea or vomiting., Disp: 10 tablet, Rfl: 0 .  valsartan-hydrochlorothiazide (DIOVAN-HCT) 80-12.5 MG tablet, TAKE 1 TABLET BY MOUTH DAILY, Disp: 90 tablet, Rfl: 3  EXAM:  Vitals:   03/18/18 0812  BP: 124/80  Pulse: 81  Temp: 98.3 F (36.8 C)   SpO2: 95%    Body mass index is 43.79 kg/m.  GENERAL: vitals reviewed and listed above, alert, oriented, appears well hydrated and in no acute distress  HEENT: atraumatic, conjunttiva clear, visual acuity grossly intact, PERRLA, no obvious abnormalities on inspection of external nose and ears, normal appearance of ear canals and TMs, clear nasal congestion, mild post oropharyngeal erythema with PND, no tonsillar edema or exudate, no sinus TTP, no temporal artery tenderness to palpation or bruits  NECK: no obvious masses on inspection, no meningeal signs, no bruits  LUNGS: clear to auscultation bilaterally, no wheezes, rales or rhonchi, good air movement  CV: HRRR, no peripheral edema  MS: moves all extremities without noticeable abnormality  PSYCH: pleasant and cooperative, no obvious depression or anxiety, speech and thought processing grossly intact, cranial nerves II through XII grossly intact, finger-to-nose normal  ASSESSMENT AND PLAN:  Discussed the following assessment and plan: More than 50% of over 25 minutes spent in total in caring for this patient was spent face-to-face with the patient, counseling and/or coordinating care.   Intractable headache, unspecified chronicity pattern, unspecified headache type - Plan: MR Brain W Wo Contrast, Ambulatory referral to Neurology  -we discussed possible serious and likely etiologies, workup and treatment, treatment risks and return precautions, discussed lab results -after this discussion, Meshelle opted for: MRI, referral to neurology, trial of ice and topical  products reduction in daily over-the-counter analgesics, she declined MRA/neck imaging today -agrees to go to the emergency room promptly if symptoms worsen or new concerns arise -follow up advised 1 month -of course, we advised Mirabelle  to return or notify a doctor immediately if symptoms worsen or persist or new concerns arise.    Patient Instructions  BEFORE YOU  LEAVE: -ordered urgent MRI -follow up: 1-2 months  Get MRI.  -We placed a referral for you as discussed to the neurologist and also for MRI.If you have not heard from [place] regarding this appointment in the next 1 week please contact our office.  Limit over the counter pain medications to no more then 2 days per week.  Try ice and tiger balm topical - do not get in the eye.  I hope you are feeling better soon! Seek emergency care promptly if your symptoms worsen or new concerns arise.     Lucretia Kern, DO

## 2018-03-18 NOTE — Addendum Note (Signed)
Addended by: Agnes Lawrence on: 03/18/2018 09:17 AM   Modules accepted: Orders

## 2018-03-19 ENCOUNTER — Encounter: Payer: Self-pay | Admitting: Family Medicine

## 2018-03-19 ENCOUNTER — Telehealth: Payer: Self-pay | Admitting: *Deleted

## 2018-03-19 DIAGNOSIS — R9089 Other abnormal findings on diagnostic imaging of central nervous system: Secondary | ICD-10-CM

## 2018-03-19 DIAGNOSIS — R51 Headache: Principal | ICD-10-CM

## 2018-03-19 DIAGNOSIS — R519 Headache, unspecified: Secondary | ICD-10-CM

## 2018-03-19 NOTE — Telephone Encounter (Signed)
Per Dr Maudie Mercury, I called the pt and informed her the MRI looks good and shows a sinus issue on the side that is causing her pain.  Per Dr Maudie Mercury the radiologist recommended an ENT evaluation and she is aware the referral was placed and someone will call with appt info.  Patient asked if she should keep the appt with neurology? Message sent to Dr Maudie Mercury and the pt is aware we will contact her on Monday.

## 2018-03-22 NOTE — Telephone Encounter (Signed)
I LM for pt to call if further questions and to let us know if she does not have ENT visit today or soon.  Advised would defer to her and ENT eval on neurology eval - depending on if ENT feels this is the source of her symptoms.

## 2018-03-22 NOTE — Telephone Encounter (Signed)
Patient called back and was informed of the message below.  She stated she did not want to see Dr Lucia Gaskins due to his hands shaking when a family member of hers was seen by him.  She stated she scheduled an appt with Dr Wilburn Cornelia and they cannot see her for another 2 weeks and asked if our office could get her in sooner.  I spoke with our referral coordinator and she stated the appt she has is sooner than most.  I informed the pt of this and advised she call their office to check for cancellations and she agreed.

## 2018-03-31 DIAGNOSIS — G8929 Other chronic pain: Secondary | ICD-10-CM | POA: Insufficient documentation

## 2018-03-31 DIAGNOSIS — J3489 Other specified disorders of nose and nasal sinuses: Secondary | ICD-10-CM | POA: Diagnosis not present

## 2018-03-31 DIAGNOSIS — R519 Headache, unspecified: Secondary | ICD-10-CM | POA: Insufficient documentation

## 2018-03-31 DIAGNOSIS — J322 Chronic ethmoidal sinusitis: Secondary | ICD-10-CM | POA: Insufficient documentation

## 2018-03-31 DIAGNOSIS — R51 Headache: Secondary | ICD-10-CM | POA: Diagnosis not present

## 2018-04-05 ENCOUNTER — Telehealth: Payer: Self-pay | Admitting: Podiatry

## 2018-04-05 NOTE — Telephone Encounter (Signed)
I'm a pt of Dr. Leigh [place] and I've already called my pharmacy to get a refill of the medication for gout as I'm having another gout flair. I wanted to speak with the nurse about maybe getting something stronger because this is the second time its happened this month. Also, have something that can be refilled automatically without having to wait on the doctors authorization because when an outbreak on Saturday I have to wait until Monday before they can call anything in. If you could please call me back at [phone]. Thank you.

## 2018-04-05 NOTE — Telephone Encounter (Signed)
Will need to check a uric acid level and likely start allopurinol. Also if she is having multiple episodes then we can refer to rheumatology.

## 2018-04-05 NOTE — Telephone Encounter (Signed)
I told pt, since she is continuing to have flare-up gout she should come in to be reevaluated, prior to possible change of medication, and transferred to schedulers. I sent message to Dr. Jacqualyn Posey, for orders.

## 2018-04-06 NOTE — Telephone Encounter (Signed)
I explained to pt Dr. Leigh [place] recommendation. Pt states why does she have to come in or have the labs if her symptoms are going away. I told pt Dr. Jacqualyn Posey could not continue to order colchicine or change medications for multiple episode of gout. Pt states what is the purpose of being seen or getting labs if the symptoms are going away. I told her Dr. Jacqualyn Posey could go over the disease process and determine the cause during his evaluation and with the multiple episodes she may need to be evaluated by a rheumatologist. Pt states what good is it if she has a flare and can't get the medication until it is almost over. I suggested to pt, we would not refill the colchicine at this time, but if she did have an episode over the weekend she could call the after-hours doctor for colchicine then make an appt to be seen on the following week.

## 2018-04-18 DIAGNOSIS — H00023 Hordeolum internum right eye, unspecified eyelid: Secondary | ICD-10-CM | POA: Diagnosis not present

## 2018-04-28 DIAGNOSIS — J321 Chronic frontal sinusitis: Secondary | ICD-10-CM | POA: Diagnosis not present

## 2018-04-28 DIAGNOSIS — R51 Headache: Secondary | ICD-10-CM | POA: Diagnosis not present

## 2018-04-28 DIAGNOSIS — J322 Chronic ethmoidal sinusitis: Secondary | ICD-10-CM | POA: Diagnosis not present

## 2018-04-28 DIAGNOSIS — J3489 Other specified disorders of nose and nasal sinuses: Secondary | ICD-10-CM | POA: Diagnosis not present

## 2018-05-07 ENCOUNTER — Other Ambulatory Visit: Payer: Self-pay | Admitting: Otolaryngology

## 2018-05-19 ENCOUNTER — Encounter (HOSPITAL_COMMUNITY)
Admission: RE | Admit: 2018-05-19 | Discharge: 2018-05-19 | Disposition: A | Discharge status: Home / Self Care | Payer: 59 | Source: Ambulatory Visit | Attending: Otolaryngology | Admitting: Otolaryngology

## 2018-05-19 ENCOUNTER — Encounter (HOSPITAL_COMMUNITY): Payer: Self-pay

## 2018-05-19 ENCOUNTER — Other Ambulatory Visit: Payer: Self-pay

## 2018-05-19 DIAGNOSIS — Z01818 Encounter for other preprocedural examination: Secondary | ICD-10-CM | POA: Diagnosis not present

## 2018-05-19 HISTORY — DX: Headache: R51

## 2018-05-19 HISTORY — DX: Prediabetes: R73.03

## 2018-05-19 LAB — CBC
HCT: 45.9 % (ref 36.0–46.0)
Hemoglobin: 15.1 g/dL — ABNORMAL HIGH (ref 12.0–15.0)
MCH: 30.4 pg (ref 26.0–34.0)
MCHC: 32.9 g/dL (ref 30.0–36.0)
MCV: 92.4 fL (ref 78.0–100.0)
PLATELETS: 252 10*3/uL (ref 150–400)
RBC: 4.97 MIL/uL (ref 3.87–5.11)
RDW: 13 % (ref 11.5–15.5)
WBC: 7.1 10*3/uL (ref 4.0–10.5)

## 2018-05-19 LAB — BASIC METABOLIC PANEL
Anion gap: 12 (ref 5–15)
BUN: 18 mg/dL (ref 6–20)
CALCIUM: 9.3 mg/dL (ref 8.9–10.3)
CO2: 26 mmol/L (ref 22–32)
CREATININE: 0.72 mg/dL (ref 0.44–1.00)
Chloride: 105 mmol/L (ref 98–111)
GFR calc Af Amer: >60 mL/min (ref >60)
Glucose, Bld: 104 mg/dL — ABNORMAL HIGH (ref 70–99)
Potassium: 5.1 mmol/L (ref 3.5–5.1)
SODIUM: 143 mmol/L (ref 135–145)

## 2018-05-19 NOTE — Pre-Procedure Instructions (Addendum)
SHAARON GOLLIDAY  05/19/2018      [id number] [address], [place] [address] [place] [address] Phone: [phone] Fax: [phone]    Your procedure is scheduled on Wednesday, Sept. 18th   Report to [facility] Admitting at 9:00 AM             (posted surgery time 11a - 12:41p)   Call this number if you have problems the morning of surgery:  [phone]   Remember:   Do not eat any foods or drink any liquids after midnight, Tuesday.   7 days prior to surgery, STOP TAKING ANY Vitamins, Herbal Supplements, Anti-inflammatories.     Take these medicines the morning of surgery with A SIP OF WATER : NONE    Do not wear jewelry, make-up or nail polish.  Do not wear lotions, powders,  perfumes, or deodorant.  Do not shave 48 hours prior to surgery.    Do not bring valuables to the hospital.  [facility] is not responsible for any belongings or valuables.  Contacts, dentures or bridgework may not be worn into surgery.  Leave your suitcase in the car.  After surgery it may be brought to your room.  For patients admitted to the hospital, discharge time will be determined by your treatment team.  Patients discharged the day of surgery will not be allowed to drive home, and will need someone to stay with you for the first 24 hrs.  Please read over the following fact sheets that you were given. Surgical Site Infection Prevention     [facility]- Preparing For Surgery  Before surgery, you can play an important role. Because skin is not sterile, your skin needs to be as free of germs as possible. You can reduce the number of germs on your skin by washing with CHG (chlorahexidine gluconate) Soap before surgery.  CHG is an antiseptic cleaner which kills germs and bonds with the skin to continue killing germs even after washing.    Oral Hygiene is also important to reduce your risk of  infection.    Remember - BRUSH YOUR TEETH THE MORNING OF SURGERY WITH YOUR REGULAR TOOTHPASTE  Please do not use if you have an allergy to CHG or antibacterial soaps. If your skin becomes reddened/irritated stop using the CHG.  Do not shave (including legs and underarms) for at least 48 hours prior to first CHG shower. It is OK to shave your face.  Please follow these instructions carefully.   1. Shower the NIGHT BEFORE SURGERY and the MORNING OF SURGERY with CHG.   2. If you chose to wash your hair, wash your hair first as usual with your normal shampoo.  3. After you shampoo, rinse your hair and body thoroughly to remove the shampoo.  4. Use CHG as you would any other liquid soap. You can apply CHG directly to the skin and wash gently with a scrungie or a clean washcloth.   5. Apply the CHG Soap to your body ONLY FROM THE NECK DOWN.  Do not use on open wounds or open sores. Avoid contact with your eyes, ears, mouth and genitals (private parts). Wash Face and genitals (private parts)  with your normal soap.  6. Wash thoroughly, paying special attention to the area where your surgery will be performed.  7. Thoroughly rinse your body with warm water from the neck down.  8. DO NOT shower/wash with your normal soap after using and rinsing off the CHG Soap.  9. Pat yourself dry with a CLEAN TOWEL.  10. Wear CLEAN PAJAMAS to bed the night before surgery, wear comfortable clothes the morning of surgery  11. Place CLEAN SHEETS on your bed the night of your first shower and DO NOT SLEEP WITH PETS.  Day of Surgery:  Do not apply any deodorants/lotions.  Please wear clean clothes to the hospital/surgery center.    Remember to brush your teeth WITH YOUR REGULAR TOOTHPASTE.

## 2018-05-26 ENCOUNTER — Ambulatory Visit (HOSPITAL_COMMUNITY)
Admission: RE | Admit: 2018-05-26 | Discharge: 2018-05-26 | Disposition: A | Discharge status: Home / Self Care | Payer: 59 | Source: Ambulatory Visit | Attending: Otolaryngology | Admitting: Otolaryngology

## 2018-05-26 ENCOUNTER — Ambulatory Visit (HOSPITAL_COMMUNITY): Payer: 59 | Admitting: Critical Care Medicine

## 2018-05-26 ENCOUNTER — Encounter (HOSPITAL_COMMUNITY): Payer: Self-pay | Admitting: General Practice

## 2018-05-26 ENCOUNTER — Encounter (HOSPITAL_COMMUNITY)
Admission: RE | Disposition: A | Discharge status: Home / Self Care | Payer: Self-pay | Source: Ambulatory Visit | Attending: Otolaryngology

## 2018-05-26 ENCOUNTER — Other Ambulatory Visit: Payer: Self-pay

## 2018-05-26 DIAGNOSIS — J329 Chronic sinusitis, unspecified: Secondary | ICD-10-CM | POA: Diagnosis not present

## 2018-05-26 DIAGNOSIS — J330 Polyp of nasal cavity: Secondary | ICD-10-CM | POA: Diagnosis not present

## 2018-05-26 DIAGNOSIS — G4730 Sleep apnea, unspecified: Secondary | ICD-10-CM | POA: Diagnosis not present

## 2018-05-26 DIAGNOSIS — Z6841 Body Mass Index (BMI) 40.0 and over, adult: Secondary | ICD-10-CM | POA: Insufficient documentation

## 2018-05-26 DIAGNOSIS — Z79899 Other long term (current) drug therapy: Secondary | ICD-10-CM | POA: Diagnosis not present

## 2018-05-26 DIAGNOSIS — J343 Hypertrophy of nasal turbinates: Secondary | ICD-10-CM | POA: Insufficient documentation

## 2018-05-26 DIAGNOSIS — J322 Chronic ethmoidal sinusitis: Secondary | ICD-10-CM | POA: Diagnosis not present

## 2018-05-26 DIAGNOSIS — D140 Benign neoplasm of middle ear, nasal cavity and accessory sinuses: Secondary | ICD-10-CM | POA: Diagnosis not present

## 2018-05-26 DIAGNOSIS — I10 Essential (primary) hypertension: Secondary | ICD-10-CM | POA: Diagnosis not present

## 2018-05-26 DIAGNOSIS — J302 Other seasonal allergic rhinitis: Secondary | ICD-10-CM | POA: Insufficient documentation

## 2018-05-26 DIAGNOSIS — J321 Chronic frontal sinusitis: Secondary | ICD-10-CM | POA: Diagnosis not present

## 2018-05-26 DIAGNOSIS — J320 Chronic maxillary sinusitis: Secondary | ICD-10-CM | POA: Diagnosis not present

## 2018-05-26 HISTORY — PX: SINUS ENDO W/FUSION: SHX777

## 2018-05-26 HISTORY — PX: TURBINATE REDUCTION: SHX6157

## 2018-05-26 SURGERY — SINUS SURGERY, ENDOSCOPIC, USING COMPUTER-ASSISTED NAVIGATION
Anesthesia: General | Site: Nose | Laterality: Left

## 2018-05-26 MED ORDER — HYDROCODONE-ACETAMINOPHEN 5-325 MG PO TABS
1.0000 | ORAL_TABLET | Freq: Once | ORAL | Status: AC
  Administered 2018-05-26: 1 via ORAL

## 2018-05-26 MED ORDER — ARTIFICIAL TEARS OPHTHALMIC OINT
TOPICAL_OINTMENT | OPHTHALMIC | Status: AC
  Filled 2018-05-26: qty 3.5

## 2018-05-26 MED ORDER — DEXTROSE 5 % IV SOLN
3.0000 g | INTRAVENOUS | Status: AC
  Administered 2018-05-26: 3 g via INTRAVENOUS
  Filled 2018-05-26: qty 3

## 2018-05-26 MED ORDER — DEXAMETHASONE SODIUM PHOSPHATE 10 MG/ML IJ SOLN
10.0000 mg | Freq: Once | INTRAMUSCULAR | Status: AC
  Administered 2018-05-26: 6 mg via INTRAVENOUS
  Filled 2018-05-26: qty 1

## 2018-05-26 MED ORDER — SODIUM CHLORIDE 0.9 % IR SOLN
Status: DC | PRN
  Administered 2018-05-26: 1000 mL

## 2018-05-26 MED ORDER — SUGAMMADEX SODIUM 200 MG/2ML IV SOLN
INTRAVENOUS | Status: DC | PRN
  Administered 2018-05-26: 250 mg via INTRAVENOUS

## 2018-05-26 MED ORDER — CHLORHEXIDINE GLUCONATE CLOTH 2 % EX PADS: 6.0000 | MEDICATED_PAD | Freq: Once | CUTANEOUS | Status: DC

## 2018-05-26 MED ORDER — LIDOCAINE 2% (20 MG/ML) 5 ML SYRINGE
INTRAMUSCULAR | Status: AC
  Filled 2018-05-26: qty 10

## 2018-05-26 MED ORDER — FENTANYL CITRATE (PF) 250 MCG/5ML IJ SOLN
INTRAMUSCULAR | Status: AC
  Filled 2018-05-26: qty 5

## 2018-05-26 MED ORDER — ONDANSETRON HCL 4 MG/2ML IJ SOLN
INTRAMUSCULAR | Status: DC | PRN
  Administered 2018-05-26: 4 mg via INTRAVENOUS

## 2018-05-26 MED ORDER — MIDAZOLAM HCL 5 MG/5ML IJ SOLN
INTRAMUSCULAR | Status: DC | PRN
  Administered 2018-05-26: 2 mg via INTRAVENOUS

## 2018-05-26 MED ORDER — FENTANYL CITRATE (PF) 250 MCG/5ML IJ SOLN
INTRAMUSCULAR | Status: DC | PRN
  Administered 2018-05-26 (×4): 50 ug via INTRAVENOUS

## 2018-05-26 MED ORDER — OXYMETAZOLINE HCL 0.05 % NA SOLN
NASAL | Status: DC | PRN
  Administered 2018-05-26: 2
  Administered 2018-05-26: 1

## 2018-05-26 MED ORDER — DEXAMETHASONE SODIUM PHOSPHATE 10 MG/ML IJ SOLN
INTRAMUSCULAR | Status: AC
  Filled 2018-05-26: qty 1

## 2018-05-26 MED ORDER — MIDAZOLAM HCL 2 MG/2ML IJ SOLN
INTRAMUSCULAR | Status: AC
  Filled 2018-05-26: qty 2

## 2018-05-26 MED ORDER — SODIUM CHLORIDE 0.9 % IV SOLN
INTRAVENOUS | Status: DC | PRN
  Administered 2018-05-26: 20 ug/min via INTRAVENOUS

## 2018-05-26 MED ORDER — ONDANSETRON HCL 4 MG/2ML IJ SOLN
INTRAMUSCULAR | Status: AC
  Filled 2018-05-26: qty 2

## 2018-05-26 MED ORDER — PROPOFOL 10 MG/ML IV BOLUS
INTRAVENOUS | Status: AC
  Filled 2018-05-26: qty 20

## 2018-05-26 MED ORDER — MUPIROCIN CALCIUM 2 % EX CREA
TOPICAL_CREAM | CUTANEOUS | Status: DC | PRN
  Administered 2018-05-26: 1 via TOPICAL

## 2018-05-26 MED ORDER — LIDOCAINE 2% (20 MG/ML) 5 ML SYRINGE
INTRAMUSCULAR | Status: DC | PRN
  Administered 2018-05-26: 80 mg via INTRAVENOUS

## 2018-05-26 MED ORDER — MUPIROCIN CALCIUM 2 % EX CREA
TOPICAL_CREAM | CUTANEOUS | Status: AC
  Filled 2018-05-26: qty 15

## 2018-05-26 MED ORDER — MUPIROCIN 2 % EX OINT
TOPICAL_OINTMENT | CUTANEOUS | Status: AC
  Filled 2018-05-26: qty 22

## 2018-05-26 MED ORDER — PHENYLEPHRINE 40 MCG/ML (10ML) SYRINGE FOR IV PUSH (FOR BLOOD PRESSURE SUPPORT)
PREFILLED_SYRINGE | INTRAVENOUS | Status: AC
  Filled 2018-05-26: qty 10

## 2018-05-26 MED ORDER — ROCURONIUM BROMIDE 50 MG/5ML IV SOSY
PREFILLED_SYRINGE | INTRAVENOUS | Status: AC
  Filled 2018-05-26: qty 10

## 2018-05-26 MED ORDER — CEPHALEXIN 500 MG PO CAPS: 500.0000 mg | ORAL_CAPSULE | Freq: Three times a day (TID) | ORAL | 1 refills | Status: DC

## 2018-05-26 MED ORDER — HYDROCODONE-ACETAMINOPHEN 5-325 MG PO TABS: 1.0000 | ORAL_TABLET | Freq: Four times a day (QID) | ORAL | 0 refills | Status: AC | PRN

## 2018-05-26 MED ORDER — TRIAMCINOLONE ACETONIDE 40 MG/ML IJ SUSP
INTRAMUSCULAR | Status: DC | PRN
  Administered 2018-05-26: 40 mg

## 2018-05-26 MED ORDER — ONDANSETRON HCL 4 MG/2ML IJ SOLN: 4.0000 mg | Freq: Once | INTRAMUSCULAR | Status: DC | PRN

## 2018-05-26 MED ORDER — LIDOCAINE-EPINEPHRINE 1 %-1:100000 IJ SOLN
INTRAMUSCULAR | Status: DC | PRN
  Administered 2018-05-26: 5 mL

## 2018-05-26 MED ORDER — HYDROCODONE-ACETAMINOPHEN 5-325 MG PO TABS
ORAL_TABLET | ORAL | Status: AC
  Administered 2018-05-26: 1 via ORAL
  Filled 2018-05-26: qty 1

## 2018-05-26 MED ORDER — ROCURONIUM BROMIDE 10 MG/ML (PF) SYRINGE
PREFILLED_SYRINGE | INTRAVENOUS | Status: DC | PRN
  Administered 2018-05-26: 50 mg via INTRAVENOUS

## 2018-05-26 MED ORDER — FENTANYL CITRATE (PF) 100 MCG/2ML IJ SOLN: 25.0000 ug | INTRAMUSCULAR | Status: DC | PRN

## 2018-05-26 MED ORDER — TRIAMCINOLONE ACETONIDE 40 MG/ML IJ SUSP
INTRAMUSCULAR | Status: AC
  Filled 2018-05-26: qty 5

## 2018-05-26 MED ORDER — 0.9 % SODIUM CHLORIDE (POUR BTL) OPTIME
TOPICAL | Status: DC | PRN
  Administered 2018-05-26: 1000 mL

## 2018-05-26 MED ORDER — OXYMETAZOLINE HCL 0.05 % NA SOLN
NASAL | Status: AC
  Filled 2018-05-26: qty 15

## 2018-05-26 MED ORDER — SUCCINYLCHOLINE CHLORIDE 200 MG/10ML IV SOSY
PREFILLED_SYRINGE | INTRAVENOUS | Status: AC
  Filled 2018-05-26: qty 10

## 2018-05-26 MED ORDER — LIDOCAINE-EPINEPHRINE 1 %-1:100000 IJ SOLN
INTRAMUSCULAR | Status: AC
  Filled 2018-05-26: qty 1

## 2018-05-26 MED ORDER — LACTATED RINGERS IV SOLN
INTRAVENOUS | Status: DC
  Administered 2018-05-26: 10:00:00 via INTRAVENOUS

## 2018-05-26 MED ORDER — PROPOFOL 10 MG/ML IV BOLUS
INTRAVENOUS | Status: DC | PRN
  Administered 2018-05-26: 200 mg via INTRAVENOUS

## 2018-05-26 SURGICAL SUPPLY — 48 items
ATTRACTOMAT 16X20 MAGNETIC DRP (DRAPES) IMPLANT
BLADE ROTATE RAD 40 4 M4 (BLADE) ×3 IMPLANT
BLADE ROTATE TRICUT 4X13 M4 (BLADE) ×3 IMPLANT
BLADE SURG 15 STRL LF DISP TIS (BLADE) IMPLANT
BLADE SURG 15 STRL SS (BLADE)
CANISTER SUCT 3000ML PPV (MISCELLANEOUS) ×6 IMPLANT
COAGULATOR SUCT 8FR VV (MISCELLANEOUS) IMPLANT
COAGULATOR SUCT SWTCH 10FR 6 (ELECTROSURGICAL) IMPLANT
DRAPE HALF SHEET 40X57 (DRAPES) ×2 IMPLANT
DRESSING NASAL KENNEDY 3.5X.9 (MISCELLANEOUS) IMPLANT
DRSG NASAL KENNEDY 3.5X.9 (MISCELLANEOUS)
ELECT COATED BLADE 2.86 ST (ELECTRODE) ×2 IMPLANT
ELECT REM PT RETURN 9FT ADLT (ELECTROSURGICAL) ×3
ELECTRODE REM PT RTRN 9FT ADLT (ELECTROSURGICAL) ×2 IMPLANT
FILTER ARTHROSCOPY CONVERTOR (FILTER) IMPLANT
FLUID NSS /IRRIG 1000 ML XXX (MISCELLANEOUS) ×3 IMPLANT
GAUZE SPONGE 2X2 8PLY STRL LF (GAUZE/BANDAGES/DRESSINGS) ×2 IMPLANT
GLOVE BIOGEL M 7.0 STRL (GLOVE) ×6 IMPLANT
GOWN STRL REUS W/ TWL LRG LVL3 (GOWN DISPOSABLE) ×2 IMPLANT
GOWN STRL REUS W/TWL LRG LVL3 (GOWN DISPOSABLE) ×9
KIT BASIN OR (CUSTOM PROCEDURE TRAY) ×3 IMPLANT
KIT TURNOVER KIT B (KITS) ×3 IMPLANT
NDL 18GX1X1/2 (RX/OR ONLY) (NEEDLE) IMPLANT
NDL HYPO 25GX1X1/2 BEV (NEEDLE) ×2 IMPLANT
NEEDLE 18GX1X1/2 (RX/OR ONLY) (NEEDLE) IMPLANT
NEEDLE HYPO 25GX1X1/2 BEV (NEEDLE) ×3 IMPLANT
NS IRRIG 1000ML POUR BTL (IV SOLUTION) ×3 IMPLANT
PAD ARMBOARD 7.5X6 YLW CONV (MISCELLANEOUS) ×6 IMPLANT
PENCIL BUTTON HOLSTER BLD 10FT (ELECTRODE) IMPLANT
SPECIMEN JAR SMALL (MISCELLANEOUS) IMPLANT
SPLINT NASAL DOYLE BI-VL (GAUZE/BANDAGES/DRESSINGS) IMPLANT
SPONGE GAUZE 2X2 STER 10/PKG (GAUZE/BANDAGES/DRESSINGS) ×1
SPONGE NEURO XRAY DETECT 1X3 (DISPOSABLE) ×3 IMPLANT
SUT ETHILON 3 0 FSL (SUTURE) IMPLANT
SUT ETHILON 3 0 PS 1 (SUTURE) IMPLANT
SUT PLAIN 4 0 ~~LOC~~ 1 (SUTURE) IMPLANT
SWAB COLLECTION DEVICE MRSA (MISCELLANEOUS) IMPLANT
SWAB CULTURE ESWAB REG 1ML (MISCELLANEOUS) IMPLANT
SYR CONTROL 10ML LL (SYRINGE) IMPLANT
TOWEL OR 17X24 6PK STRL BLUE (TOWEL DISPOSABLE) ×3 IMPLANT
TRACKER ENT INSTRUMENT (MISCELLANEOUS) ×3 IMPLANT
TRACKER ENT PATIENT (MISCELLANEOUS) ×3 IMPLANT
TRAY ENT MC OR (CUSTOM PROCEDURE TRAY) ×3 IMPLANT
TUBE CONNECTING 12X1/4 (SUCTIONS) ×3 IMPLANT
TUBE SALEM SUMP 16 FR W/ARV (TUBING) ×3 IMPLANT
TUBING EXTENTION W/L.L. (IV SETS) ×3 IMPLANT
TUBING STRAIGHTSHOT EPS 5PK (TUBING) ×3 IMPLANT
WIPE INSTRUMENT VISIWIPE 73X73 (MISCELLANEOUS) ×3 IMPLANT

## 2018-05-26 NOTE — Anesthesia Postprocedure Evaluation (Signed)
Anesthesia Post Note  Patient: Gabrielle Moore  Procedure(s) Performed: ENDOSCOPIC SINUS SURGERY WITH NAVIGATION (Left Nose) TURBINATE REDUCTION (Bilateral Nose)     Patient location during evaluation: PACU Anesthesia Type: General Level of consciousness: awake and alert Pain management: pain level controlled Vital Signs Assessment: post-procedure vital signs reviewed and stable Respiratory status: spontaneous breathing, nonlabored ventilation, respiratory function stable and patient connected to nasal cannula oxygen Cardiovascular status: blood pressure returned to baseline and stable Postop Assessment: no apparent nausea or vomiting Anesthetic complications: no    Last Vitals:  Vitals:   05/26/18 1310 05/26/18 1320  BP: (!) 151/72 138/76  Pulse:    Resp:    Temp:    SpO2: 93% 92%    Last Pain:  Vitals:   05/26/18 1310  PainSc: 4                  Tiajuana Amass

## 2018-05-26 NOTE — Anesthesia Preprocedure Evaluation (Signed)
Anesthesia Evaluation  Patient identified by MRN, date of birth, ID band Patient awake    Reviewed: Allergy & Precautions, NPO status , Patient's Chart, lab work & pertinent test results  Airway Mallampati: II  TM Distance: >3 FB Neck ROM: Full    Dental  (+) Dental Advisory Given   Pulmonary asthma , sleep apnea ,    breath sounds clear to auscultation       Cardiovascular hypertension, Pt. on medications  Rhythm:Regular Rate:Normal     Neuro/Psych negative neurological ROS     GI/Hepatic negative GI ROS, Neg liver ROS,   Endo/Other  Morbid obesity  Renal/GU negative Renal ROS     Musculoskeletal   Abdominal   Peds  Hematology negative hematology ROS (+)   Anesthesia Other Findings   Reproductive/Obstetrics                             Lab Results  Component Value Date   WBC 7.1 05/19/2018   HGB 15.1 (H) 05/19/2018   HCT 45.9 05/19/2018   MCV 92.4 05/19/2018   PLT 252 05/19/2018   Lab Results  Component Value Date   CREATININE 0.72 05/19/2018   BUN 18 05/19/2018   NA 143 05/19/2018   K 5.1 05/19/2018   CL 105 05/19/2018   CO2 26 05/19/2018    Anesthesia Physical Anesthesia Plan  ASA: III  Anesthesia Plan: General   Post-op Pain Management:    Induction: Intravenous  PONV Risk Score and Plan: 3 and Dexamethasone, Treatment may vary due to age or medical condition, Ondansetron and Midazolam  Airway Management Planned: Oral ETT  Additional Equipment:   Intra-op Plan:   Post-operative Plan: Extubation in OR  Informed Consent: I have reviewed the patients History and Physical, chart, labs and discussed the procedure including the risks, benefits and alternatives for the proposed anesthesia with the patient or authorized representative who has indicated his/her understanding and acceptance.   Dental advisory given  Plan Discussed with: CRNA  Anesthesia Plan  Comments:         Anesthesia Quick Evaluation

## 2018-05-26 NOTE — Transfer of Care (Signed)
Immediate Anesthesia Transfer of Care Note  Patient: Gabrielle Moore  Procedure(s) Performed: ENDOSCOPIC SINUS SURGERY WITH NAVIGATION (Left Nose) TURBINATE REDUCTION (Bilateral Nose)  Patient Location: PACU  Anesthesia Type:General  Level of Consciousness: awake, alert  and oriented  Airway & Oxygen Therapy: Patient Spontanous Breathing and Patient connected to face mask oxygen  Post-op Assessment: Report given to RN, Post -op Vital signs reviewed and stable and Patient moving all extremities X 4  Post vital signs: Reviewed and stable  Last Vitals:  Vitals Value Taken Time  BP 137/79 05/26/2018 11:29 AM  Temp    Pulse 63 05/26/2018 11:30 AM  Resp 36 05/26/2018 11:30 AM  SpO2 96 % 05/26/2018 11:30 AM  Vitals shown include unvalidated device data.  Last Pain:  Vitals:   05/26/18 0921  PainSc: 4       Patients Stated Pain Goal: 5 (43/27/61 4709)  Complications: No apparent anesthesia complications

## 2018-05-26 NOTE — H&P (Signed)
Gabrielle Moore is an 63 y.o. female.   Chief Complaint: Chronic sinusitis and nasal obstruction HPI: Patient with history of chronic sinus complaints, discharge and cough.  Posttreatment CT scan showed left chronic appearing sinonasal disease and bilateral inferior turbinate hypertrophy.  Past Medical History:  Diagnosis Date  . Asthma    as a child  . Headache   . Hypertension   . Pre-diabetes    "LABELED AS PRE" -- TAKES NO MEDS  . Seasonal allergies   . Sleep apnea     Past Surgical History:  Procedure Laterality Date  . BREAST SURGERY  1998   breast reduction  . CESAREAN SECTION  1996  . LAPAROSCOPIC GASTRIC BANDING  2009  . LAPAROSCOPIC GASTRIC BANDING  2009  . SHOULDER SURGERY    . TONSILLECTOMY AND ADENOIDECTOMY      Family History  Problem Relation Age of Onset  . Hyperlipidemia Mother   . Hypertension Mother   . Hyperlipidemia Father   . Hypertension Father   . Heart disease Father 49  . Heart attack Father   . Heart disease Unknown        parent    Social History:  reports that she has never smoked. She has never used smokeless tobacco. She reports that she does not drink alcohol or use drugs.  Allergies:  Allergies  Allergen Reactions  . Codeine Nausea Only    Medications Prior to Admission  Medication Sig Dispense Refill  . BC FAST PAIN RELIEF 650-195-33.3 MG PACK Take 1 packet by mouth daily as needed (pain/headache.).    Marland Kitchen loratadine (CLARITIN) 10 MG tablet Take 10 mg by mouth daily.    . valsartan-hydrochlorothiazide (DIOVAN-HCT) 80-12.5 MG tablet TAKE 1 TABLET BY MOUTH DAILY 90 tablet 3  . colchicine 0.6 MG tablet Take 1 tablet (0.6 mg total) by mouth daily. (Patient not taking: Reported on 05/13/2018) 10 tablet 0  . ondansetron (ZOFRAN) 4 MG tablet Take 1 tablet (4 mg total) by mouth every 8 (eight) hours as needed for nausea or vomiting. (Patient not taking: Reported on 05/13/2018) 10 tablet 0    No results found for this or any previous visit  (from the past 48 hour(s)). No results found.  Review of Systems  Constitutional: Negative.   HENT: Positive for congestion.   Respiratory: Negative.   Cardiovascular: Negative.     Blood pressure (!) 159/70, pulse 69, temperature 98.6 F (37 C), resp. rate 20, height 5\' 6"  (1.676 m), weight 118.2 kg, SpO2 94 %. Physical Exam  Constitutional: She appears well-developed and well-nourished.  HENT:  Chronic left nasal obstruction and bilateral turbinate hypertrophy.  Neck: Normal range of motion. Neck supple.  Cardiovascular: Normal rate.  Respiratory: Effort normal.  GI: Soft.     Assessment/Plan Patient admitted for left endoscopic sinus surgery and bilateral inferior turbinate reduction under general anesthesia as an outpatient.  Jeovani Weisenburger, MD 05/26/2018, 9:35 AM

## 2018-05-26 NOTE — Anesthesia Procedure Notes (Signed)
Procedure Name: Intubation Date/Time: 05/26/2018 10:01 AM Performed by: Wilburn Cornelia, CRNA Pre-anesthesia Checklist: Patient identified, Emergency Drugs available, Suction available, Patient being monitored and Timeout performed Patient Re-evaluated:Patient Re-evaluated prior to induction Oxygen Delivery Method: Circle system utilized Preoxygenation: Pre-oxygenation with 100% oxygen Induction Type: IV induction Ventilation: Mask ventilation without difficulty and Oral airway inserted - appropriate to patient size Laryngoscope Size: Mac and 3 Grade View: Grade III Tube type: Oral Tube size: 7.5 mm Number of attempts: 1 Airway Equipment and Method: Stylet Placement Confirmation: ETT inserted through vocal cords under direct vision,  positive ETCO2,  CO2 detector and breath sounds checked- equal and bilateral Secured at: 21 cm Tube secured with: Tape Dental Injury: Teeth and Oropharynx as per pre-operative assessment

## 2018-05-26 NOTE — Op Note (Signed)
Operative Note:  ENDOSCOPIC SINUS SURGERY WITH NAVIGATION    INFERIOR TURBINATE REDUCTION  Patient: Gabrielle Moore  Medical record number: [id number]  Date:05/26/2018  Pre-operative Indications: 1.  Chronic Left Sinusitis     2.  Bilateral inferior turbinate hypertrophy  Postoperative Indications: Same  Surgical Procedure: 1.  Left endoscopic sinus surgery with intraoperative computer-assisted navigation (Fusion) consisting of left total ethmoidectomy, left maxillary antrostomy with removal of diseased tissue and left nasal frontal recess exploration.    2.  Left endoscopic nasal polypectomy    3.  Bilateral Inferior Turbinate Reduction  Anesthesia: GET  Surgeon: Delsa Bern, M.D.  Complications: None  EBL: 150 cc  Findings: Extensive left sinonasal polyp disease with mucopurulent discharge in the ethmoid, maxillary and frontal sinuses and bilateral inferior turbinate hypertrophy.  No nasal packing placed.  Kenalog/Bactroban slurry was placed in the left frontal, ethmoid and maxillary sinuses at the conclusion of the surgical procedure.   Brief History: The patient is a 62 y.o. female with a history of chronic sinusitis and turbinate hypertrophy. The patient has been on medical therapy to reduce nasal mucosal edema and infection including antibiotics, saline nasal spray and topical nasal steroids. Despite appropriate medical therapy the patient continues to have ongoing symptoms. Given the patient's history and findings, the above surgical procedures were recommended, risks and benefits were discussed in detail with the patient may understand and agree with our plan for surgery which is scheduled at [facility] under general anesthesia as an outpatient.  Surgical Procedure: The patient is brought to the operating room on 05/26/2018 and placed in supine position on the operating table. General endotracheal anesthesia was established without difficulty. When the patient was  adequately anesthetized, surgical timeout was performed with correct identification of the patient and the surgical procedure. The patient's nose was then injected with 5 cc of 1% lidocaine 1:100,000 dilution epinephrine which was injected in a submucosal fashion. The patient's nose was then packed with Afrin-soaked cottonoid pledgets were left in place for approximately 10 minutes to allow for vasoconstriction and hemostasis.  The [employer] Fusion navigation headgear was applied in anatomic and surgical landmarks were identified and confirmed, navigation was used throughout the sinus component of the surgical procedure.  With the patient prepped draped and prepared for surgery, nasal nasal endoscopy was performed on the left side.  Using a 0 degree endoscope and a straight microdebrider, debridement of left nasal polypoid disease was undertaken.  Significant polyps filling the nasal cavity were resected with the microdebrider to the level of the middle meatus.  There was also a large soft tissue polyp in the posterior nasopharynx which was resected.  The middle turbinate was carefully medialized.  A total ethmoidectomy was performed dissecting from anterior to posterior along the floor of the ethmoid sinus removing bony septations and diseased mucosa.  Purulent material was cleared from the posterior ethmoid region.  Using a 45 degree telescope and a curved microdebrider dissection was then carried out along the roof of the ethmoid sinus with navigation, completing a total ethmoidectomy.  Attention was then turned to the nasal frontal recess and again using a curved microdebrider, navigation and endoscopic visualization the nasal frontal recess was widely open, underlying ethmoid cells and diseased mucosa resected creating a widely patent nasal frontal recess.  There was extensive polypoid disease in the frontal sinus which was completely cleared creating a widely patent sinus ostium.  Purulent disease in the  superior component of the frontal sinus was then irrigated  and suctioned.  The lateral nasal wall was inspected, uncinate process was resected using a through-cutting forcep and the natural ostium of the maxillary sinus was enlarged in a posterior and inferior direction.  Within the maxillary sinus thick mucoid material and polypoid soft tissue was resected.    Attention was then turned to the inferior turbinates, bilateral inferior turbinate intramural cautery was performed with cautery setting at 64 W.  2 submucosal passes were made in each inferior turbinate.  After completing cautery, anterior vertical incisions were created and overlying soft tissue was elevated, a small amount of turbinate bone was resected.  The turbinates were then outfractured to create a more patent nasal passageway.  At the conclusion of the procedure, a 50-50 mix of Kenalog 40 and Bactroban was instilled in the left sinuses.  No nasal packing was placed.  Surgical sponge count was correct. An oral gastric tube was passed and the stomach contents were aspirated. Patient was awakened from anesthetic and transferred from the operating room to the recovery room in stable condition. There were no complications and blood loss was 150 cc.   Delsa Bern, M.D. [facility] 05/26/2018

## 2018-05-27 ENCOUNTER — Encounter (HOSPITAL_COMMUNITY): Payer: Self-pay | Admitting: Otolaryngology

## 2018-05-28 ENCOUNTER — Encounter (HOSPITAL_COMMUNITY): Payer: Self-pay | Admitting: *Deleted

## 2018-05-28 ENCOUNTER — Emergency Department (HOSPITAL_COMMUNITY)
Admission: EM | Admit: 2018-05-28 | Discharge: 2018-05-28 | Disposition: A | Discharge status: Home / Self Care | Payer: 59 | Attending: Emergency Medicine | Admitting: Emergency Medicine

## 2018-05-28 ENCOUNTER — Emergency Department (HOSPITAL_COMMUNITY): Payer: 59

## 2018-05-28 DIAGNOSIS — I10 Essential (primary) hypertension: Secondary | ICD-10-CM | POA: Diagnosis not present

## 2018-05-28 DIAGNOSIS — Z79899 Other long term (current) drug therapy: Secondary | ICD-10-CM | POA: Diagnosis not present

## 2018-05-28 DIAGNOSIS — R0602 Shortness of breath: Secondary | ICD-10-CM | POA: Diagnosis not present

## 2018-05-28 DIAGNOSIS — J45909 Unspecified asthma, uncomplicated: Secondary | ICD-10-CM | POA: Diagnosis not present

## 2018-05-28 DIAGNOSIS — R05 Cough: Secondary | ICD-10-CM | POA: Diagnosis not present

## 2018-05-28 MED ORDER — ALBUTEROL SULFATE HFA 108 (90 BASE) MCG/ACT IN AERS
2.0000 | INHALATION_SPRAY | RESPIRATORY_TRACT | Status: DC | PRN
  Administered 2018-05-28: 2 via RESPIRATORY_TRACT
  Filled 2018-05-28: qty 6.7

## 2018-05-28 MED ORDER — AZITHROMYCIN 250 MG PO TABS: ORAL_TABLET | ORAL | 0 refills | Status: DC

## 2018-05-28 NOTE — ED Notes (Signed)
Pt's oxygen saturation fluctuated between 95% and 100% on room air while ambulating

## 2018-05-28 NOTE — ED Triage Notes (Signed)
Pt in c/o cough and wheezing with some shortness of breath, pt had a sinus cavity surgery on Wednesday and developed these symptoms yesterday, unsure if she is having an allergic reaction to her antibiotic or developing a lung infection, no distress noted

## 2018-05-28 NOTE — ED Provider Notes (Signed)
[facility] EMERGENCY DEPARTMENT Provider Note   CSN: [id number] Arrival date & time: 05/28/18  0944     History   Chief Complaint Chief Complaint  Patient presents with  . Cough    HPI CAREEN MAUCH is a 62 y.o. female.  The history is provided by the patient. No language interpreter was used.     62 year old female with history of hypertension, obstructive sleep apnea on CPAP, obesity presenting complaining of shortness of breath.  Patient reports 2 days ago she had sinus surgery under generalized anesthesia for several hours.  She was ultimately discharged and was prescribed Keflex.  She has been taking the medication but yesterday she developed shortness of breath, and flushing sensation throughout skin.  She also noticed that she was wheezing.  Shortness of breath worsening with walking.  She endorsed occasional nonproductive cough.  She felt flushed but denies having fever chills.  No headache, pleuritic chest pain abdominal pain or back pain.  She does not have any known drug allergy except for codeine.  She denies any prior history of PE or DVT.  She does use a CPAP at night to sleep.  She is able to breathe her nose.  She denies tongue swelling abdominal cramping or lightheadedness.  Past Medical History:  Diagnosis Date  . Asthma    as a child  . Headache   . Hypertension   . Pre-diabetes    "LABELED AS PRE" -- TAKES NO MEDS  . Seasonal allergies   . Sleep apnea     Patient Active Problem List   Diagnosis Date Noted  . Sinusitis, chronic 05/26/2018  . OSA on CPAP 06/16/2016  . Snoring 05/02/2013  . Morbid obesity ([place]) 07/19/2012  . Hypertension 07/19/2012  . History of laparoscopic adjustable gastric banding, APS, 01/25/2008. 11/14/2011    Past Surgical History:  Procedure Laterality Date  . BREAST SURGERY  1998   breast reduction  . CESAREAN SECTION  1996  . LAPAROSCOPIC GASTRIC BANDING  2009  . LAPAROSCOPIC GASTRIC BANDING  2009    . SHOULDER SURGERY    . SINUS ENDO W/FUSION Left 05/26/2018   Procedure: ENDOSCOPIC SINUS SURGERY WITH NAVIGATION;  Surgeon: Jerrell Belfast, MD;  Location: [facility];  Service: ENT;  Laterality: Left;  . TONSILLECTOMY AND ADENOIDECTOMY    . TURBINATE REDUCTION Bilateral 05/26/2018   Procedure: TURBINATE REDUCTION;  Surgeon: Jerrell Belfast, MD;  Location: [facility];  Service: ENT;  Laterality: Bilateral;     OB History   None      Home Medications    Prior to Admission medications   Medication Sig Start Date End Date Taking? Authorizing Provider  BC FAST PAIN RELIEF [phone] MG PACK Take 1 packet by mouth daily as needed (pain/headache.).    [provider]  cephALEXin (KEFLEX) 500 MG capsule Take 1 capsule (500 mg total) by mouth 3 (three) times daily. 05/26/18   Jerrell Belfast, MD  colchicine 0.6 MG tablet Take 1 tablet (0.6 mg total) by mouth daily. Patient not taking: Reported on 05/13/2018 03/15/18   Trula Slade, DPM  HYDROcodone-acetaminophen (NORCO) 5-325 MG tablet Take 1-2 tablets by mouth every 6 (six) hours as needed for up to 5 days for moderate pain. 05/26/18 05/31/18  Jerrell Belfast, MD  loratadine (CLARITIN) 10 MG tablet Take 10 mg by mouth daily.    [provider]  ondansetron (ZOFRAN) 4 MG tablet Take 1 tablet (4 mg total) by mouth every 8 (eight) hours as  needed for nausea or vomiting. Patient not taking: Reported on 05/13/2018 03/16/18   Billie Ruddy, MD  valsartan-hydrochlorothiazide (DIOVAN-HCT) 80-12.5 MG tablet TAKE 1 TABLET BY MOUTH DAILY 10/09/17   Lucretia Kern, DO    Family History Family History  Problem Relation Age of Onset  . Hyperlipidemia Mother   . Hypertension Mother   . Hyperlipidemia Father   . Hypertension Father   . Heart disease Father 54  . Heart attack Father   . Heart disease Unknown        parent     Social History Social History   Tobacco Use  . Smoking status: Never Smoker  . Smokeless tobacco: Never Used   Substance Use Topics  . Alcohol use: No  . Drug use: No     Allergies   Codeine   Review of Systems Review of Systems  All other systems reviewed and are negative.    Physical Exam Updated Vital Signs BP (!) 149/72 (BP Location: Right Arm)   Pulse 66   Temp (!) 97.2 F (36.2 C) (Oral)   Resp 20   SpO2 100%   Physical Exam  Constitutional: She appears well-developed and well-nourished. No distress.  Obese female well-appearing in no acute distress.  HENT:  Head: Atraumatic.  Nose: Nose normal.  Mouth/Throat: Oropharynx is clear and moist.  No tongue swelling.  Eyes: Conjunctivae are normal.  Neck: Neck supple.  Cardiovascular: Normal rate and regular rhythm.  Pulmonary/Chest: Effort normal and breath sounds normal.  Abdominal: Soft. Bowel sounds are normal. She exhibits no distension. There is no tenderness.  Musculoskeletal: She exhibits no edema.  Neurological: She is alert.  Skin: No rash noted.  No urticarial hives  Psychiatric: She has a normal mood and affect.  Nursing note and vitals reviewed.    ED Treatments / Results  Labs (all labs ordered are listed, but only abnormal results are displayed) Labs Reviewed - No data to display  EKG None  Radiology Dg Chest 2 View  Result Date: 05/28/2018 CLINICAL DATA:  Cough, wheezing EXAM: CHEST - 2 VIEW COMPARISON:  01/07/2017 FINDINGS: Linear airspace opacities in both lower lobes could reflect atelectasis or infiltrates. No effusions. Heart is normal size. No acute bony abnormality. IMPRESSION: Bibasilar atelectasis or infiltrates. Electronically Signed   By: Rolm Baptise M.D.   On: 05/28/2018 10:20    Procedures Procedures (including critical care time)  Medications Ordered in ED Medications  albuterol (PROVENTIL HFA;VENTOLIN HFA) 108 (90 Base) MCG/ACT inhaler 2 puff (has no administration in time range)     Initial Impression / Assessment and Plan / ED Course  I have reviewed the triage vital  signs and the nursing notes.  Pertinent labs & imaging results that were available during my care of the patient were reviewed by me and considered in my medical decision making (see chart for details).     BP (!) 156/71 (BP Location: Left Arm)   Pulse (!) 59   Temp (!) 97.2 F (36.2 C) (Oral)   Resp 16   SpO2 98%    Final Clinical Impressions(s) / ED Diagnoses   Final diagnoses:  Shortness of breath    ED Discharge Orders         Ordered    azithromycin (ZITHROMAX Z-PAK) 250 MG tablet     05/28/18 1306         1:01 PM Patient complaining of shortness of breath mild wheezing for the past couple days.  She recently had  sinus surgery.  Her chest x-ray shows bilateral atelectasis or infiltrates.  She does complain of some skin flushing.  It is questionable if she is having an allergic reaction to Keflex that was recently given versus a potential pneumonia.  I recommend patient to discontinue Keflex and will start her on a different antibiotic.  She does not have any symptoms at this time concerning for anaphylactic reaction.  She ambulates well maintaining oxygenation at 95 to 100% on room air.  I also have low suspicion for PE causing her symptoms however she understands to return promptly if condition worsen as she may benefit from a chest CT angiogram to rule out PE given recent surgery.  Patient discharged home with albuterol inhaler to use as needed.   Domenic Moras, PA-C 05/28/18 1307    Jola Schmidt, MD 05/29/18 [phone]

## 2018-05-28 NOTE — Discharge Instructions (Addendum)
Please stop taking your Keflex.  Take Z-Pak instead for potential pneumonia.  Use albuterol inhaler 2 puffs every 4 hours as needed for shortness of breath.  Return if you notice your shortness of breath worsen or if you have any other concerns.

## 2018-06-14 ENCOUNTER — Encounter: Payer: Self-pay | Admitting: Neurology

## 2018-06-16 ENCOUNTER — Encounter: Payer: Self-pay | Admitting: Neurology

## 2018-06-16 ENCOUNTER — Ambulatory Visit: Payer: 59 | Admitting: Neurology

## 2018-06-16 VITALS — BP 138/79 | HR 73 | Ht 66.0 in | Wt 267.0 lb

## 2018-06-16 DIAGNOSIS — Z9884 Bariatric surgery status: Secondary | ICD-10-CM | POA: Diagnosis not present

## 2018-06-16 DIAGNOSIS — Z9989 Dependence on other enabling machines and devices: Secondary | ICD-10-CM | POA: Diagnosis not present

## 2018-06-16 DIAGNOSIS — G4733 Obstructive sleep apnea (adult) (pediatric): Secondary | ICD-10-CM

## 2018-06-16 NOTE — Progress Notes (Signed)
SLEEP MEDICINE CLINIC   Provider:  Larey Seat, M D  Referring Provider: Lucretia Kern, DO Primary Care Physician:  Lucretia Kern, DO  Chief Complaint  Patient presents with  . Follow-up    pt alone, rm 11. pt states the CPAP is working well. no concerns. DME [facility]    HPI: Interval history from 16 June 2018, I have the pleasure of seeing Gabrielle Moore today a 62 year old female patient who has been using CPAP for at least 2 years now.  She always used to have a compliance of 100% but recently underwent sinus surgery in mid September of this year.  Until the beginning of October she had cut the use of her CPAP to shorter hours, but has now resumed 100% compliance for the last 9 days.  Her compliance by days is 97% at bedtime 63% her average usage is 5 hours nocturnally, she is using an AutoSet between 6 and 12 cmH2O pressure with 3 cm EPR and a residual AHI of only 0.1/h.  This is an almost complete resolution of sleep apnea.  She does not have major air leaks the 95th percentile pressure is 10.6 cm water.  No central apneas are emerging.  The Epworth sleepiness score was endorsed at 5 out of 24 possible points fatigue severity at 33 out of 63 points in geriatric depression at 2 out of 15 points. Sinus surgery was necessary due to severe headaches, sinusitis and nasal airway obstruction.    RV 06-15-2017 for CPAP compliance. Yearly RV. Gabrielle Moore has used her machine 100% of the last 30 days was 93% compliance bedtime, average user time 5 hours and 49 minutes, CPAP is set at 9 cm water pressure with 3 cm EPR and her residual AHI of 1.5 per hour was achieved. No central apneas are emerging, the 95th percentile leak is 10.8. The patient feels comfortable with the use of her machine she is no longer daytime sleepy or fatigued, endorsed the Epworth score at 3 points and the fatigue severity at 17 points. No more power naps !     Gabrielle Moore is a 62 y.o. female , seen here  as a referral from Dr. Maudie Mercury for a sleep medicine consultation,   Gabrielle Moore had undergone a sleep study in 2008 or 2009, and was placed on CPAP. In a move 2014 she lost the CPAP and needs a replacement.  Her family has reported that she snores very loudly, her husband even found no relief in using earplugs. 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 She has not woken herself choking for air or gasping, but she does have nocturia. She feels tired and ready to go to bed after work. And she does fall asleep rather promptly and endorsed the daytime Epworth sleepiness score at 14 points. Recently she drove from [facility] to [place] but had to take a stop and the 50 minute power nap before she completed the journey.  Chief complaint according to patient : Recently when the patient had to overcome an upper airway respiratory infection she was forced to mouth breathe and was snoring even louder than usual, this caused the referral for Evaluation.  Sleep habits are as follows: She reports that after work she may visit her mother, and will stay at her home for about 5 hours. She feels exhausted as a Land. She will return to her own home by about 9 PM and  is in bed by 10 PM, promptly asleep. She endorsed one bathroom break at night. She will rises at 5 AM and relies on an alarm. She does not eat breakfast at home but may have a cup of coffee and usually watches the morning news on TV, at work she will have a pop tart or another quick breakfast. She does not take anymore caffeine during the day. She has a rather sedentary job office-based. She does take several water cooler breaks, and there is a window not far from her cubicle. She only commutes 10 minutes to work.  Sleep medical history and family sleep history: Mother has Alzheimer's.  Social history: married, one daughter 26, works for [employer].   06-16-2016, Mrs.  Moore underwent a home sleep test on 02/23/2016 which diagnosed her with mild to moderate apnea at an AHI of 14.4 but associated with hypoxemia for 64.6 minutes of desaturation time. The nadir of oxygen saturation was 77%. For this reason she was invited for CPAP titration, as a dental device would not address the hypoxemia component of her apnea. She was titrated on 04/04/2016 to 11 cm water. But did best at 9 cm water setting. She experienced rebounding REM sleep, and a very normal sleep architecture  Gabrielle Moore has brought her CPAP today and he were able by by 5 to investigate her compliance she has only used the machine over 4 hours nightly for 15 days of the last 30 days, on those nights she used to machine and her average user time is 6 hours and 9 minutes. She averaged all over only 3 hours and 41 minutes. There has been some social environmental changes from Gabrielle Moore that explain her fragmented sleep. She has a new puppy in the house. The residual AHI was only 1.8, there is an excellent resolution of apnea there is a mild to moderate air leak at 9 cm water pressure with 3 cm EPR. I like for Gabrielle Moore to continue using CPAP at the settings. She also seems to be no longer fatigued or sleepy her Epworth sleepiness score today was endorsed at 8 points   Review of Systems: Out of a complete 14 system review, the patient complains of only the following symptoms, and all other reviewed systems are negative. Full time employed.   Epworth score 5 from  8 from 14 , Fatigue severity score  33/ 63   , depression score  Negative.   Social History   Socioeconomic History  . Marital status: Married    Spouse name: Not on file  . Number of children: Not on file  . Years of education: Not on file  . Highest education level: Not on file  Occupational History  . Occupation: Advertising copywriter. Mgr    Employer: [employer]  Social Needs  . Financial resource strain: Not on file  .  Food insecurity:    Worry: Not on file    Inability: Not on file  . Transportation needs:    Medical: Not on file    Non-medical: Not on file  Tobacco Use  . Smoking status: Never Smoker  . Smokeless tobacco: Never Used  Substance and Sexual Activity  . Alcohol use: No  . Drug use: No  . Sexual activity: Not on file  Lifestyle  . Physical activity:    Days per week: Not on file    Minutes per session: Not on file  . Stress: Not on file  Relationships  .  Social connections:    Talks on phone: Not on file    Gets together: Not on file    Attends religious service: Not on file    Active member of club or organization: Not on file    Attends meetings of clubs or organizations: Not on file    Relationship status: Not on file  . Intimate partner violence:    Fear of current or ex partner: Not on file    Emotionally abused: Not on file    Physically abused: Not on file    Forced sexual activity: Not on file  Other Topics Concern  . Not on file  Social History Narrative  . Not on file    Family History  Problem Relation Age of Onset  . Hyperlipidemia Mother   . Hypertension Mother   . Hyperlipidemia Father   . Hypertension Father   . Heart disease Father 45  . Heart attack Father   . Heart disease Unknown        parent     Past Medical History:  Diagnosis Date  . Asthma    as a child  . Headache   . Hypertension   . Pre-diabetes    "LABELED AS PRE" -- TAKES NO MEDS  . Seasonal allergies   . Sleep apnea     Past Surgical History:  Procedure Laterality Date  . BREAST SURGERY  1998   breast reduction  . CESAREAN SECTION  1996  . LAPAROSCOPIC GASTRIC BANDING  2009  . LAPAROSCOPIC GASTRIC BANDING  2009  . SHOULDER SURGERY    . SINUS ENDO W/FUSION Left 05/26/2018   Procedure: ENDOSCOPIC SINUS SURGERY WITH NAVIGATION;  Surgeon: Jerrell Belfast, MD;  Location: [place];  Service: ENT;  Laterality: Left;  . TONSILLECTOMY AND ADENOIDECTOMY    . TURBINATE REDUCTION  Bilateral 05/26/2018   Procedure: TURBINATE REDUCTION;  Surgeon: Jerrell Belfast, MD;  Location: [place];  Service: ENT;  Laterality: Bilateral;    Current Outpatient Medications  Medication Sig Dispense Refill  . BC FAST PAIN RELIEF 650-195-33.3 MG PACK Take 1 packet by mouth daily as needed (pain/headache.).    Marland Kitchen loratadine (CLARITIN) 10 MG tablet Take 10 mg by mouth daily.    . Sodium Chloride-Xylitol (XLEAR SINUS CARE SPRAY) SOLN Place 4 sprays into the nose every hour as needed (through post-op until Oct, 3,2019).    . valsartan-hydrochlorothiazide (DIOVAN-HCT) 80-12.5 MG tablet TAKE 1 TABLET BY MOUTH DAILY 90 tablet 3   No current facility-administered medications for this visit.     Allergies as of 06/16/2018 - Review Complete 06/16/2018  Allergen Reaction Noted  . Codeine Nausea Only 11/12/2011  . Keflex [cephalexin]  06/16/2018    Vitals: BP 138/79   Pulse 73   Ht 5\' 6"  (1.676 m)   Wt 267 lb (121.1 kg)   BMI 43.09 kg/m  Last Weight:  Wt Readings from Last 1 Encounters:  06/16/18 267 lb (121.1 kg)   BMW:UXLK mass index is 43.09 kg/m.     Last Height:   Ht Readings from Last 1 Encounters:  06/16/18 5\' 6"  (1.676 m)    Physical exam:  General: The patient is awake, alert and appears not in acute distress. The patient is well groomed. Head: Normocephalic, atraumatic. Neck is supple. Mallampati 4, status post tonsilectomy.  neck circumference:.17 Nasal airflow Patent. [name] is seen.  Cardiovascular:  Regular rate and rhythm , without  murmurs or carotid bruit, and without distended neck veins. Respiratory: Lungs are  clear to auscultation. Skin:  Without evidence of edema, or rash Trunk: BMI is elevated  The patient's posture is erect.    Neurologic exam : The patient is awake and alert, oriented to place and time.   Memory subjective  described as intact. Attention span & concentration ability appears normal.  Speech is fluent,  without dysarthria, dysphonia  or aphasia.  Mood and affect are appropriate.  Cranial nerves: Pupils are equal and briskly reactive to light.  Visual fields by finger perimetry are intact.Hearing to finger rub intact.  Facial sensation intact to fine touch. Facial motor strength is symmetric and tongue and uvula move midline. Shoulder shrug was symmetrical.  Motor exam:  Normal tone, muscle bulk and symmetric strength in all extremities.  Stance is stable and normal.  Deep tendon reflexes: in the  upper and lower extremities are symmetric and intact. She has significant knee arthritis- crepitation.   The patient was advised of the nature of the diagnosed sleep disorder , the treatment options and risks for general a health and wellness arising from not treating the condition.  I spent more than 20 minutes of face to face time with the patient. Greater than 50% of time was spent in counseling and coordination of care. We have discussed the compliance issue and the  patient's questions.     Assessment:  After physical and neurologic examination, review of laboratory studies,  Personal review of imaging studies, reports of other /same  Imaging studies ,  Results of polysomnography/ neurophysiology testing and pre-existing records as far as provided in visit., my assessment is :     Plan:  Treatment plan and additional workup :  RV in 12 month with yearly compliance data.  Establish an exercise routine.  Low carb diet - patient is a lap- band patient , the lap band was deflated. Referral to medical weight management.    Asencion Partridge Adolphe Fortunato MD  06/16/2018   CC: Lucretia Kern, Do [address] [place], [place] [id number]

## 2018-06-21 ENCOUNTER — Ambulatory Visit (INDEPENDENT_AMBULATORY_CARE_PROVIDER_SITE_OTHER): Payer: 59 | Admitting: *Deleted

## 2018-06-21 DIAGNOSIS — Z23 Encounter for immunization: Secondary | ICD-10-CM | POA: Diagnosis not present

## 2018-06-27 DIAGNOSIS — R509 Fever, unspecified: Secondary | ICD-10-CM | POA: Diagnosis not present

## 2018-06-27 DIAGNOSIS — R05 Cough: Secondary | ICD-10-CM | POA: Diagnosis not present

## 2018-06-27 DIAGNOSIS — J029 Acute pharyngitis, unspecified: Secondary | ICD-10-CM | POA: Diagnosis not present

## 2018-07-02 DIAGNOSIS — R0989 Other specified symptoms and signs involving the circulatory and respiratory systems: Secondary | ICD-10-CM | POA: Diagnosis not present

## 2018-07-02 DIAGNOSIS — J40 Bronchitis, not specified as acute or chronic: Secondary | ICD-10-CM | POA: Diagnosis not present

## 2018-07-07 ENCOUNTER — Other Ambulatory Visit: Payer: Self-pay | Admitting: Family Medicine

## 2018-07-10 ENCOUNTER — Other Ambulatory Visit: Payer: Self-pay

## 2018-07-10 ENCOUNTER — Emergency Department (HOSPITAL_BASED_OUTPATIENT_CLINIC_OR_DEPARTMENT_OTHER)
Admission: EM | Admit: 2018-07-10 | Discharge: 2018-07-11 | Disposition: A | Discharge status: Home / Self Care | Payer: 59 | Attending: Emergency Medicine | Admitting: Emergency Medicine

## 2018-07-10 ENCOUNTER — Encounter (HOSPITAL_BASED_OUTPATIENT_CLINIC_OR_DEPARTMENT_OTHER): Payer: Self-pay | Admitting: *Deleted

## 2018-07-10 DIAGNOSIS — I10 Essential (primary) hypertension: Secondary | ICD-10-CM | POA: Diagnosis not present

## 2018-07-10 DIAGNOSIS — H9203 Otalgia, bilateral: Secondary | ICD-10-CM | POA: Diagnosis present

## 2018-07-10 DIAGNOSIS — Z79899 Other long term (current) drug therapy: Secondary | ICD-10-CM | POA: Diagnosis not present

## 2018-07-10 DIAGNOSIS — H6123 Impacted cerumen, bilateral: Secondary | ICD-10-CM | POA: Diagnosis not present

## 2018-07-10 NOTE — ED Triage Notes (Signed)
Pt reports right ear pain today. Used ear wax removal kit without relief, states pain is worse

## 2018-07-11 NOTE — Discharge Instructions (Signed)
You may alternate Tylenol 1000 mg every 6 hours as needed for pain and Ibuprofen 800 mg every 8 hours as needed for pain.  Please take Ibuprofen with food. ° °

## 2018-07-11 NOTE — ED Provider Notes (Signed)
TIME SEEN: 12:51 AM  CHIEF COMPLAINT: Bilateral ear pain worse on the left  HPI: Patient is a 62 year old female who presents to the emergency department with bilateral ear pain worse on the left side.  No dental pain.  No sore throat.  No fever.  States she thinks she has a buildup of wax.  ROS: See HPI Constitutional: no fever  Eyes: no drainage  ENT: no runny nose   Cardiovascular:  no chest pain  Resp: no SOB  GI: no vomiting GU: no dysuria Integumentary: no rash  Allergy: no hives  Musculoskeletal: no leg swelling  Neurological: no slurred speech ROS otherwise negative  PAST MEDICAL HISTORY/PAST SURGICAL HISTORY:  Past Medical History:  Diagnosis Date  . Asthma    as a child  . Headache   . Hypertension   . Pre-diabetes    "LABELED AS PRE" -- TAKES NO MEDS  . Seasonal allergies   . Sleep apnea     MEDICATIONS:  Prior to Admission medications   Medication Sig Start Date End Date Taking? Authorizing Provider  BC FAST PAIN RELIEF 650-195-33.3 MG PACK Take 1 packet by mouth daily as needed (pain/headache.).   Yes [provider]  loratadine (CLARITIN) 10 MG tablet Take 10 mg by mouth daily.   Yes [provider]  Sodium Chloride-Xylitol (XLEAR SINUS CARE SPRAY) SOLN Place 4 sprays into the nose every hour as needed (through post-op until Oct, 3,2019).   Yes [provider]  valsartan-hydrochlorothiazide (DIOVAN-HCT) 80-12.5 MG tablet TAKE 1 TABLET BY MOUTH DAILY 07/07/18  Yes Colin Benton R, DO  valsartan-hydrochlorothiazide (DIOVAN-HCT) 80-12.5 MG tablet TAKE 1 TABLET BY MOUTH DAILY 10/09/17   Lucretia Kern, DO    ALLERGIES:  Allergies  Allergen Reactions  . Codeine Nausea Only  . Keflex [Cephalexin]     ? Reaction     SOCIAL HISTORY:  Social History   Tobacco Use  . Smoking status: Never Smoker  . Smokeless tobacco: Never Used  Substance Use Topics  . Alcohol use: No    FAMILY HISTORY: Family History  Problem Relation Age of  Onset  . Hyperlipidemia Mother   . Hypertension Mother   . Hyperlipidemia Father   . Hypertension Father   . Heart disease Father 47  . Heart attack Father   . Heart disease Unknown        parent     EXAM: BP (!) 158/82 (BP Location: Left Arm)   Pulse 84   Temp 98.3 F (36.8 C) (Oral)   Resp 18   SpO2 100%  CONSTITUTIONAL: Alert and oriented and responds appropriately to questions. Well-appearing; well-nourished HEAD: Normocephalic EYES: Conjunctivae clear, pupils appear equal, EOMI ENT: normal nose; moist mucous membranes; No pharyngeal erythema or petechiae, no tonsillar hypertrophy or exudate, no uvular deviation, no unilateral swelling, no trismus or drooling, no muffled voice, normal phonation, no stridor, no active dental caries present, multiple fillings noted.  No drainable dental abscess noted, no Ludwig's angina, tongue sits flat in the bottom of the mouth, no angioedema, no facial erythema or warmth, no facial swelling; no pain with movement of the neck.  After irrigation of both ears, TMs are clear bilaterally without erythema, purulence, bulging, perforation, effusion.  No cerumen impaction or sign of foreign body in the external auditory canal. No inflammation, erythema or drainage from the external auditory canal. No signs of mastoiditis. No pain with manipulation of the pinna bilaterally. NECK: Supple, no meningismus, no nuchal rigidity, no LAD  CARD: RRR; S1 and S2 appreciated; no murmurs, no clicks, no rubs, no gallops RESP: Normal chest excursion without splinting or tachypnea; breath sounds clear and equal bilaterally; no wheezes, no rhonchi, no rales, no hypoxia or respiratory distress, speaking full sentences ABD/GI: Normal bowel sounds; non-distended; soft, non-tender, no rebound, no guarding, no peritoneal signs, no hepatosplenomegaly BACK:  The back appears normal and is non-tender to palpation, there is no CVA tenderness EXT: Normal ROM in all joints; non-tender  to palpation; no edema; normal capillary refill; no cyanosis, no calf tenderness or swelling    SKIN: Normal color for age and race; warm; no rash NEURO: Moves all extremities equally PSYCH: The patient's mood and manner are appropriate. Grooming and personal hygiene are appropriate.  MEDICAL DECISION MAKING: Patient here with bilateral cerumen impaction.  No sign of otitis media, otitis externa, mastoiditis, TM perforation on examination.  Reports feeling better.  No sign of pharyngitis, dental abscess, Ludwig's angina.  Recommended alternating Tylenol and Motrin.  Recommend follow-up with her PCP if pain continues.  I do not feel she needs antibiotics at this time.   At this time, I do not feel there is any life-threatening condition present. I have reviewed and discussed all results (EKG, imaging, lab, urine as appropriate) and exam findings with patient/family. I have reviewed nursing notes and appropriate previous records.  I feel the patient is safe to be discharged home without further emergent workup and can continue workup as an outpatient as needed. Discussed usual and customary return precautions. Patient/family verbalize understanding and are comfortable with this plan.  Outpatient follow-up has been provided if needed. All questions have been answered.     .Ear Cerumen Removal Date/Time: 07/11/2018 12:51 AM Performed by: Michela Pitcher, RN Authorized by: Ward, Delice Bison, DO   Consent:    Consent obtained:  Verbal   Consent given by:  Patient   Risks discussed:  Bleeding, dizziness, infection, incomplete removal, pain and TM perforation   Alternatives discussed:  No treatment Procedure details:    Location:  L ear and R ear   Procedure type: irrigation   Post-procedure details:    Inspection:  TM intact   Hearing quality:  Normal   Patient tolerance of procedure:  Tolerated well, no immediate complications      Ward, Delice Bison, DO 07/11/18 0302

## 2018-08-17 DIAGNOSIS — M545 Low back pain: Secondary | ICD-10-CM | POA: Diagnosis not present

## 2018-08-23 ENCOUNTER — Ambulatory Visit: Payer: 59 | Admitting: Family Medicine

## 2018-09-06 ENCOUNTER — Ambulatory Visit: Payer: 59 | Admitting: Family Medicine

## 2018-09-21 ENCOUNTER — Encounter (INDEPENDENT_AMBULATORY_CARE_PROVIDER_SITE_OTHER): Payer: Self-pay

## 2018-09-28 ENCOUNTER — Ambulatory Visit (INDEPENDENT_AMBULATORY_CARE_PROVIDER_SITE_OTHER): Payer: BLUE CROSS/BLUE SHIELD | Admitting: Bariatrics

## 2018-09-28 ENCOUNTER — Encounter (INDEPENDENT_AMBULATORY_CARE_PROVIDER_SITE_OTHER): Payer: Self-pay | Admitting: Bariatrics

## 2018-09-28 VITALS — BP 142/75 | HR 73 | Temp 97.7°F | Ht 66.0 in | Wt 266.0 lb

## 2018-09-28 DIAGNOSIS — E559 Vitamin D deficiency, unspecified: Secondary | ICD-10-CM

## 2018-09-28 DIAGNOSIS — Z1331 Encounter for screening for depression: Secondary | ICD-10-CM | POA: Diagnosis not present

## 2018-09-28 DIAGNOSIS — R5383 Other fatigue: Secondary | ICD-10-CM | POA: Diagnosis not present

## 2018-09-28 DIAGNOSIS — R7309 Other abnormal glucose: Secondary | ICD-10-CM

## 2018-09-28 DIAGNOSIS — R0602 Shortness of breath: Secondary | ICD-10-CM | POA: Diagnosis not present

## 2018-09-28 DIAGNOSIS — Z6841 Body Mass Index (BMI) 40.0 and over, adult: Secondary | ICD-10-CM

## 2018-09-28 DIAGNOSIS — I10 Essential (primary) hypertension: Secondary | ICD-10-CM | POA: Diagnosis not present

## 2018-09-28 DIAGNOSIS — Z0289 Encounter for other administrative examinations: Secondary | ICD-10-CM

## 2018-09-28 DIAGNOSIS — G4733 Obstructive sleep apnea (adult) (pediatric): Secondary | ICD-10-CM | POA: Diagnosis not present

## 2018-09-28 DIAGNOSIS — Z9989 Dependence on other enabling machines and devices: Secondary | ICD-10-CM

## 2018-09-28 DIAGNOSIS — Z9189 Other specified personal risk factors, not elsewhere classified: Secondary | ICD-10-CM

## 2018-09-28 DIAGNOSIS — Z9884 Bariatric surgery status: Secondary | ICD-10-CM

## 2018-09-29 LAB — TSH: TSH: 2.96 u[IU]/mL (ref 0.450–4.500)

## 2018-09-29 LAB — COMPREHENSIVE METABOLIC PANEL
ALBUMIN: 4.2 g/dL (ref 3.8–4.8)
ALT: 36 IU/L — AB (ref 0–32)
AST: 32 IU/L (ref 0–40)
Albumin/Globulin Ratio: 1.3 (ref 1.2–2.2)
Alkaline Phosphatase: 70 IU/L (ref 39–117)
BILIRUBIN TOTAL: 0.4 mg/dL (ref 0.0–1.2)
BUN / CREAT RATIO: 18 (ref 12–28)
BUN: 12 mg/dL (ref 8–27)
CALCIUM: 9.5 mg/dL (ref 8.7–10.3)
CO2: 20 mmol/L (ref 20–29)
CREATININE: 0.68 mg/dL (ref 0.57–1.00)
Chloride: 102 mmol/L (ref 96–106)
GFR, EST AFRICAN AMERICAN: 109 mL/min/{1.73_m2} (ref >59)
GFR, EST NON AFRICAN AMERICAN: 95 mL/min/{1.73_m2} (ref >59)
GLUCOSE: 102 mg/dL — AB (ref 65–99)
Globulin, Total: 3.2 g/dL (ref 1.5–4.5)
Potassium: 4.4 mmol/L (ref 3.5–5.2)
Sodium: 142 mmol/L (ref 134–144)
TOTAL PROTEIN: 7.4 g/dL (ref 6.0–8.5)

## 2018-09-29 LAB — T3: T3, Total: 145 ng/dL (ref 71–180)

## 2018-09-29 LAB — LIPID PANEL WITH LDL/HDL RATIO
CHOLESTEROL TOTAL: 171 mg/dL (ref 100–199)
HDL: 56 mg/dL (ref >39)
LDL CALC: 93 mg/dL (ref 0–99)
LDL/HDL RATIO: 1.7 ratio (ref 0.0–3.2)
Triglycerides: 108 mg/dL (ref 0–149)
VLDL CHOLESTEROL CAL: 22 mg/dL (ref 5–40)

## 2018-09-29 LAB — HEMOGLOBIN A1C
Est. average glucose Bld gHb Est-mCnc: 128 mg/dL
Hgb A1c MFr Bld: 6.1 % — ABNORMAL HIGH (ref 4.8–5.6)

## 2018-09-29 LAB — VITAMIN D 25 HYDROXY (VIT D DEFICIENCY, FRACTURES): Vit D, 25-Hydroxy: 12.4 ng/mL — ABNORMAL LOW (ref 30.0–100.0)

## 2018-09-29 LAB — T4, FREE: Free T4: 1.24 ng/dL (ref 0.82–1.77)

## 2018-09-29 LAB — INSULIN, RANDOM: INSULIN: 38.3 u[IU]/mL — ABNORMAL HIGH (ref 2.6–24.9)

## 2018-09-29 NOTE — Progress Notes (Signed)
Office: [phone]  /  Fax: [phone]   Dear Dr. Maudie Mercury,   Thank you for referring Gabrielle Moore to our clinic. The following note includes my evaluation and treatment recommendations.  HPI:   Chief Complaint: OBESITY    Gabrielle Moore has been referred by Gabrielle Kern, DO for consultation regarding her obesity and obesity related comorbidities.    Gabrielle Moore (MR# [id number]) is a 63 y.o. female who presents on 09/28/2018 for obesity evaluation and treatment. Current BMI is Body mass index is 42.93 kg/m.Marland Kitchen Gabrielle Moore has been struggling with her weight for many years and has been unsuccessful in either losing weight, maintaining weight loss, or reaching her healthy weight goal.     Gabrielle Moore attended our information session and states she is currently in the action stage of change and ready to dedicate time achieving and maintaining a healthier weight. Gabrielle Moore is interested in becoming our patient and working on intensive lifestyle modifications including (but not limited to) diet, exercise and weight loss.    Gabrielle Moore states her family eats meals together she thinks her family will eat healthier with  her she struggles with family and or coworkers weight loss sabotage her desired weight loss is 126 lbs she has been heavy most of  her life her heaviest weight ever was 271 lbs. 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 This has  worsened with weight gain and has not worsened recently. Gabrielle Moore admits to daytime somnolence and she admits to waking up still tired. Patient has a diagnosis of obstructive sleep apnea and she is wearing CPAP. Gabrielle Moore has a history of symptoms of daytime fatigue and hypertension. Patient generally gets 6 hours of sleep per night, and states they generally have  restful sleep. Snoring is present. Apneic episodes are not present with CPAP use. Epworth Sleepiness Score is 12  Dyspnea on exertion Gabrielle Moore notes increasing shortness of breath with certain activities (walking fast) and seems to be worsening over time with weight gain. She notes getting out of breath sooner with activity than she used to. This has not gotten worse recently. Gabrielle Moore denies orthopnea.  Hypertension Gabrielle Moore is a 63 y.o. female with hypertension. She is taking Diovan-HCT. Gabrielle Moore denies chest pain. She is working weight loss to help control her blood pressure with the goal of decreasing her risk of heart attack and stroke. Gabrielle Moore blood pressure is currently controlled.  OSA (obstructive sleep apnea) on CPAP Gabrielle Moore has a diagnosis of obstructive sleep apnea and she is on CPAP. She admits to fatigue. Snoring is present.  Elevated Glucose Gabrielle Moore has a history of some elevated blood glucose readings without a diagnosis of diabetes. She admits to polyphagia at times.  At risk for diabetes Gabrielle Moore is at higher than average risk for developing diabetes due to her obesity and elevated glucose. She currently denies polyuria or polydipsia.  Vitamin D deficiency Gabrielle Moore has  a diagnosis of vitamin D deficiency. She is not currently taking vit D and denies nausea, vomiting or muscle weakness.  Depression Screen Gabrielle Moore's Food and Mood (modified PHQ-9) score was  Depression screen PHQ 2/9 09/28/2018  Decreased Interest 3  Down, Depressed, Hopeless 2  PHQ - 2 Score 5  Altered sleeping 2  Tired, decreased energy 3  Change in  appetite 3  Feeling bad or failure about yourself  3  Trouble concentrating 2  Moving slowly or fidgety/restless 1  Suicidal thoughts 0  PHQ-9 Score 19  Difficult doing work/chores Not difficult at all    ASSESSMENT AND PLAN:  Other fatigue - Plan: EKG 12-Lead, T3, T4, free, TSH  Shortness of breath on exertion - Plan: T3, T4, free, TSH  Essential hypertension - Plan: Comprehensive metabolic panel, Lipid Panel With LDL/HDL Ratio  OSA on CPAP  Elevated glucose - Plan: Hemoglobin A1c, Lipid Panel With LDL/HDL Ratio, Insulin, random  Vitamin D deficiency - Plan: VITAMIN D 25 Hydroxy (Vit-D Deficiency, Fractures)  History of laparoscopic adjustable gastric banding  Depression screening  At risk for diabetes mellitus  Class 3 severe obesity with serious comorbidity and body mass index (BMI) of 40.0 to 44.9 in adult, unspecified obesity type (HCC)  PLAN:  Fatigue Gabrielle Moore was informed that her fatigue may be related to obesity, depression or many other causes. Labs will be ordered, and in the meanwhile Gabrielle Moore has agreed to work on diet, exercise and weight loss to help with fatigue. Proper sleep hygiene was discussed including the need for 7-8 hours of quality sleep each night. Gabrielle Moore will continue to wear the CPAP.  Dyspnea on exertion Gabrielle Moore's shortness of breath appears to be obesity related and exercise induced. She has agreed to work on weight loss and gradually increase exercise to treat her exercise induced shortness of breath. If Gabrielle Moore follows our instructions and loses weight without improvement of her shortness of breath, we will plan to refer to pulmonology. We will monitor this condition regularly. Gabrielle Moore agrees to this plan.  Hypertension We discussed sodium restriction, working on healthy weight loss, and a regular exercise program as the means to achieve improved blood pressure control. Gabrielle Moore agreed with this plan and agreed to follow up as directed. We will continue to  monitor her blood pressure as well as her progress with the above lifestyle modifications. She will continue her medications as prescribed and will watch for signs of hypotension as she continues her lifestyle modifications.  OSA (obstructive sleep apnea) on CPAP Gabrielle Moore will continue wearing her CPAP and will follow up with our clinic at the agreed upon time.  Elevated Glucose Fasting labs will be obtained (A1c and insulin level) and results with be discussed with Gabrielle Moore in 2 weeks at her follow up visit. In the meanwhile Dailyn was started on a lower simple carbohydrate diet and will work on weight loss efforts.  Diabetes risk counseling Adalynd was given extended (15 minutes) diabetes prevention counseling today. She is 63 y.o. female and has risk factors for diabetes including obesity and elevated glucose. We discussed intensive lifestyle modifications today with Gabrielle Moore emphasis on weight loss as well as increasing exercise and decreasing simple carbohydrates in her diet.  Vitamin D Deficiency Gabrielle Moore was informed that low vitamin D levels contributes to fatigue and are associated with obesity, breast, and colon cancer. We will check vitamin D level and she will follow up for routine testing of vitamin D, at least 2-3 times per year.   Depression  Gabrielle Moore had a strongly positive depression screening. Depression is commonly associated with obesity and often results in emotional eating behaviors. We will monitor this closely and work on CBT to help improve the non-hunger eating patterns. Referral to Psychology may be required if no improvement is seen as she continues in our clinic.  Obesity Gabrielle Moore is currently in the action stage of change and her goal is to continue with weight loss efforts. I recommend Gabrielle Moore begin the structured treatment plan as follows:  She has agreed to follow the Category 2 plan +45 calories Gabrielle Moore has been instructed to eventually work up to a goal of 150 minutes of combined  cardio and strengthening exercise per week for weight loss and overall health benefits. We discussed the following Behavioral Modification Strategies today: increase H2O intake, keeping healthy foods in the home, increasing lean protein intake, decreasing simple carbohydrates, increasing vegetables and work on meal planning and intentional eating   She was informed of the importance of frequent follow up visits to maximize her success with intensive lifestyle modifications for her multiple health conditions. She was informed we would discuss her lab results at her next visit unless there is a critical issue that needs to be addressed sooner. Gabrielle Moore agreed to keep her next visit at the agreed upon time to discuss these results.  ALLERGIES: Allergies  Allergen Reactions  . Codeine Nausea Only  . Keflex [Cephalexin]     ? Reaction     MEDICATIONS: Current Outpatient Medications on File Prior to Visit  Medication Sig Dispense Refill  . ibuprofen (ADVIL,MOTRIN) 200 MG tablet Take 200 mg by mouth as needed.    . valsartan-hydrochlorothiazide (DIOVAN-HCT) 80-12.5 MG tablet TAKE 1 TABLET BY MOUTH DAILY 90 tablet 0   No current facility-administered medications on file prior to visit.     PAST MEDICAL HISTORY: Past Medical History:  Diagnosis Date  . Asthma    as a child  . Gout   . Headache   . Hypertension   . Joint pain   . Pre-diabetes    "LABELED AS PRE" -- TAKES NO MEDS  . Seasonal allergies   . Sleep apnea     PAST SURGICAL HISTORY: Past Surgical History:  Procedure Laterality Date  . BREAST SURGERY  1998   breast reduction  . CESAREAN SECTION  1996  . LAPAROSCOPIC GASTRIC BANDING  2009  . LAPAROSCOPIC GASTRIC BANDING  2009  . SHOULDER SURGERY    . SINUS ENDO W/FUSION Left 05/26/2018   Procedure: ENDOSCOPIC SINUS SURGERY WITH NAVIGATION;  Surgeon: Jerrell Belfast, MD;  Location: [facility];  Service: ENT;  Laterality: Left;  . TONSILLECTOMY AND ADENOIDECTOMY    . TURBINATE  REDUCTION Bilateral 05/26/2018   Procedure: TURBINATE REDUCTION;  Surgeon: Jerrell Belfast, MD;  Location: [facility];  Service: ENT;  Laterality: Bilateral;    SOCIAL HISTORY: Social History   Tobacco Use  . Smoking status: Never Smoker  . Smokeless tobacco: Never Used  Substance Use Topics  . Alcohol use: No  . Drug use: No    FAMILY HISTORY: Family History  Problem Relation Age of Onset  . Hyperlipidemia Mother   . Hypertension Mother   . Hyperlipidemia Father   . Hypertension Father   . Heart disease Father 25  . Heart attack Father   . Depression Father   . Stroke Father   . Heart disease Other        parent     ROS: Review of Systems  Constitutional:  Positive for malaise/fatigue.  HENT:       + Dry Mouth  Eyes:       + Wear Glasses or Contacts  Respiratory: Positive for shortness of breath (with activity).   Cardiovascular: Negative for chest pain and orthopnea.       + Sudden Awakening from Sleep with Shortness of Breath  Gastrointestinal: Negative for nausea and vomiting.  Genitourinary: Negative for frequency.  Musculoskeletal:       + Muscle or Joint Pain  Endo/Heme/Allergies: Negative for polydipsia.       Positive for polyphagia  Psychiatric/Behavioral: The patient has insomnia.        + Stress    PHYSICAL EXAM: Blood pressure (!) 142/75, pulse 73, temperature 97.7 F (36.5 C), temperature source Oral, height 5\' 6"  (1.676 m), weight 266 lb (120.7 kg), SpO2 99 %. Body mass index is 42.93 kg/m. Physical Exam Vitals signs reviewed.  Constitutional:      Appearance: Normal appearance. She is well-developed. She is obese.  HENT:     Head: Normocephalic and atraumatic.     Nose: Nose normal.     Mouth/Throat:     Comments: Mallampati = 3 Eyes:     General: No scleral icterus.    Extraocular Movements: Extraocular movements intact.  Neck:     Musculoskeletal: Normal range of motion and neck supple.     Thyroid: No thyromegaly.  Cardiovascular:      Rate and Rhythm: Normal rate and regular rhythm.  Pulmonary:     Effort: Pulmonary effort is normal. No respiratory distress.  Abdominal:     Palpations: Abdomen is soft.     Tenderness: There is no abdominal tenderness.  Musculoskeletal: Normal range of motion.     Right lower leg: Edema (trace) present.     Left lower leg: Edema (trace) present.     Comments: Range of Motion normal in all 4 extremities  Skin:    General: Skin is warm and dry.  Neurological:     Mental Status: She is alert and oriented to person, place, and time.     Coordination: Coordination normal.  Psychiatric:        Mood and Affect: Mood normal.        Behavior: Behavior normal.     RECENT LABS AND TESTS: BMET    Component Value Date/Time   NA 142 09/28/2018 1556   K 4.4 09/28/2018 1556   CL 102 09/28/2018 1556   CO2 20 09/28/2018 1556   GLUCOSE 102 (H) 09/28/2018 1556   GLUCOSE 104 (H) 05/19/2018 1455   BUN 12 09/28/2018 1556   CREATININE 0.68 09/28/2018 1556   CREATININE 0.62 12/18/2017 1035   CALCIUM 9.5 09/28/2018 1556   GFRNONAA 95 09/28/2018 1556   GFRAA 109 09/28/2018 1556   Lab Results  Component Value Date   HGBA1C 6.1 (H) 09/28/2018   Lab Results  Component Value Date   INSULIN 38.3 (H) 09/28/2018   CBC    Component Value Date/Time   WBC 7.1 05/19/2018 1455   RBC 4.97 05/19/2018 1455   HGB 15.1 (H) 05/19/2018 1455   HCT 45.9 05/19/2018 1455   PLT 252 05/19/2018 1455   MCV 92.4 05/19/2018 1455   MCH 30.4 05/19/2018 1455   MCHC 32.9 05/19/2018 1455   RDW 13.0 05/19/2018 1455   LYMPHSABS 2.2 03/16/2018 1528   MONOABS 0.4 03/16/2018 1528   EOSABS 0.3 03/16/2018 1528   BASOSABS 0.0 03/16/2018 1528   Iron/TIBC/Ferritin/ %Sat No results  found for: IRON, TIBC, FERRITIN, IRONPCTSAT Lipid Panel     Component Value Date/Time   CHOL WILL FOLLOW 09/28/2018 1556   TRIG WILL FOLLOW 09/28/2018 1556   HDL WILL FOLLOW 09/28/2018 1556   CHOLHDL 4 09/07/2017 0827   VLDL 26.2  09/07/2017 0827   LDLCALC WILL FOLLOW 09/28/2018 1556   Hepatic Function Panel     Component Value Date/Time   PROT 7.4 09/28/2018 1556   ALBUMIN 4.2 09/28/2018 1556   AST 32 09/28/2018 1556   ALT 36 (H) 09/28/2018 1556   ALKPHOS 70 09/28/2018 1556   BILITOT 0.4 09/28/2018 1556      Component Value Date/Time   TSH WILL FOLLOW 09/28/2018 1556   TSH 1.85 07/19/2012 0904    ECG  shows NSR with a rate of 70 BPM INDIRECT CALORIMETER done today shows a VO2 of 248 and a REE of 1733.  Her calculated basal metabolic rate is 1740 thus her basal metabolic rate is worse than expected.       OBESITY BEHAVIORAL INTERVENTION VISIT  Today's visit was # 1   Starting weight: 266 lbs Starting date: 09/28/2018 Today's weight : 266 lbs  Today's date: 09/28/2018 Total lbs lost to date: 0   ASK: We discussed the diagnosis of obesity with Gabrielle Moore today and Gabrielle Moore agreed to give [place] permission to discuss obesity behavioral modification therapy today.  ASSESS: Gabrielle Moore has the diagnosis of obesity and her BMI today is 42.95 Gabrielle Moore is in the action stage of change   ADVISE: Anu was educated on the multiple health risks of obesity as well as the benefit of weight loss to improve her health. She was advised of the need for long term treatment and the importance of lifestyle modifications to improve her current health and to decrease her risk of future health problems.  AGREE: Multiple dietary modification options and treatment options were discussed and  Yena agreed to follow the recommendations documented in the above note.  ARRANGE: Clarissia was educated on the importance of frequent visits to treat obesity as outlined per CMS and USPSTF guidelines and agreed to schedule her next follow up appointment today.  Corey Skains, am acting as Location manager for [employer]. Owens Shark, DO  I have reviewed the above documentation for accuracy and completeness, and I agree with the above. -Jearld Lesch, DO

## 2018-09-30 ENCOUNTER — Telehealth: Payer: Self-pay

## 2018-09-30 DIAGNOSIS — Z6841 Body Mass Index (BMI) 40.0 and over, adult: Secondary | ICD-10-CM

## 2018-09-30 NOTE — Telephone Encounter (Signed)
Patient stated that she is having issues with her cpap machine. She stated that it's something wrong with the humidifier. Please advise

## 2018-09-30 NOTE — Telephone Encounter (Signed)
Called the patient. She states the machine states there was something wrong with the humidifier. Informed the patient that [employer] would be the one to trouble shoot the machine concerns. Informed the patient to contact [employer]. Advised I would also send a message to them on her behalf but informed her to call and if they didn't answer to leave a message Pt verbalized understanding.

## 2018-10-04 ENCOUNTER — Encounter: Payer: Self-pay | Admitting: Family Medicine

## 2018-10-12 ENCOUNTER — Encounter (INDEPENDENT_AMBULATORY_CARE_PROVIDER_SITE_OTHER): Payer: Self-pay | Admitting: Bariatrics

## 2018-10-12 ENCOUNTER — Ambulatory Visit (INDEPENDENT_AMBULATORY_CARE_PROVIDER_SITE_OTHER): Payer: BLUE CROSS/BLUE SHIELD | Admitting: Bariatrics

## 2018-10-12 VITALS — BP 112/74 | HR 74 | Ht 66.0 in | Wt 255.0 lb

## 2018-10-12 DIAGNOSIS — E559 Vitamin D deficiency, unspecified: Secondary | ICD-10-CM

## 2018-10-12 DIAGNOSIS — Z9189 Other specified personal risk factors, not elsewhere classified: Secondary | ICD-10-CM | POA: Diagnosis not present

## 2018-10-12 DIAGNOSIS — Z6841 Body Mass Index (BMI) 40.0 and over, adult: Secondary | ICD-10-CM

## 2018-10-12 DIAGNOSIS — I10 Essential (primary) hypertension: Secondary | ICD-10-CM

## 2018-10-12 DIAGNOSIS — R7303 Prediabetes: Secondary | ICD-10-CM | POA: Diagnosis not present

## 2018-10-12 MED ORDER — VITAMIN D (ERGOCALCIFEROL) 1.25 MG (50000 UNIT) PO CAPS: 50000.0000 [IU] | ORAL_CAPSULE | ORAL | 0 refills | Status: DC

## 2018-10-12 NOTE — Progress Notes (Signed)
Office: [phone]  /  Fax: [phone]   HPI:   Chief Complaint: OBESITY Gabrielle Moore is here to discuss her progress with her obesity treatment plan. She is on the Category 2 plan + 45 calories and is following her eating plan approximately 98 % of the time. She states she is exercising 0 minutes 0 times per week. Gabrielle Moore has been under a lot of stress. She moved her mother to assisted living. She is doing well on the plan. Her weight is 255 lb (115.7 kg) today and has had a weight loss of 11 pounds over a period of 2 weeks since her last visit. She has lost 11 lbs since starting treatment with [place].  Gabrielle Moore has a diagnosis of prediabetes based on her elevated Hgb A1c and was informed this puts her at greater risk of developing diabetes. Her last A1c was at 6.1 and last insulin level was at 38.3 She is not taking metformin currently and continues to work on diet and exercise to decrease risk of diabetes. She admits to mild polyphagia and polydipsia.  Vitamin D deficiency Gabrielle Moore has a diagnosis of vitamin D deficiency. Her last vitamin D level was at 12.4 She is not currently taking vit D and denies nausea, vomiting or muscle weakness.  Hypertension Gabrielle Moore is a 63 y.o. female with hypertension. She is taking Diovan-HCTZ. Gabrielle Moore denies chest pain or shortness of breath on exertion. She is working weight loss to help control her blood pressure with the goal of decreasing her risk of heart attack and stroke. Gabrielle Moore blood pressure is well controlled.  ASSESSMENT AND PLAN:  Prediabetes  Vitamin D deficiency - Plan: Vitamin D, Ergocalciferol, (DRISDOL) 1.25 MG (50000 UT) CAPS capsule  Essential hypertension  At risk for osteoporosis  Class 3 severe obesity with serious comorbidity and body mass index (BMI) of 40.0 to 44.9 in adult, unspecified obesity type ([place])  PLAN:  Gabrielle We discussed prediabetes in detail today. Gabrielle Moore will continue to work on  weight loss, exercise, and decreasing simple carbohydrates in her diet to help decrease the risk of diabetes. We dicussed metformin including benefits and risks. She was informed that eating too many simple carbohydrates or too many calories at one sitting increases the likelihood of GI side effects. Gabrielle Moore was offered metformin , but she declines at this time. Gabrielle Moore agreed to follow up with [place] as directed to monitor her progress.  Vitamin D Deficiency Gabrielle Moore was informed that low vitamin D levels contributes to fatigue and are associated with obesity, breast, and colon cancer. She agrees to start Vit D @50 ,000 IU every week #4 with no refills and will follow up for routine testing of vitamin D, at least 2-3 times per year. She was informed of the risk of over-replacement of vitamin D and agrees to not increase her dose unless she discusses this with [place] first. Gabrielle Moore agrees to follow up as directed.  Hypertension We discussed sodium restriction, working on healthy weight loss, and a regular exercise program as the means to achieve improved blood pressure control. Gabrielle Moore agreed with this plan and agreed to follow up as directed. We will continue to monitor her blood pressure as well as her progress with the above lifestyle modifications. She will continue her medications as prescribed and will watch for signs of hypotension as she continues her lifestyle modifications.  Obesity Gabrielle Moore is currently in the action stage of change. As such, her goal is to continue with weight loss efforts  She has agreed to follow the Category 2 plan Gabrielle Moore has been instructed to work up to a goal of 150 minutes of combined cardio and strengthening exercise per week for weight loss and overall health benefits. We discussed the following Behavioral Modification Strategies today: increase H2O intake (will carry a water bottle), keeping healthy foods in the home, increasing lean protein intake, decreasing simple carbohydrates,  increasing vegetables and work on meal planning and intentional eating  Gabrielle Moore has agreed to follow up with our clinic in 2 weeks. She was informed of the importance of frequent follow up visits to maximize her success with intensive lifestyle modifications for her multiple health conditions.  ALLERGIES: Allergies  Allergen Reactions  . Codeine Nausea Only  . Keflex [Cephalexin]     ? Reaction     MEDICATIONS: Current Outpatient Medications on File Prior to Visit  Medication Sig Dispense Refill  . ibuprofen (ADVIL,MOTRIN) 200 MG tablet Take 200 mg by mouth as needed.    . valsartan-hydrochlorothiazide (DIOVAN-HCT) 80-12.5 MG tablet TAKE 1 TABLET BY MOUTH DAILY 90 tablet 0   No current facility-administered medications on file prior to visit.     PAST MEDICAL HISTORY: Past Medical History:  Diagnosis Date  . Asthma    as a child  . Gout   . Headache   . Hypertension   . Joint pain   . Gabrielle    "LABELED AS PRE" -- TAKES NO MEDS  . Seasonal allergies   . Sleep apnea     PAST SURGICAL HISTORY: Past Surgical History:  Procedure Laterality Date  . BREAST SURGERY  1998   breast reduction  . CESAREAN SECTION  1996  . LAPAROSCOPIC GASTRIC BANDING  2009  . LAPAROSCOPIC GASTRIC BANDING  2009  . SHOULDER SURGERY    . SINUS ENDO W/FUSION Left 05/26/2018   Procedure: ENDOSCOPIC SINUS SURGERY WITH NAVIGATION;  Surgeon: Jerrell Belfast, MD;  Location: [facility];  Service: ENT;  Laterality: Left;  . TONSILLECTOMY AND ADENOIDECTOMY    . TURBINATE REDUCTION Bilateral 05/26/2018   Procedure: TURBINATE REDUCTION;  Surgeon: Jerrell Belfast, MD;  Location: [facility];  Service: ENT;  Laterality: Bilateral;    SOCIAL HISTORY: Social History   Tobacco Use  . Smoking status: Never Smoker  . Smokeless tobacco: Never Used  Substance Use Topics  . Alcohol use: No  . Drug use: No    FAMILY HISTORY: Family History  Problem Relation Age of Onset  . Hyperlipidemia Mother   .  Hypertension Mother   . Hyperlipidemia Father   . Hypertension Father   . Heart disease Father 28  . Heart attack Father   . Depression Father   . Stroke Father   . Heart disease Other        parent     ROS: Review of Systems  Constitutional: Positive for weight loss.  Respiratory: Negative for shortness of breath (on exertion).   Cardiovascular: Negative for chest pain.  Gastrointestinal: Negative for nausea and vomiting.  Musculoskeletal:       Negative for muscle weakness  Endo/Heme/Allergies: Positive for polydipsia.       Positive for polyphagia     PHYSICAL EXAM: Blood pressure 112/74, pulse 74, height 5\' 6"  (1.676 m), weight 255 lb (115.7 kg), SpO2 96 %. Body mass index is 41.16 kg/m. Physical Exam Vitals signs reviewed.  Constitutional:      Appearance: Normal appearance. She is well-developed. She is obese.  Cardiovascular:     Rate and Rhythm: Normal  rate.  Pulmonary:     Effort: Pulmonary effort is normal.  Musculoskeletal: Normal range of motion.  Skin:    General: Skin is warm and dry.  Neurological:     Mental Status: She is alert and oriented to person, place, and time.  Psychiatric:        Mood and Affect: Mood normal.        Behavior: Behavior normal.     RECENT LABS AND TESTS: BMET    Component Value Date/Time   NA 142 09/28/2018 1556   K 4.4 09/28/2018 1556   CL 102 09/28/2018 1556   CO2 20 09/28/2018 1556   GLUCOSE 102 (H) 09/28/2018 1556   GLUCOSE 104 (H) 05/19/2018 1455   BUN 12 09/28/2018 1556   CREATININE 0.68 09/28/2018 1556   CREATININE 0.62 12/18/2017 1035   CALCIUM 9.5 09/28/2018 1556   GFRNONAA 95 09/28/2018 1556   GFRAA 109 09/28/2018 1556   Lab Results  Component Value Date   HGBA1C 6.1 (H) 09/28/2018   HGBA1C 6.1 09/07/2017   HGBA1C 5.8 08/29/2016   HGBA1C 5.9 05/01/2014   HGBA1C 5.9 05/31/2013   Lab Results  Component Value Date   INSULIN 38.3 (H) 09/28/2018   CBC    Component Value Date/Time   WBC 7.1  05/19/2018 1455   RBC 4.97 05/19/2018 1455   HGB 15.1 (H) 05/19/2018 1455   HCT 45.9 05/19/2018 1455   PLT 252 05/19/2018 1455   MCV 92.4 05/19/2018 1455   MCH 30.4 05/19/2018 1455   MCHC 32.9 05/19/2018 1455   RDW 13.0 05/19/2018 1455   LYMPHSABS 2.2 03/16/2018 1528   MONOABS 0.4 03/16/2018 1528   EOSABS 0.3 03/16/2018 1528   BASOSABS 0.0 03/16/2018 1528   Iron/TIBC/Ferritin/ %Sat No results found for: IRON, TIBC, FERRITIN, IRONPCTSAT Lipid Panel     Component Value Date/Time   CHOL 171 09/28/2018 1556   TRIG 108 09/28/2018 1556   HDL 56 09/28/2018 1556   CHOLHDL 4 09/07/2017 0827   VLDL 26.2 09/07/2017 0827   LDLCALC 93 09/28/2018 1556   Hepatic Function Panel     Component Value Date/Time   PROT 7.4 09/28/2018 1556   ALBUMIN 4.2 09/28/2018 1556   AST 32 09/28/2018 1556   ALT 36 (H) 09/28/2018 1556   ALKPHOS 70 09/28/2018 1556   BILITOT 0.4 09/28/2018 1556      Component Value Date/Time   TSH 2.960 09/28/2018 1556   TSH 1.85 07/19/2012 0904   Results for BURMA, KETCHER (MRN [id number]) as of 10/13/2018 06:37  Ref. Range 09/28/2018 15:56  Vitamin D, 25-Hydroxy Latest Ref Range: 30.0 - 100.0 ng/mL 12.4 (L)     OBESITY BEHAVIORAL INTERVENTION VISIT  Today's visit was # 2   Starting weight: 266 lbs Starting date: 09/28/2018 Today's weight : 255 lbs Today's date: 10/12/2018 Total lbs lost to date: 31   ASK: We discussed the diagnosis of obesity with Gabrielle Moore today and Gabrielle Moore agreed to give [place] permission to discuss obesity behavioral modification therapy today.  ASSESS: Yenni has the diagnosis of obesity and her BMI today is 41.18 Gabrielle Moore is in the action stage of change   ADVISE: Gabrielle Moore was educated on the multiple health risks of obesity as well as the benefit of weight loss to improve her health. She was advised of the need for long term treatment and the importance of lifestyle modifications to improve her current health and to decrease her risk of  future health problems.  AGREE: Multiple  dietary modification options and treatment options were discussed and  Adda agreed to follow the recommendations documented in the above note.  ARRANGE: Gabrielle Moore was educated on the importance of frequent visits to treat obesity as outlined per CMS and USPSTF guidelines and agreed to schedule her next follow up appointment today.  Corey Skains, am acting as Location manager for [employer]. Owens Shark, DO  I have reviewed the above documentation for accuracy and completeness, and I agree with the above. -Jearld Lesch, DO

## 2018-10-13 ENCOUNTER — Encounter: Payer: Self-pay | Admitting: Family Medicine

## 2018-10-13 DIAGNOSIS — R7303 Prediabetes: Secondary | ICD-10-CM | POA: Insufficient documentation

## 2018-10-13 NOTE — Progress Notes (Addendum)
Patient has let us know she no longer wishes to be a patient here. She wrote that she is finding another doctor and "firing" current doctor. Practice admin staff advised to send letter or contact patient to confirm her leaving/requested dismissal and that we would provide urgent care for 30 days if needed.

## 2018-10-26 ENCOUNTER — Encounter (INDEPENDENT_AMBULATORY_CARE_PROVIDER_SITE_OTHER): Payer: Self-pay | Admitting: Bariatrics

## 2018-10-26 ENCOUNTER — Ambulatory Visit (INDEPENDENT_AMBULATORY_CARE_PROVIDER_SITE_OTHER): Payer: BLUE CROSS/BLUE SHIELD | Admitting: Bariatrics

## 2018-10-26 VITALS — BP 107/70 | HR 66 | Temp 97.6°F | Ht 66.0 in | Wt 249.0 lb

## 2018-10-26 DIAGNOSIS — Z6841 Body Mass Index (BMI) 40.0 and over, adult: Secondary | ICD-10-CM

## 2018-10-26 DIAGNOSIS — R7303 Prediabetes: Secondary | ICD-10-CM | POA: Diagnosis not present

## 2018-10-26 DIAGNOSIS — E559 Vitamin D deficiency, unspecified: Secondary | ICD-10-CM

## 2018-10-27 DIAGNOSIS — E559 Vitamin D deficiency, unspecified: Secondary | ICD-10-CM | POA: Insufficient documentation

## 2018-10-27 NOTE — Progress Notes (Signed)
Office: [phone]  /  Fax: [phone]   HPI:   Chief Complaint: OBESITY Gabrielle Moore is here to discuss her progress with her obesity treatment plan. She is on the Category 2 plan and is following her eating plan approximately 98 % of the time. She states she is exercising 0 minutes 0 times per week. Gabrielle Moore is doing well overall. She has been under a lot of stress (mother in skilled care). Gabrielle Moore is not doing any stress eating. Her weight is 249 lb (112.9 kg) today and has had a weight loss of 6 pounds over a period of 2 weeks since her last visit. She has lost 17 lbs since starting treatment with [place].  Vitamin D deficiency Gabrielle Moore has a diagnosis of vitamin D deficiency. She is currently taking vit D and denies nausea, vomiting or muscle weakness.  Pre-Diabetes Gabrielle Moore has a diagnosis of prediabetes based on her elevated Hgb A1c and was informed this puts her at greater risk of developing diabetes. Her last A1c was at 6.1 and last insulin level was at 38.3 She is not taking medications currently and continues to work on diet and exercise to decrease risk of diabetes. She denies nausea or hypoglycemia.  ASSESSMENT AND PLAN:  Vitamin D deficiency  Prediabetes  Class 3 severe obesity with serious comorbidity and body mass index (BMI) of 40.0 to 44.9 in adult, unspecified obesity type (Gabrielle Moore)  PLAN:  Vitamin D Deficiency Gabrielle Moore was informed that low vitamin D levels contributes to fatigue and are associated with obesity, breast, and colon cancer. She agrees to continue to take prescription Vit D @50 ,000 IU every week and will follow up for routine testing of vitamin D, at least 2-3 times per year. She was informed of the risk of over-replacement of vitamin D and agrees to not increase her dose unless she discusses this with [place] first.  Pre-Diabetes Gabrielle Moore will continue to work on weight loss, exercise, increasing lean protein and decreasing simple carbohydrates in her diet to help decrease the risk  of diabetes. She was informed that eating too many simple carbohydrates or too many calories at one sitting increases the likelihood of GI side effects. Gabrielle Moore agreed to follow up with [place] as directed to monitor her progress.  I spent > than 50% of the 15 minute visit on counseling as documented in the note.  Obesity Gabrielle Moore is currently in the action stage of change. As such, her goal is to continue with weight loss efforts She has agreed to follow the Category 2 plan Gabrielle Moore has been instructed to work up to a goal of 150 minutes of combined cardio and strengthening exercise per week or walking/[employer] several times per week for weight loss and overall health benefits. We discussed the following Behavioral Modification Strategies today: increase H2O intake, no skipping meals, keeping healthy foods in the home, increasing lean protein intake, decreasing simple carbohydrates, increasing vegetables, decrease eating out, on the road eating strategies and work on meal planning and easy cooking plans  Gabrielle Moore has agreed to follow up with our clinic in 2 weeks. She was informed of the importance of frequent follow up visits to maximize her success with intensive lifestyle modifications for her multiple health conditions.  ALLERGIES: Allergies  Allergen Reactions  . Codeine Nausea Only  . Keflex [Cephalexin]     ? Reaction     MEDICATIONS: Current Outpatient Medications on File Prior to Visit  Medication Sig Dispense Refill  . ibuprofen (ADVIL,MOTRIN) 200 MG tablet Take 200  mg by mouth as needed.    . valsartan-hydrochlorothiazide (DIOVAN-HCT) 80-12.5 MG tablet TAKE 1 TABLET BY MOUTH DAILY 90 tablet 0  . Vitamin D, Ergocalciferol, (DRISDOL) 1.25 MG (50000 UT) CAPS capsule Take 1 capsule (50,000 Units total) by mouth every 7 (seven) days. 4 capsule 0   No current facility-administered medications on file prior to visit.     PAST MEDICAL HISTORY: Past Medical History:  Diagnosis Date  .  Asthma    as a child  . Gout   . Headache   . Hypertension   . Joint pain   . Pre-diabetes    "LABELED AS PRE" -- TAKES NO MEDS  . Seasonal allergies   . Sleep apnea     PAST SURGICAL HISTORY: Past Surgical History:  Procedure Laterality Date  . BREAST SURGERY  1998   breast reduction  . CESAREAN SECTION  1996  . LAPAROSCOPIC GASTRIC BANDING  2009  . LAPAROSCOPIC GASTRIC BANDING  2009  . SHOULDER SURGERY    . SINUS ENDO W/FUSION Left 05/26/2018   Procedure: ENDOSCOPIC SINUS SURGERY WITH NAVIGATION;  Surgeon: Jerrell Belfast, MD;  Location: [facility];  Service: ENT;  Laterality: Left;  . TONSILLECTOMY AND ADENOIDECTOMY    . TURBINATE REDUCTION Bilateral 05/26/2018   Procedure: TURBINATE REDUCTION;  Surgeon: Jerrell Belfast, MD;  Location: [facility];  Service: ENT;  Laterality: Bilateral;    SOCIAL HISTORY: Social History   Tobacco Use  . Smoking status: Never Smoker  . Smokeless tobacco: Never Used  Substance Use Topics  . Alcohol use: No  . Drug use: No    FAMILY HISTORY: Family History  Problem Relation Age of Onset  . Hyperlipidemia Mother   . Hypertension Mother   . Hyperlipidemia Father   . Hypertension Father   . Heart disease Father 72  . Heart attack Father   . Depression Father   . Stroke Father   . Heart disease Other        parent     ROS: Review of Systems  Constitutional: Positive for weight loss.  Gastrointestinal: Negative for nausea and vomiting.  Musculoskeletal:       Negative for muscle weakness  Endo/Heme/Allergies:       Negative for hypoglycemia    PHYSICAL EXAM: Blood pressure 107/70, pulse 66, temperature 97.6 F (36.4 C), temperature source Oral, height 5\' 6"  (1.676 m), weight 249 lb (112.9 kg), SpO2 96 %. Body mass index is 40.19 kg/m. Physical Exam Vitals signs reviewed.  Constitutional:      Appearance: Normal appearance. She is well-developed. She is obese.  Cardiovascular:     Rate and Rhythm: Normal rate.  Pulmonary:      Effort: Pulmonary effort is normal.  Musculoskeletal: Normal range of motion.  Skin:    General: Skin is warm and dry.  Neurological:     Mental Status: She is alert and oriented to person, place, and time.  Psychiatric:        Mood and Affect: Mood normal.        Behavior: Behavior normal.     RECENT LABS AND TESTS: BMET    Component Value Date/Time   NA 142 09/28/2018 1556   K 4.4 09/28/2018 1556   CL 102 09/28/2018 1556   CO2 20 09/28/2018 1556   GLUCOSE 102 (H) 09/28/2018 1556   GLUCOSE 104 (H) 05/19/2018 1455   BUN 12 09/28/2018 1556   CREATININE 0.68 09/28/2018 1556   CREATININE 0.62 12/18/2017 1035   CALCIUM 9.5  09/28/2018 1556   GFRNONAA 95 09/28/2018 1556   GFRAA 109 09/28/2018 1556   Lab Results  Component Value Date   HGBA1C 6.1 (H) 09/28/2018   HGBA1C 6.1 09/07/2017   HGBA1C 5.8 08/29/2016   HGBA1C 5.9 05/01/2014   HGBA1C 5.9 05/31/2013   Lab Results  Component Value Date   INSULIN 38.3 (H) 09/28/2018   CBC    Component Value Date/Time   WBC 7.1 05/19/2018 1455   RBC 4.97 05/19/2018 1455   HGB 15.1 (H) 05/19/2018 1455   HCT 45.9 05/19/2018 1455   PLT 252 05/19/2018 1455   MCV 92.4 05/19/2018 1455   MCH 30.4 05/19/2018 1455   MCHC 32.9 05/19/2018 1455   RDW 13.0 05/19/2018 1455   LYMPHSABS 2.2 03/16/2018 1528   MONOABS 0.4 03/16/2018 1528   EOSABS 0.3 03/16/2018 1528   BASOSABS 0.0 03/16/2018 1528   Iron/TIBC/Ferritin/ %Sat No results found for: IRON, TIBC, FERRITIN, IRONPCTSAT Lipid Panel     Component Value Date/Time   CHOL 171 09/28/2018 1556   TRIG 108 09/28/2018 1556   HDL 56 09/28/2018 1556   CHOLHDL 4 09/07/2017 0827   VLDL 26.2 09/07/2017 0827   LDLCALC 93 09/28/2018 1556   Hepatic Function Panel     Component Value Date/Time   PROT 7.4 09/28/2018 1556   ALBUMIN 4.2 09/28/2018 1556   AST 32 09/28/2018 1556   ALT 36 (H) 09/28/2018 1556   ALKPHOS 70 09/28/2018 1556   BILITOT 0.4 09/28/2018 1556      Component Value  Date/Time   TSH 2.960 09/28/2018 1556   TSH 1.85 07/19/2012 0904     Ref. Range 09/28/2018 15:56  Vitamin D, 25-Hydroxy Latest Ref Range: 30.0 - 100.0 ng/mL 12.4 (L)     OBESITY BEHAVIORAL INTERVENTION VISIT  Today's visit was # 3   Starting weight: 266 lbs Starting date: 09/28/2018 Today's weight : 249 lbs  Today's date: 10/26/2018 Total lbs lost to date: 17    10/26/2018  Weight 249 lb (112.9 kg)    ASK: We discussed the diagnosis of obesity with Gabrielle Moore today and Gabrielle Moore agreed to give [place] permission to discuss obesity behavioral modification therapy today.  ASSESS: Gabrielle Moore has the diagnosis of obesity and her BMI today is 40.21 Gabrielle Moore is in the action stage of change   ADVISE: Gabrielle Moore was educated on the multiple health risks of obesity as well as the benefit of weight loss to improve her health. She was advised of the need for long term treatment and the importance of lifestyle modifications to improve her current health and to decrease her risk of future health problems.  AGREE: Multiple dietary modification options and treatment options were discussed and  Gabrielle Moore agreed to follow the recommendations documented in the above note.  ARRANGE: Gabrielle Moore was educated on the importance of frequent visits to treat obesity as outlined per CMS and USPSTF guidelines and agreed to schedule her next follow up appointment today.  Corey Skains, am acting as Location manager for [employer]. Owens Shark, DO  I have reviewed the above documentation for accuracy and completeness, and I agree with the above. -Jearld Lesch, DO

## 2018-10-28 ENCOUNTER — Other Ambulatory Visit: Payer: Self-pay | Admitting: Family Medicine

## 2018-11-01 DIAGNOSIS — G4733 Obstructive sleep apnea (adult) (pediatric): Secondary | ICD-10-CM | POA: Diagnosis not present

## 2018-11-06 ENCOUNTER — Other Ambulatory Visit (INDEPENDENT_AMBULATORY_CARE_PROVIDER_SITE_OTHER): Payer: Self-pay | Admitting: Bariatrics

## 2018-11-06 DIAGNOSIS — E559 Vitamin D deficiency, unspecified: Secondary | ICD-10-CM

## 2018-11-10 ENCOUNTER — Ambulatory Visit (INDEPENDENT_AMBULATORY_CARE_PROVIDER_SITE_OTHER): Payer: BLUE CROSS/BLUE SHIELD | Admitting: Bariatrics

## 2018-11-10 ENCOUNTER — Encounter (INDEPENDENT_AMBULATORY_CARE_PROVIDER_SITE_OTHER): Payer: Self-pay | Admitting: Bariatrics

## 2018-11-10 VITALS — BP 115/76 | HR 74 | Temp 97.7°F | Ht 66.0 in | Wt 248.0 lb

## 2018-11-10 DIAGNOSIS — I10 Essential (primary) hypertension: Secondary | ICD-10-CM

## 2018-11-10 DIAGNOSIS — R7303 Prediabetes: Secondary | ICD-10-CM | POA: Diagnosis not present

## 2018-11-10 DIAGNOSIS — E559 Vitamin D deficiency, unspecified: Secondary | ICD-10-CM

## 2018-11-10 DIAGNOSIS — Z9189 Other specified personal risk factors, not elsewhere classified: Secondary | ICD-10-CM

## 2018-11-10 DIAGNOSIS — Z6841 Body Mass Index (BMI) 40.0 and over, adult: Secondary | ICD-10-CM

## 2018-11-10 MED ORDER — VITAMIN D (ERGOCALCIFEROL) 1.25 MG (50000 UNIT) PO CAPS: 50000.0000 [IU] | ORAL_CAPSULE | ORAL | 0 refills | Status: DC

## 2018-11-15 NOTE — Progress Notes (Signed)
Office: [phone]  /  Fax: [phone]   HPI:   Chief Complaint: OBESITY Gabrielle Moore is here to discuss her progress with her obesity treatment plan. She is on the Category 2 plan and is following her eating plan approximately 85 % of the time. She states she is exercising 0 minutes 0 times per week. Gabrielle Moore is doing well. She has been traveling more and probably not getting enough protein.  Her weight is 248 lb (112.5 kg) today and has had a weight loss of 1 pound over a period of 2 weeks since her last visit. She has lost 18 lbs since starting treatment with Gabrielle Moore.  Vitamin D deficiency Gabrielle Moore has a diagnosis of vitamin D deficiency. She is currently taking vit D and her last level was 12.4 on 09/28/18. She denies nausea, vomiting, or muscle weakness.  At risk for osteopenia and osteoporosis Gabrielle Moore is at higher risk of osteopenia and osteoporosis due to vitamin D deficiency.   Hypertension Gabrielle Moore is a 63 y.o. female with hypertension. Gabrielle Moore's blood pressure is currently well controlled and she is taking Diovan-HCT 80-12.5mg . She is working on weight loss to help control her blood pressure with the goal of decreasing her risk of heart attack and stroke.  Pre-Diabetes Gabrielle Moore has a diagnosis of pre-diabetes based on her elevated Hgb A1c and was informed this puts her at greater risk of developing diabetes. She is not taking medications currently and continues to work on diet and exercise to decrease risk of diabetes. She denies polyphagia or hypoglycemia.  ASSESSMENT AND PLAN:  Vitamin D deficiency - Plan: Vitamin D, Ergocalciferol, (DRISDOL) 1.25 MG (50000 UT) CAPS capsule  Essential hypertension  Prediabetes  At risk for osteoporosis  Class 3 severe obesity with serious comorbidity and body mass index (BMI) of 40.0 to 44.9 in adult, unspecified obesity type ([place])  PLAN:  Vitamin D Deficiency Gabrielle Moore was informed that low vitamin D levels contributes to fatigue and are  associated with obesity, breast, and colon cancer. Gabrielle Moore agrees to continue to take prescription Vit D @50 ,000 IU every week #4 with no refills and will follow up for routine testing of vitamin D, at least 2-3 times per year. She was informed of the risk of over-replacement of vitamin D and agrees to not increase her dose unless she discusses this with Gabrielle Moore first. Gabrielle Moore agrees to follow up in 2 weeks as directed.  At risk for osteopenia and osteoporosis Gabrielle Moore was given extended (15 minutes) osteoporosis prevention counseling today. Gabrielle Moore is at risk for osteopenia and osteoporosis due to her vitamin D deficiency. She was encouraged to take her vitamin D and follow her higher calcium diet and increase strengthening exercise to help strengthen her bones and decrease her risk of osteopenia and osteoporosis.  Hypertension We discussed sodium restriction, working on healthy weight loss, and a regular exercise program as the means to achieve improved blood pressure control. We will continue to monitor her blood pressure as well as her progress with the above lifestyle modifications. She will continue her medications as prescribed and will watch for signs of hypotension as she continues her lifestyle modifications. Gabrielle Moore agreed with this plan and agreed to follow up as directed.  Pre-Diabetes Gabrielle Moore will continue to work on weight loss, exercise, and decreasing simple carbohydrates in her diet to help decrease the risk of diabetes. She was informed that eating too many simple carbohydrates or too many calories at one sitting increases the likelihood of GI side effects. Gabrielle Moore agreed  to increase protein and decrease carbohydrates in her diet. Gabrielle Moore will follow up with Gabrielle Moore as directed to monitor her progress.  Obesity Gabrielle Moore is currently in the action stage of change. As such, her goal is to continue with weight loss efforts. She has agreed to follow the Category 2 plan with additional 2 slices of bread and increased  protein. We discussed other sources of protein. Gabrielle Moore has been instructed to increase walking around the house. We discussed the following Behavioral Modification Strategies today: increasing lean protein intake, decreasing simple carbohydrates, increasing vegetables, increase H2O intake, no skipping meals, decrease eating out, keeping healthy foods in the home, work on meal planning and easy cooking plans, and decrease liquid calories.  Gabrielle Moore has agreed to follow up with our clinic in 2 weeks. She was informed of the importance of frequent follow up visits to maximize her success with intensive lifestyle modifications for her multiple health conditions.  ALLERGIES: Allergies  Allergen Reactions  . Codeine Nausea Only  . Keflex [Cephalexin]     ? Reaction     MEDICATIONS: Current Outpatient Medications on File Prior to Visit  Medication Sig Dispense Refill  . ibuprofen (ADVIL,MOTRIN) 200 MG tablet Take 200 mg by mouth as needed.    . valsartan-hydrochlorothiazide (DIOVAN-HCT) 80-12.5 MG tablet TAKE 1 TABLET BY MOUTH DAILY 90 tablet 0   No current facility-administered medications on file prior to visit.     PAST MEDICAL HISTORY: Past Medical History:  Diagnosis Date  . Asthma    as a child  . Gout   . Headache   . Hypertension   . Joint pain   . Pre-diabetes    "LABELED AS PRE" -- TAKES NO MEDS  . Seasonal allergies   . Sleep apnea     PAST SURGICAL HISTORY: Past Surgical History:  Procedure Laterality Date  . BREAST SURGERY  1998   breast reduction  . CESAREAN SECTION  1996  . LAPAROSCOPIC GASTRIC BANDING  2009  . LAPAROSCOPIC GASTRIC BANDING  2009  . SHOULDER SURGERY    . SINUS ENDO W/FUSION Left 05/26/2018   Procedure: ENDOSCOPIC SINUS SURGERY WITH NAVIGATION;  Surgeon: Jerrell Belfast, MD;  Location: [facility];  Service: ENT;  Laterality: Left;  . TONSILLECTOMY AND ADENOIDECTOMY    . TURBINATE REDUCTION Bilateral 05/26/2018   Procedure: TURBINATE REDUCTION;  Surgeon:  Jerrell Belfast, MD;  Location: [facility];  Service: ENT;  Laterality: Bilateral;    SOCIAL HISTORY: Social History   Tobacco Use  . Smoking status: Never Smoker  . Smokeless tobacco: Never Used  Substance Use Topics  . Alcohol use: No  . Drug use: No    FAMILY HISTORY: Family History  Problem Relation Age of Onset  . Hyperlipidemia Mother   . Hypertension Mother   . Hyperlipidemia Father   . Hypertension Father   . Heart disease Father 37  . Heart attack Father   . Depression Father   . Stroke Father   . Heart disease Other        parent     ROS: Review of Systems  Constitutional: Positive for weight loss.  Gastrointestinal: Negative for nausea and vomiting.  Musculoskeletal:       Negative for muscle weakness.  Endo/Heme/Allergies:       Negative for polyphagia. Negative for hypoglycemia.    PHYSICAL EXAM: Blood pressure 115/76, pulse 74, temperature 97.7 F (36.5 C), temperature source Oral, height 5\' 6"  (1.676 m), weight 248 lb (112.5 kg), SpO2 96 %. Body  mass index is 40.03 kg/m. Physical Exam Vitals signs reviewed.  Constitutional:      Appearance: Normal appearance. She is obese.  Cardiovascular:     Rate and Rhythm: Normal rate.  Pulmonary:     Effort: Pulmonary effort is normal.  Musculoskeletal: Normal range of motion.  Skin:    General: Skin is warm and dry.  Neurological:     Mental Status: She is alert and oriented to person, place, and time.  Psychiatric:        Mood and Affect: Mood normal.        Behavior: Behavior normal.     RECENT LABS AND TESTS: BMET    Component Value Date/Time   NA 142 09/28/2018 1556   K 4.4 09/28/2018 1556   CL 102 09/28/2018 1556   CO2 20 09/28/2018 1556   GLUCOSE 102 (H) 09/28/2018 1556   GLUCOSE 104 (H) 05/19/2018 1455   BUN 12 09/28/2018 1556   CREATININE 0.68 09/28/2018 1556   CREATININE 0.62 12/18/2017 1035   CALCIUM 9.5 09/28/2018 1556   GFRNONAA 95 09/28/2018 1556   GFRAA 109 09/28/2018 1556     Lab Results  Component Value Date   HGBA1C 6.1 (H) 09/28/2018   HGBA1C 6.1 09/07/2017   HGBA1C 5.8 08/29/2016   HGBA1C 5.9 05/01/2014   HGBA1C 5.9 05/31/2013   Lab Results  Component Value Date   INSULIN 38.3 (H) 09/28/2018   CBC    Component Value Date/Time   WBC 7.1 05/19/2018 1455   RBC 4.97 05/19/2018 1455   HGB 15.1 (H) 05/19/2018 1455   HCT 45.9 05/19/2018 1455   PLT 252 05/19/2018 1455   MCV 92.4 05/19/2018 1455   MCH 30.4 05/19/2018 1455   MCHC 32.9 05/19/2018 1455   RDW 13.0 05/19/2018 1455   LYMPHSABS 2.2 03/16/2018 1528   MONOABS 0.4 03/16/2018 1528   EOSABS 0.3 03/16/2018 1528   BASOSABS 0.0 03/16/2018 1528   Iron/TIBC/Ferritin/ %Sat No results found for: IRON, TIBC, FERRITIN, IRONPCTSAT Lipid Panel     Component Value Date/Time   CHOL 171 09/28/2018 1556   TRIG 108 09/28/2018 1556   HDL 56 09/28/2018 1556   CHOLHDL 4 09/07/2017 0827   VLDL 26.2 09/07/2017 0827   LDLCALC 93 09/28/2018 1556   Hepatic Function Panel     Component Value Date/Time   PROT 7.4 09/28/2018 1556   ALBUMIN 4.2 09/28/2018 1556   AST 32 09/28/2018 1556   ALT 36 (H) 09/28/2018 1556   ALKPHOS 70 09/28/2018 1556   BILITOT 0.4 09/28/2018 1556      Component Value Date/Time   TSH 2.960 09/28/2018 1556   TSH 1.85 07/19/2012 0904   Results for ANALIYA, PORCO (MRN [id number]) as of 11/15/2018 12:06  Ref. Range 09/28/2018 15:56  Vitamin D, 25-Hydroxy Latest Ref Range: 30.0 - 100.0 ng/mL 12.4 (L)   OBESITY BEHAVIORAL INTERVENTION VISIT  Today's visit was # 4   Starting weight: 266 lbs Starting date: 09/28/18 Today's weight : Weight: 248 lb (112.5 kg)  Today's date: 11/10/2018 Total lbs lost to date: 18    11/10/2018  Height 5\' 6"  (1.676 m)  Weight 248 lb (112.5 kg)  BMI (Calculated) 40.05  BLOOD PRESSURE - SYSTOLIC 409  BLOOD PRESSURE - DIASTOLIC 76   Body Fat % 81.1 %  Total Body Water (lbs) 90.2 lbs   ASK: We discussed the diagnosis of obesity with Gabrielle Moore today and Gabrielle Moore agreed to give Gabrielle Moore permission to discuss obesity behavioral modification  therapy today.  ASSESS: Kerline has the diagnosis of obesity and her BMI today is 40.05. Gabrielle Moore is in the action stage of change.   ADVISE: Naraya was educated on the multiple health risks of obesity as well as the benefit of weight loss to improve her health. She was advised of the need for long term treatment and the importance of lifestyle modifications to improve her current health and to decrease her risk of future health problems.  AGREE: Multiple dietary modification options and treatment options were discussed and Gabrielle Moore agreed to follow the recommendations documented in the above note.  ARRANGE: Gabrielle Moore was educated on the importance of frequent visits to treat obesity as outlined per CMS and USPSTF guidelines and agreed to schedule her next follow up appointment today.  I, Marcille Blanco, CMA, am acting as Location manager for [employer]. Owens Shark, DO  I have reviewed the above documentation for accuracy and completeness, and I agree with the above. -Jearld Lesch, DO

## 2018-11-30 ENCOUNTER — Ambulatory Visit (INDEPENDENT_AMBULATORY_CARE_PROVIDER_SITE_OTHER): Payer: BLUE CROSS/BLUE SHIELD | Admitting: Bariatrics

## 2018-11-30 ENCOUNTER — Other Ambulatory Visit: Payer: Self-pay

## 2018-11-30 ENCOUNTER — Ambulatory Visit: Payer: BLUE CROSS/BLUE SHIELD | Admitting: Podiatry

## 2018-11-30 ENCOUNTER — Encounter (INDEPENDENT_AMBULATORY_CARE_PROVIDER_SITE_OTHER): Payer: Self-pay

## 2018-11-30 DIAGNOSIS — M7751 Other enthesopathy of right foot: Secondary | ICD-10-CM

## 2018-11-30 DIAGNOSIS — M109 Gout, unspecified: Secondary | ICD-10-CM

## 2018-11-30 DIAGNOSIS — M779 Enthesopathy, unspecified: Secondary | ICD-10-CM

## 2018-11-30 MED ORDER — TRIAMCINOLONE ACETONIDE 10 MG/ML IJ SUSP
10.0000 mg | Freq: Once | INTRAMUSCULAR | Status: AC
  Administered 2018-11-30: 10 mg

## 2018-11-30 NOTE — Patient Instructions (Signed)

## 2018-12-02 ENCOUNTER — Encounter (INDEPENDENT_AMBULATORY_CARE_PROVIDER_SITE_OTHER): Payer: Self-pay

## 2018-12-02 ENCOUNTER — Ambulatory Visit: Payer: 59 | Admitting: Family Medicine

## 2018-12-06 ENCOUNTER — Other Ambulatory Visit (INDEPENDENT_AMBULATORY_CARE_PROVIDER_SITE_OTHER): Payer: Self-pay | Admitting: Bariatrics

## 2018-12-06 DIAGNOSIS — E559 Vitamin D deficiency, unspecified: Secondary | ICD-10-CM

## 2018-12-06 NOTE — Progress Notes (Signed)
Subjective: 63 year old female presents the office today for concerns of a gout flare in the right foot on the big toe joint.  This is been ongoing for last couple of days.  She denies any recent injury or trauma but she says the area is painful and has been red and swollen to the big toe joint. She feels that she is been more stressed recently and this contributing to the gout. Denies any systemic complaints such as fevers, chills, nausea, vomiting. No acute changes since last appointment, and no other complaints at this time.   Objective: AAO x3, NAD DP/PT pulses palpable bilaterally, CRT less than 3 seconds There is tenderness directly on the medial aspect of the first MPJ and there is edema and localized erythema to the first MPJ.  Is no ascending cellulitis.  There is no open sores.  There is no fluctuation or crepitation. No open lesions or pre-ulcerative lesions.  No pain with calf compression, swelling, warmth, erythema  Assessment: Right foot gout, capsulitis  Plan: -All treatment options discussed with the patient including all alternatives, risks, complications.  -Steroid injection performed today.  See procedure note below.  She wants to hold off on oral medications.  She is to let me know if she is not getting significant pain with the injection that time will maybe start colchicine.  We discussed diet modifications and monitor medications for gout. -Patient encouraged to call the office with any questions, concerns, change in symptoms.   Procedure: Injection small joint Discussed alternatives, risks, complications and verbal consent was obtained.  Location: Right first MPJ (into and around the joint) Skin Prep: Betadine Injectate: 0.5cc 0.5% marcaine plain, 0.5 cc 2% lidocaine plain and, 1 cc kenalog 10. Disposition: Patient tolerated procedure well. Injection site dressed with a band-aid.  Post-injection care was discussed and return precautions discussed.   Trula Slade  DPM

## 2018-12-24 DIAGNOSIS — G4733 Obstructive sleep apnea (adult) (pediatric): Secondary | ICD-10-CM | POA: Diagnosis not present

## 2019-01-27 DIAGNOSIS — Z13220 Encounter for screening for lipoid disorders: Secondary | ICD-10-CM | POA: Diagnosis not present

## 2019-01-27 DIAGNOSIS — I10 Essential (primary) hypertension: Secondary | ICD-10-CM | POA: Diagnosis not present

## 2019-04-06 DIAGNOSIS — M79631 Pain in right forearm: Secondary | ICD-10-CM | POA: Diagnosis not present

## 2019-05-03 ENCOUNTER — Ambulatory Visit: Payer: BC Managed Care – PPO | Admitting: Podiatry

## 2019-05-03 ENCOUNTER — Other Ambulatory Visit: Payer: Self-pay

## 2019-05-03 ENCOUNTER — Encounter: Payer: Self-pay | Admitting: Podiatry

## 2019-05-03 VITALS — Temp 97.7°F

## 2019-05-03 DIAGNOSIS — M779 Enthesopathy, unspecified: Secondary | ICD-10-CM

## 2019-05-03 DIAGNOSIS — M109 Gout, unspecified: Secondary | ICD-10-CM

## 2019-05-03 MED ORDER — COLCHICINE 0.6 MG PO TABS: 0.6000 mg | ORAL_TABLET | Freq: Every day | ORAL | 0 refills | Status: DC

## 2019-05-03 NOTE — Patient Instructions (Signed)
Gout  Gout is painful swelling of your joints. Gout is a type of arthritis. It is caused by having too much uric acid in your body. Uric acid is a chemical that is made when your body breaks down substances called purines. If your body has too much uric acid, sharp crystals can form and build up in your joints. This causes pain and swelling. Gout attacks can happen quickly and be very painful (acute gout). Over time, the attacks can affect more joints and happen more often (chronic gout). What are the causes?  Too much uric acid in your blood. This can happen because: ? Your kidneys do not remove enough uric acid from your blood. ? Your body makes too much uric acid. ? You eat too many foods that are high in purines. These foods include organ meats, some seafood, and beer.  Trauma or stress. What increases the risk?  Having a family history of gout.  Being female and middle-aged.  Being female and having gone through menopause.  Being very overweight (obese).  Drinking alcohol, especially beer.  Not having enough water in the body (being dehydrated).  Losing weight too quickly.  Having an organ transplant.  Having lead poisoning.  Taking certain medicines.  Having kidney disease.  Having a skin condition called psoriasis. What are the signs or symptoms? An attack of acute gout usually happens in just one joint. The most common place is the big toe. Attacks often start at night. Other joints that may be affected include joints of the feet, ankle, knee, fingers, wrist, or elbow. Symptoms of an attack may include:  Very bad pain.  Warmth.  Swelling.  Stiffness.  Shiny, red, or purple skin.  Tenderness. The affected joint may be very painful to touch.  Chills and fever. Chronic gout may cause symptoms more often. More joints may be involved. You may also have white or yellow lumps (tophi) on your hands or feet or in other areas near your joints. How is this  treated?  Treatment for this condition has two phases: treating an acute attack and preventing future attacks.  Acute gout treatment may include: ? NSAIDs. ? Steroids. These are taken by mouth or injected into a joint. ? Colchicine. This medicine relieves pain and swelling. It can be given by mouth or through an IV tube.  Preventive treatment may include: ? Taking small doses of NSAIDs or colchicine daily. ? Using a medicine that reduces uric acid levels in your blood. ? Making changes to your diet. You may need to see a food expert (dietitian) about what to eat and drink to prevent gout. Follow these instructions at home: During a gout attack   If told, put ice on the painful area: ? Put ice in a plastic bag. ? Place a towel between your skin and the bag. ? Leave the ice on for 20 minutes, 2-3 times a day.  Raise (elevate) the painful joint above the level of your heart as often as you can.  Rest the joint as much as possible. If the joint is in your leg, you may be given crutches.  Follow instructions from your doctor about what you cannot eat or drink. Avoiding future gout attacks  Eat a low-purine diet. Avoid foods and drinks such as: ? Liver. ? Kidney. ? Anchovies. ? Asparagus. ? Herring. ? Mushrooms. ? Mussels. ? Beer.  Stay at a healthy weight. If you want to lose weight, talk with your doctor. Do not lose weight   too fast.  Start or continue an exercise plan as told by your doctor. Eating and drinking  Drink enough fluids to keep your pee (urine) pale yellow.  If you drink alcohol: ? Limit how much you use to:  0-1 drink a day for women.  0-2 drinks a day for men. ? Be aware of how much alcohol is in your drink. In the [place] one drink equals one 12 oz bottle of beer (355 mL), one 5 oz glass of wine (148 mL), or one 1 oz glass of hard liquor (44 mL). General instructions  Take over-the-counter and prescription medicines only as told by your doctor.  Do  not drive or use heavy machinery while taking prescription pain medicine.  Return to your normal activities as told by your doctor. Ask your doctor what activities are safe for you.  Keep all follow-up visits as told by your doctor. This is important. Contact a doctor if:  You have another gout attack.  You still have symptoms of a gout attack after 10 days of treatment.  You have problems (side effects) because of your medicines.  You have chills or a fever.  You have burning pain when you pee (urinate).  You have pain in your lower back or belly. Get help right away if:  You have very bad pain.  Your pain cannot be controlled.  You cannot pee. Summary  Gout is painful swelling of the joints.  The most common site of pain is the big toe, but it can affect other joints.  Medicines and avoiding some foods can help to prevent and treat gout attacks. This information is not intended to replace advice given to you by your health care provider. Make sure you discuss any questions you have with your health care provider. Document Released: 06/03/2008 Document Revised: 03/17/2018 Document Reviewed: 03/17/2018 Elsevier Patient Education  2020 [employer]  

## 2019-05-04 LAB — URIC ACID: Uric Acid, Serum: 8 mg/dL — ABNORMAL HIGH (ref 2.5–7.0)

## 2019-05-05 ENCOUNTER — Other Ambulatory Visit: Payer: Self-pay | Admitting: Podiatry

## 2019-05-05 ENCOUNTER — Telehealth: Payer: Self-pay | Admitting: *Deleted

## 2019-05-05 MED ORDER — ALLOPURINOL 100 MG PO TABS: 100.0000 mg | ORAL_TABLET | Freq: Every day | ORAL | 2 refills | Status: DC

## 2019-05-05 NOTE — Telephone Encounter (Signed)
I informed pt of Dr. Leigh Aurora review of results and orders. Pt asked the uric acid level and normal range. I informed pt the Uric Acid was 8.0, with normals of 2.5 - 7.0. Pt states understanding.

## 2019-05-05 NOTE — Telephone Encounter (Signed)
-----   Message from Trula Slade, DPM sent at 05/05/2019  7:17 AM EDT ----- I have sent allopurinol 100mg  daily to the pharmacy to start once she finishes the colchicine.

## 2019-05-08 NOTE — Progress Notes (Signed)
Subjective: 63 year old female presents the office today for concerns of a flareup of gout to her right foot, big toe is about 4 days ago.  She states that it came on suddenly.  She also is interested in starting allopurinol.  She states that she has a friend that basis which is been helpful for gout.  Denies any systemic complaints such as fevers, chills, nausea, vomiting. No acute changes since last appointment, and no other complaints at this time.   Objective: AAO x3, NAD DP/PT pulses palpable bilaterally, CRT less than 3 seconds There is localized edema and erythema and pain on the right first MPJ.  No ascending cellulitis no open sores.  No fluctuation crepitation either signs of infection. No open lesions or pre-ulcerative lesions.  No pain with calf compression, swelling, warmth, erythema  Assessment: Capsulitis, gout right first MPJ  Plan: -All treatment options discussed with the patient including all alternatives, risks, complications.  -Steroid injection performed.  The skin was prepped with Betadine.  A mixture of 1 cc Kenalog 10, 0.5 cc of Marcaine plain, 0.5 cc of lidocaine plain was infiltrated into the area of maximal tenderness no complications.  Prescribed colchicine.  Also discussed allopurinol.  We will check a uric acid level. -Patient encouraged to call the office with any questions, concerns, change in symptoms.   Trula Slade DPM

## 2019-05-19 DIAGNOSIS — H5213 Myopia, bilateral: Secondary | ICD-10-CM | POA: Diagnosis not present

## 2019-05-26 DIAGNOSIS — M7989 Other specified soft tissue disorders: Secondary | ICD-10-CM | POA: Diagnosis not present

## 2019-06-02 ENCOUNTER — Other Ambulatory Visit: Payer: Self-pay

## 2019-06-02 ENCOUNTER — Telehealth: Payer: Self-pay | Admitting: *Deleted

## 2019-06-02 ENCOUNTER — Ambulatory Visit: Payer: BC Managed Care – PPO | Admitting: Podiatry

## 2019-06-02 DIAGNOSIS — M109 Gout, unspecified: Secondary | ICD-10-CM

## 2019-06-02 DIAGNOSIS — R2241 Localized swelling, mass and lump, right lower limb: Secondary | ICD-10-CM | POA: Diagnosis not present

## 2019-06-02 DIAGNOSIS — M25561 Pain in right knee: Secondary | ICD-10-CM | POA: Diagnosis not present

## 2019-06-02 NOTE — Telephone Encounter (Signed)
I informed pt I was able to get the uric acid labs added to the already drawn blood specimen.

## 2019-06-02 NOTE — Telephone Encounter (Signed)
I called [facility] [place] states she will add the uric acid to the already drawn blood labs.

## 2019-06-08 NOTE — Progress Notes (Signed)
Subjective: 63 year old female presents the office today for follow-up evaluation of gout.  She still taking allopurinol 100 mg and she is tolerating this well.  She not had any reoccurrence of the gout since I last saw her.  Denies any systemic complaints such as fevers, chills, nausea, vomiting. No acute changes since last appointment, and no other complaints at this time.   Objective: AAO x3, NAD DP/PT pulses palpable bilaterally, CRT less than 3 seconds At this time there is no signs of active gout flare bilaterally there is no pain to bilateral lower extremities no edema, erythema. No open lesions or pre-ulcerative lesions.  No pain with calf compression, swelling, warmth, erythema  Assessment: Resolved gout right foot currently on allopurinol  Plan: -All treatment options discussed with the patient including all alternatives, risks, complications.  -For now continue current allopurinol dose.  I do want to recheck a uric acid level.  Once we receive this we will consider increasing the dose if needed.  Discussed diet modifications.  Return in about 6 months (around 11/30/2019).  Trula Slade DPM

## 2019-06-10 ENCOUNTER — Other Ambulatory Visit: Payer: Self-pay | Admitting: Orthopedic Surgery

## 2019-06-10 DIAGNOSIS — G8929 Other chronic pain: Secondary | ICD-10-CM

## 2019-06-23 ENCOUNTER — Other Ambulatory Visit: Payer: Self-pay

## 2019-06-23 ENCOUNTER — Ambulatory Visit: Payer: 59 | Admitting: Adult Health

## 2019-06-23 ENCOUNTER — Encounter: Payer: Self-pay | Admitting: Adult Health

## 2019-06-23 VITALS — BP 137/77 | HR 78 | Temp 97.6°F | Ht 66.0 in | Wt 264.8 lb

## 2019-06-23 DIAGNOSIS — Z9989 Dependence on other enabling machines and devices: Secondary | ICD-10-CM | POA: Diagnosis not present

## 2019-06-23 DIAGNOSIS — G4733 Obstructive sleep apnea (adult) (pediatric): Secondary | ICD-10-CM

## 2019-06-23 NOTE — Progress Notes (Signed)
PATIENT: Gabrielle Moore DOB: 1956/02/16  REASON FOR VISIT: follow up HISTORY FROM: patient  HISTORY OF PRESENT ILLNESS: Today 06/23/19:  Gabrielle Moore is a 63 year old female with a history of obstructive sleep apnea on CPAP.  She returns today for follow-up.  Her download indicates that she use her machine nightly for compliance of 100%.  She used her machine greater than 4 hours 29 days for compliance of 97%.  On average she uses her machine 6 hours and 47 minutes.  Her residual AHI is 0.2 on 6 to 12 cm of water with EPR of 3.  Her leak in the 95th percentile is 19.9 L/min.  She reports that the CPAP continues to work well for her.  She returns today for an evaluation.  HISTORY Interval history from 16 June 2018, I have the pleasure of seeing Gabrielle Moore today a 63 year old female patient who has been using CPAP for at least 2 years now.  She always used to have a compliance of 100% but recently underwent sinus surgery in mid September of this year.  Until the beginning of October she had cut the use of her CPAP to shorter hours, but has now resumed 100% compliance for the last 9 days.  Her compliance by days is 97% at bedtime 63% her average usage is 5 hours nocturnally, she is using an AutoSet between 6 and 12 cmH2O pressure with 3 cm EPR and a residual AHI of only 0.1/h.  This is an almost complete resolution of sleep apnea.  She does not have major air leaks the 95th percentile pressure is 10.6 cm water.  No central apneas are emerging.  The Epworth sleepiness score was endorsed at 5 out of 24 possible points fatigue severity at 33 out of 63 points in geriatric depression at 2 out of 15 points. Sinus surgery was necessary due to severe headaches, sinusitis and nasal airway obstruction.  REVIEW OF SYSTEMS: Out of a complete 14 system review of symptoms, the patient complains only of the following symptoms, and all other reviewed systems are negative.  See HPI ALLERGIES:  Allergies  Allergen Reactions  . Codeine Nausea Only  . Keflex [Cephalexin]     ? Reaction     HOME MEDICATIONS: Outpatient Medications Prior to Visit  Medication Sig Dispense Refill  . allopurinol (ZYLOPRIM) 100 MG tablet Take 1 tablet (100 mg total) by mouth daily. 30 tablet 2  . ibuprofen (ADVIL,MOTRIN) 200 MG tablet Take 200 mg by mouth as needed.    . loratadine (CLARITIN) 10 MG tablet Take 10 mg by mouth daily.    . valsartan-hydrochlorothiazide (DIOVAN-HCT) 80-12.5 MG tablet TAKE 1 TABLET BY MOUTH DAILY 90 tablet 0  . colchicine 0.6 MG tablet Take 1 tablet (0.6 mg total) by mouth daily. 10 tablet 0   No facility-administered medications prior to visit.     PAST MEDICAL HISTORY: Past Medical History:  Diagnosis Date  . Asthma    as a child  . Gout   . Headache   . Hypertension   . Joint pain   . Pre-diabetes    "LABELED AS PRE" -- TAKES NO MEDS  . Seasonal allergies   . Sleep apnea     PAST SURGICAL HISTORY: Past Surgical History:  Procedure Laterality Date  . BREAST SURGERY  1998   breast reduction  . CESAREAN SECTION  1996  . LAPAROSCOPIC GASTRIC BANDING  2009  . LAPAROSCOPIC GASTRIC BANDING  2009  . SHOULDER SURGERY    .  SINUS ENDO W/FUSION Left 05/26/2018   Procedure: ENDOSCOPIC SINUS SURGERY WITH NAVIGATION;  Surgeon: Jerrell Belfast, MD;  Location: [facility];  Service: ENT;  Laterality: Left;  . TONSILLECTOMY AND ADENOIDECTOMY    . TURBINATE REDUCTION Bilateral 05/26/2018   Procedure: TURBINATE REDUCTION;  Surgeon: Jerrell Belfast, MD;  Location: [facility];  Service: ENT;  Laterality: Bilateral;    FAMILY HISTORY: Family History  Problem Relation Age of Onset  . Hyperlipidemia Mother   . Hypertension Mother   . Hyperlipidemia Father   . Hypertension Father   . Heart disease Father 47  . Heart attack Father   . Depression Father   . Stroke Father   . Heart disease Other        parent     SOCIAL HISTORY: Social History   Socioeconomic History  .  Marital status: Married    Spouse name: Jori Moll  . Number of children: Not on file  . Years of education: Not on file  . Highest education level: Not on file  Occupational History  . 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[employer]  Social Needs  . Financial resource strain: Not on file  . Food insecurity    Worry: Not on file    Inability: Not on file  . Transportation needs    Medical: Not on file    Non-medical: Not on file  Tobacco Use  . Smoking status: Never Smoker  . Smokeless tobacco: Never Used  Substance and Sexual Activity  . Alcohol use: No  . Drug use: No  . Sexual activity: Not on file  Lifestyle  . Physical activity    Days per week: Not on file    Minutes per session: Not on file  . Stress: Not on file  Relationships  . Social Herbalist on phone: Not on file    Gets together: Not on file    Attends religious service: Not on file    Active member of club or organization: Not on file    Attends meetings of clubs or organizations: Not on file    Relationship status: Not on file  . Intimate partner violence    Fear of current or ex partner: Not on file    Emotionally abused: Not on file    Physically abused: Not on file    Forced sexual activity: Not on file  Other Topics Concern  . Not on file  Social History Narrative  . Not on file      PHYSICAL EXAM  Vitals:   06/23/19 1454  BP: 137/77  Pulse: 78  Temp: 97.6 F (36.4 C)  TempSrc: Oral  Weight: 264 lb 12.8 oz (120.1 kg)  Height: 5\' 6"  (1.676 m)   Body mass index is 42.74 kg/m.  Generalized: Well developed, in no acute distress  Chest: Lungs clear to auscultation bilaterally  Neurological examination  Mentation: Alert oriented to time, place, history taking. Follows all commands speech and language fluent Cranial nerve II-XII: Extraocular movements were full, visual field were full on confrontational test Head turning and shoulder shrug  were normal and  symmetric. Motor: The motor testing reveals 5 over 5 strength of all 4 extremities. Good symmetric motor tone is noted throughout.  Sensory: Sensory testing is intact to soft touch on all 4 extremities. No evidence of extinction is noted.  Gait and station: Gait is normal.    DIAGNOSTIC DATA (LABS, IMAGING, TESTING) - I reviewed patient records, labs,  notes, testing and imaging myself where available.  Lab Results  Component Value Date   WBC 7.1 05/19/2018   HGB 15.1 (H) 05/19/2018   HCT 45.9 05/19/2018   MCV 92.4 05/19/2018   PLT 252 05/19/2018      Component Value Date/Time   NA 142 09/28/2018 1556   K 4.4 09/28/2018 1556   CL 102 09/28/2018 1556   CO2 20 09/28/2018 1556   GLUCOSE 102 (H) 09/28/2018 1556   GLUCOSE 104 (H) 05/19/2018 1455   BUN 12 09/28/2018 1556   CREATININE 0.68 09/28/2018 1556   CREATININE 0.62 12/18/2017 1035   CALCIUM 9.5 09/28/2018 1556   PROT 7.4 09/28/2018 1556   ALBUMIN 4.2 09/28/2018 1556   AST 32 09/28/2018 1556   ALT 36 (H) 09/28/2018 1556   ALKPHOS 70 09/28/2018 1556   BILITOT 0.4 09/28/2018 1556   GFRNONAA 95 09/28/2018 1556   GFRAA 109 09/28/2018 1556   Lab Results  Component Value Date   CHOL 171 09/28/2018   HDL 56 09/28/2018   LDLCALC 93 09/28/2018   TRIG 108 09/28/2018   CHOLHDL 4 09/07/2017   Lab Results  Component Value Date   HGBA1C 6.1 (H) 09/28/2018   No results found for: VITAMINB12 Lab Results  Component Value Date   TSH 2.960 09/28/2018      ASSESSMENT AND PLAN 63 y.o. year old female  has a past medical history of Asthma, Gout, Headache, Hypertension, Joint pain, Pre-diabetes, Seasonal allergies, and Sleep apnea. here with:  1. Obstructive sleep apnea on CPAP  Patient CPAP download shows excellent compliance and good treatment of her apnea.  She is encouraged to continue using CPAP nightly and greater than 4 hours each night.  She is advised that if her symptoms worsen or she develops new symptoms she should  let us know.  She will follow-up in 1 year or sooner if needed   I spent 15 minutes with the patient. 50% of this time was spent reviewing CPAP download   Ward Givens, MSN, NP-C 06/23/2019, 3:50 PM [facility] [address], [place], [place] [address] [phone]

## 2019-06-23 NOTE — Patient Instructions (Signed)
Continue using CPAP nightly and greater than 4 hours each night °If your symptoms worsen or you develop new symptoms please let us know.  ° °

## 2019-07-04 ENCOUNTER — Ambulatory Visit
Admission: RE | Admit: 2019-07-04 | Discharge: 2019-07-04 | Disposition: A | Discharge status: Home / Self Care | Payer: Self-pay | Source: Ambulatory Visit | Attending: Orthopedic Surgery | Admitting: Orthopedic Surgery

## 2019-07-04 ENCOUNTER — Other Ambulatory Visit: Payer: Self-pay

## 2019-07-04 DIAGNOSIS — M1711 Unilateral primary osteoarthritis, right knee: Secondary | ICD-10-CM | POA: Diagnosis not present

## 2019-07-04 DIAGNOSIS — G8929 Other chronic pain: Secondary | ICD-10-CM

## 2019-07-04 DIAGNOSIS — M25561 Pain in right knee: Secondary | ICD-10-CM

## 2019-07-04 DIAGNOSIS — M25461 Effusion, right knee: Secondary | ICD-10-CM | POA: Diagnosis not present

## 2019-07-04 MED ORDER — GADOBENATE DIMEGLUMINE 529 MG/ML IV SOLN
20.0000 mL | Freq: Once | INTRAVENOUS | Status: AC | PRN
  Administered 2019-07-04: 20 mL via INTRAVENOUS

## 2019-07-07 DIAGNOSIS — M1711 Unilateral primary osteoarthritis, right knee: Secondary | ICD-10-CM | POA: Diagnosis not present

## 2019-07-08 ENCOUNTER — Other Ambulatory Visit: Payer: Self-pay

## 2019-07-08 ENCOUNTER — Encounter: Payer: Self-pay | Admitting: Podiatry

## 2019-07-08 ENCOUNTER — Ambulatory Visit (INDEPENDENT_AMBULATORY_CARE_PROVIDER_SITE_OTHER): Payer: BC Managed Care – PPO

## 2019-07-08 ENCOUNTER — Ambulatory Visit: Payer: BC Managed Care – PPO | Admitting: Podiatry

## 2019-07-08 DIAGNOSIS — M109 Gout, unspecified: Secondary | ICD-10-CM

## 2019-07-08 DIAGNOSIS — M84375A Stress fracture, left foot, initial encounter for fracture: Secondary | ICD-10-CM

## 2019-07-08 MED ORDER — METHYLPREDNISOLONE 4 MG PO TBPK: ORAL_TABLET | ORAL | 0 refills | Status: DC

## 2019-07-08 MED ORDER — ALLOPURINOL 100 MG PO TABS: 200.0000 mg | ORAL_TABLET | Freq: Every day | ORAL | 6 refills | Status: DC

## 2019-07-08 NOTE — Progress Notes (Signed)
Subjective: 63 year old female presents the office with concerns of gout the left foot.  She states on Monday she started to notice the onset of symptoms and by Wednesday she was having swelling and redness on the big toe joint also to the top of the foot.  She denies any recent injury or trauma.  No change in activity level.  She is on allopurinol 100 mg. Denies any systemic complaints such as fevers, chills, nausea, vomiting. No acute changes since last appointment, and no other complaints at this time.   Objective: AAO x3, NAD DP/PT pulses palpable bilaterally, CRT less than 3 seconds There is localized edema and erythema along the first MPJ in the left foot and this is with majority tenderness is localized however the edema and erythema does extend towards the second MPJ as well.  There is no fluctuation or crepitation.  There is tenderness along this area.  No pain with calf compression, swelling, warmth, erythema  Assessment: Likely gout left foot however concern for stress fracture second metatarsal  Plan: -All treatment options discussed with the patient including all alternatives, risks, complications.  -Symptoms more correspond with gout.  Prescribed a Medrol Dosepak.  Also increase allopurinol to 200 mg daily.  Refer to rheumatology. -X-rays were obtained and reviewed.  There are some radiolucency present in second metatarsal neck concerning for possible stress fracture but this could also represent edema.  I placed her into a surgical shoe today.  Elevation. -Follow-up in 2 weeks for repeat x-rays of the left foot or sooner if any issues are to arise. -Patient encouraged to call the office with any questions, concerns, change in symptoms.   Trula Slade DPM

## 2019-07-11 ENCOUNTER — Ambulatory Visit: Payer: BC Managed Care – PPO | Admitting: Podiatry

## 2019-07-11 ENCOUNTER — Telehealth: Payer: Self-pay | Admitting: *Deleted

## 2019-07-11 DIAGNOSIS — M109 Gout, unspecified: Secondary | ICD-10-CM

## 2019-07-11 NOTE — Telephone Encounter (Signed)
-----   Message from Trula Slade, DPM sent at 07/08/2019  3:37 PM EDT ----- Can you please put in for a rheumatology consult for gout (Dr. Lenna Gilford office please)

## 2019-07-11 NOTE — Telephone Encounter (Signed)
Faxed required form, clinicals for 6 months, demographics to [facility].

## 2019-07-23 ENCOUNTER — Ambulatory Visit (INDEPENDENT_AMBULATORY_CARE_PROVIDER_SITE_OTHER): Payer: BC Managed Care – PPO

## 2019-07-23 ENCOUNTER — Other Ambulatory Visit: Payer: Self-pay

## 2019-07-23 ENCOUNTER — Encounter: Payer: Self-pay | Admitting: Podiatry

## 2019-07-23 ENCOUNTER — Ambulatory Visit: Payer: BC Managed Care – PPO | Admitting: Podiatry

## 2019-07-23 DIAGNOSIS — M84375A Stress fracture, left foot, initial encounter for fracture: Secondary | ICD-10-CM

## 2019-07-23 DIAGNOSIS — M109 Gout, unspecified: Secondary | ICD-10-CM

## 2019-07-23 DIAGNOSIS — M779 Enthesopathy, unspecified: Secondary | ICD-10-CM

## 2019-07-23 DIAGNOSIS — M84375D Stress fracture, left foot, subsequent encounter for fracture with routine healing: Secondary | ICD-10-CM | POA: Diagnosis not present

## 2019-07-23 MED ORDER — ALLOPURINOL 300 MG PO TABS: 300.0000 mg | ORAL_TABLET | Freq: Every day | ORAL | 0 refills | Status: DC

## 2019-07-23 MED ORDER — COLCHICINE 0.6 MG PO TABS: 0.6000 mg | ORAL_TABLET | Freq: Every day | ORAL | 0 refills | Status: DC

## 2019-07-23 NOTE — Progress Notes (Signed)
Subjective: 63 year old female with history of gout, prediabetes, OSA, HTN, Vitamin D deficiency and previous bariatric surgery presents to the office today for concern of bilateral foot pain. She states that the pain has worsened. Her husband underwent knee replacement yesterday and she could not get around the hospital due to the pain.  She states that her left foot is been feeling better however she noticed increased swelling and pain to the right first MPJ.  She has a appointment to see the rheumatologist, Dr. Lenna Gilford on December 11.  She is concerned that she may concern develop some arthritis in her hands as well for possible gout.  No recent injury or falls.    Denies any systemic complaints such as fevers, chills, nausea, vomiting. No acute changes since last appointment, and no other complaints at this time.   Objective: AAO x3, NAD DP/PT pulses palpable bilaterally, CRT less than 3 seconds Mild edema to left foot on the first MPJ but there is no erythema or warmth.  Overall he is feeling much better.  There is edema and erythema mostly to the right first MPJ also some swelling to the dorsal forefoot on the right side.  There is no other areas of pinpoint tenderness.  No pain with calf compression, swelling, warmth, erythema  Assessment: Gout  Plan: -All treatment options discussed with the patient including all alternatives, risks, complications.  -X-rays obtained reviewed bilaterally.  No evidence of acute fracture or stress fracture today. -Steroid injection performed to the right first MPJ today.  See procedure note below. -We will increase allopurinol 300 mg daily.  Discussed side effects of medication. -Colchicine for acute flare. -Follow-up with Dr. Lenna Gilford or she can see me if needed prior to this. -Patient encouraged to call the office with any questions, concerns, change in symptoms.   Procedure: Injection Small Joint Discussed alternatives, risks, complications and verbal  consent was obtained.  Location: Right 1st MTPJ Skin Prep: Betadine  Injectate: 0.5cc 0.5% marcaine plain, 0.5 cc 2% lidocaine plain and, 1 cc kenalog 10. Disposition: Patient tolerated procedure well. Injection site dressed with a band-aid.  Post-injection care was discussed and return precautions discussed.   Trula Slade DPM

## 2019-07-25 ENCOUNTER — Ambulatory Visit: Payer: BC Managed Care – PPO | Admitting: Podiatry

## 2019-08-12 DIAGNOSIS — I10 Essential (primary) hypertension: Secondary | ICD-10-CM | POA: Diagnosis not present

## 2019-08-12 DIAGNOSIS — G2581 Restless legs syndrome: Secondary | ICD-10-CM | POA: Diagnosis not present

## 2019-08-12 DIAGNOSIS — M109 Gout, unspecified: Secondary | ICD-10-CM | POA: Diagnosis not present

## 2019-08-19 DIAGNOSIS — M1009 Idiopathic gout, multiple sites: Secondary | ICD-10-CM | POA: Diagnosis not present

## 2019-08-19 DIAGNOSIS — Z79899 Other long term (current) drug therapy: Secondary | ICD-10-CM | POA: Diagnosis not present

## 2019-08-19 DIAGNOSIS — M150 Primary generalized (osteo)arthritis: Secondary | ICD-10-CM | POA: Diagnosis not present

## 2019-09-12 ENCOUNTER — Ambulatory Visit: Payer: BC Managed Care – PPO | Attending: Internal Medicine

## 2019-09-12 DIAGNOSIS — Z20822 Contact with and (suspected) exposure to covid-19: Secondary | ICD-10-CM

## 2019-09-13 LAB — NOVEL CORONAVIRUS, NAA: SARS-CoV-2, NAA: NOT DETECTED

## 2019-10-03 DIAGNOSIS — G2581 Restless legs syndrome: Secondary | ICD-10-CM | POA: Diagnosis not present

## 2019-10-25 ENCOUNTER — Telehealth: Payer: Self-pay | Admitting: *Deleted

## 2019-10-25 ENCOUNTER — Other Ambulatory Visit: Payer: Self-pay | Admitting: Podiatry

## 2019-10-25 MED ORDER — ALLOPURINOL 300 MG PO TABS: 300.0000 mg | ORAL_TABLET | Freq: Every day | ORAL | 3 refills | Status: AC

## 2019-10-25 NOTE — Telephone Encounter (Signed)
Called patient back and left a message staing that Dr Jacqualyn Posey refilled the allopurinol 300 mg and sent over to the pharmacy [facility] [address] [facility]. Lattie Haw

## 2019-11-04 DIAGNOSIS — G4733 Obstructive sleep apnea (adult) (pediatric): Secondary | ICD-10-CM | POA: Diagnosis not present

## 2019-12-01 ENCOUNTER — Ambulatory Visit: Payer: BC Managed Care – PPO | Admitting: Podiatry

## 2019-12-16 DIAGNOSIS — Z23 Encounter for immunization: Secondary | ICD-10-CM | POA: Diagnosis not present

## 2020-01-13 DIAGNOSIS — Z23 Encounter for immunization: Secondary | ICD-10-CM | POA: Diagnosis not present

## 2020-01-27 DIAGNOSIS — M109 Gout, unspecified: Secondary | ICD-10-CM | POA: Diagnosis not present

## 2020-01-27 DIAGNOSIS — Z13220 Encounter for screening for lipoid disorders: Secondary | ICD-10-CM | POA: Diagnosis not present

## 2020-01-27 DIAGNOSIS — Z0000 Encounter for general adult medical examination without abnormal findings: Secondary | ICD-10-CM | POA: Diagnosis not present

## 2020-01-31 DIAGNOSIS — I10 Essential (primary) hypertension: Secondary | ICD-10-CM | POA: Diagnosis not present

## 2020-01-31 DIAGNOSIS — Z0000 Encounter for general adult medical examination without abnormal findings: Secondary | ICD-10-CM | POA: Diagnosis not present

## 2020-01-31 DIAGNOSIS — M109 Gout, unspecified: Secondary | ICD-10-CM | POA: Diagnosis not present

## 2020-01-31 DIAGNOSIS — F329 Major depressive disorder, single episode, unspecified: Secondary | ICD-10-CM | POA: Diagnosis not present

## 2020-01-31 DIAGNOSIS — E538 Deficiency of other specified B group vitamins: Secondary | ICD-10-CM | POA: Diagnosis not present

## 2020-01-31 DIAGNOSIS — G2581 Restless legs syndrome: Secondary | ICD-10-CM | POA: Diagnosis not present

## 2020-02-07 DIAGNOSIS — D510 Vitamin B12 deficiency anemia due to intrinsic factor deficiency: Secondary | ICD-10-CM | POA: Diagnosis not present

## 2020-02-14 DIAGNOSIS — E538 Deficiency of other specified B group vitamins: Secondary | ICD-10-CM | POA: Diagnosis not present

## 2020-02-21 DIAGNOSIS — D510 Vitamin B12 deficiency anemia due to intrinsic factor deficiency: Secondary | ICD-10-CM | POA: Diagnosis not present

## 2020-03-22 DIAGNOSIS — D510 Vitamin B12 deficiency anemia due to intrinsic factor deficiency: Secondary | ICD-10-CM | POA: Diagnosis not present

## 2020-04-27 DIAGNOSIS — E559 Vitamin D deficiency, unspecified: Secondary | ICD-10-CM | POA: Diagnosis not present

## 2020-04-27 DIAGNOSIS — E538 Deficiency of other specified B group vitamins: Secondary | ICD-10-CM | POA: Diagnosis not present

## 2020-05-04 DIAGNOSIS — E538 Deficiency of other specified B group vitamins: Secondary | ICD-10-CM | POA: Diagnosis not present

## 2020-05-04 DIAGNOSIS — F329 Major depressive disorder, single episode, unspecified: Secondary | ICD-10-CM | POA: Diagnosis not present

## 2020-05-04 DIAGNOSIS — E559 Vitamin D deficiency, unspecified: Secondary | ICD-10-CM | POA: Diagnosis not present

## 2020-05-04 DIAGNOSIS — I10 Essential (primary) hypertension: Secondary | ICD-10-CM | POA: Diagnosis not present

## 2020-05-16 DIAGNOSIS — K9509 Other complications of gastric band procedure: Secondary | ICD-10-CM | POA: Diagnosis not present

## 2020-06-15 ENCOUNTER — Emergency Department (HOSPITAL_COMMUNITY): Payer: BC Managed Care – PPO

## 2020-06-15 ENCOUNTER — Encounter (HOSPITAL_COMMUNITY): Payer: Self-pay | Admitting: Emergency Medicine

## 2020-06-15 ENCOUNTER — Emergency Department (HOSPITAL_COMMUNITY)
Admission: EM | Admit: 2020-06-15 | Discharge: 2020-06-15 | Disposition: A | Discharge status: Home / Self Care | Payer: BC Managed Care – PPO | Attending: Emergency Medicine | Admitting: Emergency Medicine

## 2020-06-15 DIAGNOSIS — Z9884 Bariatric surgery status: Secondary | ICD-10-CM | POA: Diagnosis not present

## 2020-06-15 DIAGNOSIS — Z79899 Other long term (current) drug therapy: Secondary | ICD-10-CM | POA: Insufficient documentation

## 2020-06-15 DIAGNOSIS — K429 Umbilical hernia without obstruction or gangrene: Secondary | ICD-10-CM

## 2020-06-15 DIAGNOSIS — I10 Essential (primary) hypertension: Secondary | ICD-10-CM | POA: Diagnosis not present

## 2020-06-15 DIAGNOSIS — K9509 Other complications of gastric band procedure: Secondary | ICD-10-CM | POA: Diagnosis not present

## 2020-06-15 DIAGNOSIS — R109 Unspecified abdominal pain: Secondary | ICD-10-CM | POA: Diagnosis not present

## 2020-06-15 DIAGNOSIS — K436 Other and unspecified ventral hernia with obstruction, without gangrene: Secondary | ICD-10-CM | POA: Diagnosis not present

## 2020-06-15 DIAGNOSIS — J45909 Unspecified asthma, uncomplicated: Secondary | ICD-10-CM | POA: Diagnosis not present

## 2020-06-15 HISTORY — DX: Umbilical hernia without obstruction or gangrene: K42.9

## 2020-06-15 LAB — CBC WITH DIFFERENTIAL/PLATELET
Abs Immature Granulocytes: 0.02 10*3/uL (ref 0.00–0.07)
Basophils Absolute: 0 10*3/uL (ref 0.0–0.1)
Basophils Relative: 0 %
Eosinophils Absolute: 0.2 10*3/uL (ref 0.0–0.5)
Eosinophils Relative: 3 %
HCT: 44.9 % (ref 36.0–46.0)
Hemoglobin: 14.9 g/dL (ref 12.0–15.0)
Immature Granulocytes: 0 %
Lymphocytes Relative: 32 %
Lymphs Abs: 2.2 10*3/uL (ref 0.7–4.0)
MCH: 30.9 pg (ref 26.0–34.0)
MCHC: 33.2 g/dL (ref 30.0–36.0)
MCV: 93.2 fL (ref 80.0–100.0)
Monocytes Absolute: 0.6 10*3/uL (ref 0.1–1.0)
Monocytes Relative: 8 %
Neutro Abs: 3.9 10*3/uL (ref 1.7–7.7)
Neutrophils Relative %: 57 %
Platelets: 230 10*3/uL (ref 150–400)
RBC: 4.82 MIL/uL (ref 3.87–5.11)
RDW: 13.6 % (ref 11.5–15.5)
WBC: 7 10*3/uL (ref 4.0–10.5)
nRBC: 0 % (ref 0.0–0.2)

## 2020-06-15 LAB — COMPREHENSIVE METABOLIC PANEL
ALT: 29 U/L (ref 0–44)
AST: 24 U/L (ref 15–41)
Albumin: 4 g/dL (ref 3.5–5.0)
Alkaline Phosphatase: 66 U/L (ref 38–126)
Anion gap: 11 (ref 5–15)
BUN: 17 mg/dL (ref 8–23)
CO2: 27 mmol/L (ref 22–32)
Calcium: 9.3 mg/dL (ref 8.9–10.3)
Chloride: 103 mmol/L (ref 98–111)
Creatinine, Ser: 0.63 mg/dL (ref 0.44–1.00)
GFR, Estimated: >60 mL/min (ref >60)
Glucose, Bld: 99 mg/dL (ref 70–99)
Potassium: 3.5 mmol/L (ref 3.5–5.1)
Sodium: 141 mmol/L (ref 135–145)
Total Bilirubin: 0.5 mg/dL (ref 0.3–1.2)
Total Protein: 7.3 g/dL (ref 6.5–8.1)

## 2020-06-15 LAB — LIPASE, BLOOD: Lipase: 38 U/L (ref 11–51)

## 2020-06-15 MED ORDER — ONDANSETRON HCL 4 MG/2ML IJ SOLN
4.0000 mg | Freq: Once | INTRAMUSCULAR | Status: AC
  Administered 2020-06-15: 4 mg via INTRAVENOUS
  Filled 2020-06-15: qty 2

## 2020-06-15 MED ORDER — OXYCODONE-ACETAMINOPHEN 5-325 MG PO TABS
1.0000 | ORAL_TABLET | Freq: Once | ORAL | Status: DC
  Filled 2020-06-15: qty 1

## 2020-06-15 MED ORDER — OXYCODONE-ACETAMINOPHEN 5-325 MG PO TABS: 1.0000 | ORAL_TABLET | Freq: Four times a day (QID) | ORAL | 0 refills | Status: DC | PRN

## 2020-06-15 MED ORDER — MORPHINE SULFATE (PF) 4 MG/ML IV SOLN
4.0000 mg | Freq: Once | INTRAVENOUS | Status: AC
  Administered 2020-06-15: 4 mg via INTRAVENOUS
  Filled 2020-06-15: qty 1

## 2020-06-15 MED ORDER — IOHEXOL 300 MG/ML  SOLN
100.0000 mL | Freq: Once | INTRAMUSCULAR | Status: AC | PRN
  Administered 2020-06-15: 100 mL via INTRAVENOUS

## 2020-06-15 NOTE — ED Triage Notes (Signed)
Per pt, sent by [facility] for possible incarcerated hernia-umbilical pain since Wednesday-states having explosive watery stools

## 2020-06-15 NOTE — Discharge Instructions (Addendum)
As we discussed, your work-up today was reassuring.  Your CT scan showed that the hernia contains fat which is good.  It is mainly about pain control at this point.  The hernia does not contain any bowel or any signs of obstruction.  We will leave the hernia as it is until you follow-up with Dr. Hassell Done to get it repaired in surgery.  We will plan to send you home with pain medication.  As we discussed, if you have not heard from him, please call his office.  Closely monitor your symptoms.  Return to the emergency department for any worsening pain, fever, vomiting, redness or swelling of the stomach or any other worsening concerning symptoms.

## 2020-06-15 NOTE — ED Provider Notes (Signed)
[facility] DEPT Provider Note   CSN: [id number] Arrival date & time: 06/15/20  1728     History Chief Complaint  Patient presents with  . umbilical pain    Gabrielle Moore is a 64 y.o. female possible history of hypertension, umbilical hernia who presents for evaluation of abdominal pain.  She reports that about 2 days ago, she was having a bowel movement and she had to strain very hard.  When she did not, it caused her umbilical hernia to pop out.  She reports that she was having severe pain associated with it.  She states is never happened before.  She called her primary care doctor who she was going to see yesterday but there were able to have her follow-up with her surgeon today.  She saw him at the office and he was unable to reduce the hernia and sent her over to the ED for further evaluation.  She states she has had some nausea but denies any vomiting.  She does report that she has had decreased appetite.  The pain has been consistent around the periumbilical region does not radiate.  She reports she has had diarrhea.  No blood in stool.  Denies any fevers, chest pain, difficulty breathing.  She still able to urinate without any difficulty.  The history is provided by the patient.       Past Medical History:  Diagnosis Date  . Asthma    as a child  . Gout   . Headache   . Hernia, umbilical   . Hypertension   . Joint pain   . Pre-diabetes    "LABELED AS PRE" -- TAKES NO MEDS  . Seasonal allergies   . Sleep apnea     Patient Active Problem List   Diagnosis Date Noted  . Vitamin D deficiency 10/27/2018  . Prediabetes 10/13/2018  . Class 3 severe obesity with serious comorbidity and body mass index (BMI) of 40.0 to 44.9 in adult [facility]) 09/30/2018  . Status post bariatric surgery 06/16/2018  . Sinusitis, chronic 05/26/2018  . Chronic ethmoidal sinusitis 03/31/2018  . Chronic nonintractable headache 03/31/2018  . OSA on CPAP 06/16/2016  .  Snoring 05/02/2013  . Morbid obesity ([place]) 07/19/2012  . Hypertension 07/19/2012  . History of laparoscopic adjustable gastric banding, APS, 01/25/2008. 11/14/2011    Past Surgical History:  Procedure Laterality Date  . BREAST SURGERY  1998   breast reduction  . CESAREAN SECTION  1996  . LAPAROSCOPIC GASTRIC BANDING  2009  . LAPAROSCOPIC GASTRIC BANDING  2009  . SHOULDER SURGERY    . SINUS ENDO W/FUSION Left 05/26/2018   Procedure: ENDOSCOPIC SINUS SURGERY WITH NAVIGATION;  Surgeon: Jerrell Belfast, MD;  Location: [facility];  Service: ENT;  Laterality: Left;  . TONSILLECTOMY AND ADENOIDECTOMY    . TURBINATE REDUCTION Bilateral 05/26/2018   Procedure: TURBINATE REDUCTION;  Surgeon: Jerrell Belfast, MD;  Location: [facility];  Service: ENT;  Laterality: Bilateral;     OB History    Gravida  1   Para  1   Term      Preterm      AB      Living        SAB      TAB      Ectopic      Multiple      Live Births              Family History  Problem Relation Age of Onset  .  Hyperlipidemia Mother   . Hypertension Mother   . Hyperlipidemia Father   . Hypertension Father   . Heart disease Father 62  . Heart attack Father   . Depression Father   . Stroke Father   . Heart disease Other        parent     Social History   Tobacco Use  . Smoking status: Never Smoker  . Smokeless tobacco: Never Used  Vaping Use  . Vaping Use: Never used  Substance Use Topics  . Alcohol use: No  . Drug use: No    Home Medications Prior to Admission medications   Medication Sig Start Date End Date Taking? Authorizing Provider  allopurinol (ZYLOPRIM) 300 MG tablet Take 1 tablet (300 mg total) by mouth daily. 10/25/19   Trula Slade, DPM  colchicine 0.6 MG tablet Take 1 tablet (0.6 mg total) by mouth daily. 07/23/19   Trula Slade, DPM  ibuprofen (ADVIL,MOTRIN) 200 MG tablet Take 200 mg by mouth as needed.    [provider]  loratadine (CLARITIN) 10 MG tablet Take  10 mg by mouth daily.    [provider]  methylPREDNISolone (MEDROL DOSEPAK) 4 MG TBPK tablet Take as directed 07/08/19   Trula Slade, DPM  oxyCODONE-acetaminophen (PERCOCET/ROXICET) 5-325 MG tablet Take 1-2 tablets by mouth every 6 (six) hours as needed for severe pain. 06/15/20   Volanda Napoleon, PA-C  valsartan-hydrochlorothiazide (DIOVAN-HCT) 80-12.5 MG tablet TAKE 1 TABLET BY MOUTH DAILY 07/07/18   Lucretia Kern, DO    Allergies    Codeine and Keflex [cephalexin]  Review of Systems   Review of Systems  Constitutional: Positive for appetite change. Negative for fever.  Respiratory: Negative for cough and shortness of breath.   Cardiovascular: Negative for chest pain.  Gastrointestinal: Positive for abdominal pain, diarrhea and nausea. Negative for vomiting.  Genitourinary: Negative for dysuria and hematuria.  Neurological: Negative for headaches.  All other systems reviewed and are negative.   Physical Exam Updated Vital Signs BP 119/71   Pulse 65   Temp 98.5 F (36.9 C) (Oral)   Resp 15   SpO2 94%   Physical Exam Vitals and nursing note reviewed.  Constitutional:      Appearance: Normal appearance. She is well-developed.     Comments: Appears uncomfortable but no acute distress   HENT:     Head: Normocephalic and atraumatic.  Eyes:     General: Lids are normal.     Conjunctiva/sclera: Conjunctivae normal.     Pupils: Pupils are equal, round, and reactive to light.  Cardiovascular:     Rate and Rhythm: Normal rate and regular rhythm.     Pulses: Normal pulses.     Heart sounds: Normal heart sounds. No murmur heard.  No friction rub. No gallop.   Pulmonary:     Effort: Pulmonary effort is normal.     Breath sounds: Normal breath sounds.  Abdominal:     Palpations: Abdomen is soft. Abdomen is not rigid.     Tenderness: There is no abdominal tenderness. There is no guarding.     Comments: Golf ball sized umbilical hernia with tenderness palpation  noted area.  Unable to reduce.  No rigidity, guarding.  Musculoskeletal:        General: Normal range of motion.     Cervical back: Full passive range of motion without pain.  Skin:    General: Skin is warm and dry.     Capillary Refill: Capillary  refill takes less than 2 seconds.  Neurological:     Mental Status: She is alert and oriented to person, place, and time.  Psychiatric:        Speech: Speech normal.     ED Results / Procedures / Treatments   Labs (all labs ordered are listed, but only abnormal results are displayed) Labs Reviewed  COMPREHENSIVE METABOLIC PANEL  LIPASE, BLOOD  CBC WITH DIFFERENTIAL/PLATELET  CBC WITH DIFFERENTIAL/PLATELET    EKG None  Radiology CT ABDOMEN PELVIS W CONTRAST  Result Date: 06/15/2020 CLINICAL DATA:  Abdomen pain watery stool EXAM: CT ABDOMEN AND PELVIS WITH CONTRAST TECHNIQUE: Multidetector CT imaging of the abdomen and pelvis was performed using the standard protocol following bolus administration of intravenous contrast. CONTRAST:  166mL OMNIPAQUE IOHEXOL 300 MG/ML  SOLN COMPARISON:  None. FINDINGS: Lower chest: Lung bases demonstrate linear atelectasis. Borderline cardiomegaly. Hepatobiliary: No focal liver abnormality is seen. No gallstones, gallbladder wall thickening, or biliary dilatation. Pancreas: Unremarkable. No pancreatic ductal dilatation or surrounding inflammatory changes. Spleen: Normal in size without focal abnormality. Adrenals/Urinary Tract: Adrenal glands are unremarkable. Kidneys are normal, without renal calculi, focal lesion, or hydronephrosis. Bladder is unremarkable. Stomach/Bowel: Status post laparoscopic gastric banding with tubing in the anterior abdomen and reservoir or in the subcutaneous fat of the right anterior abdominal wall. No dilated small bowel. No bowel wall thickening. Negative appendix. Vascular/Lymphatic: No significant vascular findings are present. No enlarged abdominal or pelvic lymph nodes.  Reproductive: Uterus and bilateral adnexa are unremarkable. Other: No free air or significant free fluid. Moderate fat containing umbilical hernia without significant edema or fluid in the sac. No bowel containing ventral hernia. Musculoskeletal: No acute or significant osseous findings. IMPRESSION: 1. No CT evidence for acute intra-abdominal or pelvic abnormality. 2. Moderate fat containing umbilical hernia without inflammatory change, fluid or other suspicious features. 3. Status post laparoscopic gastric banding. Negative for a bowel obstruction. Electronically Signed   By: Donavan Foil M.D.   On: 06/15/2020 19:34    Procedures Procedures (including critical care time)  Medications Ordered in ED Medications  oxyCODONE-acetaminophen (PERCOCET/ROXICET) 5-325 MG per tablet 1 tablet (1 tablet Oral Refused 06/15/20 2157)  ondansetron (ZOFRAN) injection 4 mg (4 mg Intravenous Given 06/15/20 1819)  morphine 4 MG/ML injection 4 mg (4 mg Intravenous Given 06/15/20 1821)  iohexol (OMNIPAQUE) 300 MG/ML solution 100 mL (100 mLs Intravenous Contrast Given 06/15/20 1912)    ED Course  I have reviewed the triage vital signs and the nursing notes.  Pertinent labs & imaging results that were available during my care of the patient were reviewed by me and considered in my medical decision making (see chart for details).    MDM Rules/Calculators/A&P                          64 year old female who presents for evaluation of abdominal pain x2 days.  With straining to have a bowel movement and reports that her hernia came out and she has not been able to put it back in.  Saw surgery today who referred her to the emergency department for further evaluation.  She has had diarrhea, but no vomiting.  On initial arrival, she is afebrile nontoxic-appearing.  Vital signs are stable.  On exam, she has a golf ball sized periumbilical hernia that I cannot reduce.  Plan for labs, CT.  I still discussed with Dr. Hassell Done  (general surgery).  He will let Dr. Dema Severin know.  CMP is  unremarkable. Lipase normal.   CT scan shows a moderate fat-containing umbilical hernia without inflammatory change fluid or other suspicious features.  No evidence of bowel obstruction.  No other acute intra-abdominal or pelvic abnormality.  Discussed with Dr. Dema Severin (general surgery).  Patient can follow-up with Dr. Hassell Done.  He is planning to remove her lap band and he will fix the hernia during that surgery.   Reevaluation.  Patient is resting comfortably on bed.  Hernia is still there but patient reports that her pain is improved.  Dr. Dema Severin stated that patient can be discharged with hernia still in place given that the fat is blocking from any bowel being obstructed.  We will give her short course of pain medication.  I discussed with patient that she should closely monitor symptoms and if she has any worsening pain, fever, she is post return the emergency department immediately.  Otherwise, she can follow-up with Dr. Hassell Done. At this time, patient exhibits no emergent life-threatening condition that require further evaluation in ED. Patient had ample opportunity for questions and discussion. All patient's questions were answered with full understanding. Strict return precautions discussed. Patient expresses understanding and agreement to plan.   Portions of this note were generated with [employer]. Dictation errors may occur despite best attempts at proofreading.  Final Clinical Impression(s) / ED Diagnoses Final diagnoses:  Umbilical hernia without obstruction and without gangrene    Rx / DC Orders ED Discharge Orders         Ordered    oxyCODONE-acetaminophen (PERCOCET/ROXICET) 5-325 MG tablet  Every 6 hours PRN        06/15/20 2156           Volanda Napoleon, PA-C 06/15/20 2210    Malvin Johns, MD 06/15/20 2320

## 2020-06-20 ENCOUNTER — Encounter: Payer: Self-pay | Admitting: Adult Health

## 2020-06-25 ENCOUNTER — Other Ambulatory Visit: Payer: Self-pay

## 2020-06-25 ENCOUNTER — Encounter: Payer: Self-pay | Admitting: Adult Health

## 2020-06-25 ENCOUNTER — Ambulatory Visit: Payer: BC Managed Care – PPO | Admitting: Adult Health

## 2020-06-25 VITALS — BP 127/76 | HR 66 | Ht 66.0 in | Wt 252.8 lb

## 2020-06-25 DIAGNOSIS — Z9989 Dependence on other enabling machines and devices: Secondary | ICD-10-CM | POA: Diagnosis not present

## 2020-06-25 DIAGNOSIS — G4733 Obstructive sleep apnea (adult) (pediatric): Secondary | ICD-10-CM

## 2020-06-25 NOTE — Patient Instructions (Signed)
Continue using CPAP nightly and greater than 4 hours each night °If your symptoms worsen or you develop new symptoms please let us know.  ° °

## 2020-06-25 NOTE — Progress Notes (Signed)
PATIENT: Gabrielle Moore DOB: [birth year]  REASON FOR VISIT: follow up HISTORY FROM: patient  HISTORY OF PRESENT ILLNESS: Today 06/25/20:  Gabrielle Moore is a 64 year old female with a history of obstructive sleep apnea on CPAP. Her download indicates that she use her machine 28 out of 30 days for compliance of 93%. She used her machine greater than 4 hours 25 out of 30 days for compliance of 83%. On average she uses her machine 6 hours and 19 minutes. Her residual AHI is 0.3 on 6 to 12 cm of water with EPR of 3. Leak in the 95th percentile is 16.9 L/min. She returns today for follow-up.   REVIEW OF SYSTEMS: Out of a complete 14 system review of symptoms, the patient complains only of the following symptoms, and all other reviewed systems are negative.  See hpi  ALLERGIES: Allergies  Allergen Reactions  . Codeine Nausea Only  . Keflex [Cephalexin]     ? Reaction     HOME MEDICATIONS: Outpatient Medications Prior to Visit  Medication Sig Dispense Refill  . allopurinol (ZYLOPRIM) 300 MG tablet Take 1 tablet (300 mg total) by mouth daily. 30 tablet 3  . colchicine 0.6 MG tablet Take 1 tablet (0.6 mg total) by mouth daily. 10 tablet 0  . ibuprofen (ADVIL,MOTRIN) 200 MG tablet Take 200 mg by mouth as needed.    . loratadine (CLARITIN) 10 MG tablet Take 10 mg by mouth daily.    . valsartan-hydrochlorothiazide (DIOVAN-HCT) 80-12.5 MG tablet TAKE 1 TABLET BY MOUTH DAILY 90 tablet 0  . methylPREDNISolone (MEDROL DOSEPAK) 4 MG TBPK tablet Take as directed 21 tablet 0  . oxyCODONE-acetaminophen (PERCOCET/ROXICET) 5-325 MG tablet Take 1-2 tablets by mouth every 6 (six) hours as needed for severe pain. 10 tablet 0   No facility-administered medications prior to visit.    PAST MEDICAL HISTORY: Past Medical History:  Diagnosis Date  . Asthma    as a child  . Gout   . Headache   . Hernia, umbilical   . Hypertension   . Joint pain   . Pre-diabetes    "LABELED AS PRE" -- TAKES NO  MEDS  . Seasonal allergies   . Sleep apnea     PAST SURGICAL HISTORY: Past Surgical History:  Procedure Laterality Date  . BREAST SURGERY  1998   breast reduction  . CESAREAN SECTION  1996  . LAPAROSCOPIC GASTRIC BANDING  2009  . LAPAROSCOPIC GASTRIC BANDING  2009  . SHOULDER SURGERY    . SINUS ENDO W/FUSION Left 05/26/2018   Procedure: ENDOSCOPIC SINUS SURGERY WITH NAVIGATION;  Surgeon: Jerrell Belfast, MD;  Location: [facility];  Service: ENT;  Laterality: Left;  . TONSILLECTOMY AND ADENOIDECTOMY    . TURBINATE REDUCTION Bilateral 05/26/2018   Procedure: TURBINATE REDUCTION;  Surgeon: Jerrell Belfast, MD;  Location: [facility];  Service: ENT;  Laterality: Bilateral;    FAMILY HISTORY: Family History  Problem Relation Age of Onset  . Hyperlipidemia Mother   . Hypertension Mother   . Hyperlipidemia Father   . Hypertension Father   . Heart disease Father 38  . Heart attack Father   . Depression Father   . Stroke Father   . Heart disease Other        parent     SOCIAL HISTORY: Social History   Socioeconomic History  . Marital status: Married    Spouse name: Jori Moll  . Number of children: Not on file  . Years of education: Not on file  .  Highest education level: Not on file  Occupational History  . 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[employer]  Tobacco Use  . Smoking status: Never Smoker  . Smokeless tobacco: Never Used  Vaping Use  . Vaping Use: Never used  Substance and Sexual Activity  . Alcohol use: No  . Drug use: No  . Sexual activity: Not on file  Other Topics Concern  . Not on file  Social History Narrative  . Not on file   Social Determinants of Health   Financial Resource Strain:   . Difficulty of Paying Living Expenses: Not on file  Food Insecurity:   . Worried About Charity fundraiser in the Last Year: Not on file  . Ran Out of Food in the Last Year: Not on file  Transportation Needs:   . Lack of Transportation (Medical): Not on  file  . Lack of Transportation (Non-Medical): Not on file  Physical Activity:   . Days of Exercise per Week: Not on file  . Minutes of Exercise per Session: Not on file  Stress:   . Feeling of Stress : Not on file  Social Connections:   . Frequency of Communication with Friends and Family: Not on file  . Frequency of Social Gatherings with Friends and Family: Not on file  . Attends Religious Services: Not on file  . Active Member of Clubs or Organizations: Not on file  . Attends Archivist Meetings: Not on file  . Marital Status: Not on file  Intimate Partner Violence:   . Fear of Current or Ex-Partner: Not on file  . Emotionally Abused: Not on file  . Physically Abused: Not on file  . Sexually Abused: Not on file      PHYSICAL EXAM  Vitals:   06/25/20 1458  BP: 127/76  Pulse: 66  Weight: 252 lb 12.8 oz (114.7 kg)  Height: 5\' 6"  (1.676 m)   Body mass index is 40.8 kg/m.  Generalized: Well developed, in no acute distress  Chest: Lungs clear to auscultation bilaterally  Neurological examination  Mentation: Alert oriented to time, place, history taking. Follows all commands speech and language fluent Cranial nerve II-XII: Extraocular movements were full, visual field were full on confrontational test Head turning and shoulder shrug  were normal and symmetric. Motor: The motor testing reveals 5 over 5 strength of all 4 extremities. Good symmetric motor tone is noted throughout.  Sensory: Sensory testing is intact to soft touch on all 4 extremities. No evidence of extinction is noted.  Gait and station: Gait is normal.    DIAGNOSTIC DATA (LABS, IMAGING, TESTING) - I reviewed patient records, labs, notes, testing and imaging myself where available.  Lab Results  Component Value Date   WBC 7.0 06/15/2020   HGB 14.9 06/15/2020   HCT 44.9 06/15/2020   MCV 93.2 06/15/2020   PLT 230 06/15/2020      Component Value Date/Time   NA 141 06/15/2020 1754   NA 142  09/28/2018 1556   K 3.5 06/15/2020 1754   CL 103 06/15/2020 1754   CO2 27 06/15/2020 1754   GLUCOSE 99 06/15/2020 1754   BUN 17 06/15/2020 1754   BUN 12 09/28/2018 1556   CREATININE 0.63 06/15/2020 1754   CREATININE 0.62 12/18/2017 1035   CALCIUM 9.3 06/15/2020 1754   PROT 7.3 06/15/2020 1754   PROT 7.4 09/28/2018 1556   ALBUMIN 4.0 06/15/2020 1754   ALBUMIN 4.2 09/28/2018 1556   AST  24 06/15/2020 1754   ALT 29 06/15/2020 1754   ALKPHOS 66 06/15/2020 1754   BILITOT 0.5 06/15/2020 1754   BILITOT 0.4 09/28/2018 1556   GFRNONAA >60 06/15/2020 1754   GFRAA 109 09/28/2018 1556   Lab Results  Component Value Date   CHOL 171 09/28/2018   HDL 56 09/28/2018   LDLCALC 93 09/28/2018   TRIG 108 09/28/2018   CHOLHDL 4 09/07/2017   Lab Results  Component Value Date   HGBA1C 6.1 (H) 09/28/2018   No results found for: VITAMINB12 Lab Results  Component Value Date   TSH 2.960 09/28/2018      ASSESSMENT AND PLAN 64 y.o. year old female  has a past medical history of Asthma, Gout, Headache, Hernia, umbilical, Hypertension, Joint pain, Pre-diabetes, Seasonal allergies, and Sleep apnea. here with:  1. OSA on CPAP  - CPAP compliance excellent - Good treatment of AHI  - Encourage patient to use CPAP nightly and > 4 hours each night - F/U in 1 year or sooner if needed   I spent 25 minutes of face-to-face and non-face-to-face time with patient.  This included previsit chart review, lab review, study review, order entry, electronic health record documentation, patient education.  Ward Givens, MSN, NP-C 06/25/2020, 3:37 PM [facility] [address], [place], [place] [address] [phone]

## 2020-06-27 NOTE — Patient Instructions (Addendum)
DUE TO COVID-19 ONLY ONE VISITOR IS ALLOWED TO COME WITH YOU AND STAY IN THE WAITING ROOM ONLY DURING PRE OP AND PROCEDURE DAY OF SURGERY. THE 1 VISITOR  MAY VISIT WITH YOU AFTER SURGERY IN YOUR PRIVATE ROOM DURING VISITING HOURS ONLY!  YOU NEED TO HAVE A COVID 19 TEST ON__10/29_____ @_3 :00p_____, THIS TEST MUST BE DONE BEFORE SURGERY,  COVID TESTING SITE [place] [id number], IT IS ON THE RIGHT GOING OUT [address] APPROXIMATELY  2 MINUTES PAST [employer] ON THE RIGHT. ONCE YOUR COVID TEST IS COMPLETED,  PLEASE BEGIN THE QUARANTINE INSTRUCTIONS AS OUTLINED IN YOUR HANDOUT.                Gabrielle Moore    Your procedure is scheduled on: 07/10/20   Report to [facility] Main  Entrance   Report to admitting at  1:00 pm     Call this number if you have problems the morning of surgery [phone]    Remember: Do not eat food or drink liquids :After Midnight.   BRUSH YOUR TEETH MORNING OF SURGERY AND RINSE YOUR MOUTH OUT, NO CHEWING GUM CANDY OR MINTS.     Take these medicines the morning of surgery with A SIP OF WATER: Allopurinol, Colchicine. Bring your mask and tubing.                                 You may not have any metal on your body including              piercings  Do not wear jewelry, make-up, lotions, powders or perfumes,deodorant             Do not wear nail polish on your fingernails.  Do not shave  48 hours prior to surgery.                 Do not bring valuables to the hospital. [place].  Contacts, dentures or bridgework may not be worn into surgery.  .     Patients discharged the day of surgery will not be allowed to drive home.   IF YOU ARE HAVING SURGERY AND GOING HOME THE SAME DAY, YOU MUST HAVE AN ADULT TO DRIVE YOU HOME AND BE WITH YOU FOR 24 HOURS.   YOU MAY GO HOME BY TAXI OR UBER OR ORTHERWISE, BUT AN ADULT MUST ACCOMPANY YOU HOME AND STAY WITH YOU FOR 24  HOURS.  Name and phone number of your driver:  Special Instructions: N/A              Please read over the following fact sheets you were given: _____________________________________________________________________             [facility] - Preparing for Surgery Before surgery, you can play an important role.  Because skin is not sterile, your skin needs to be as free of germs as possible.  You can reduce the number of germs on your skin by washing with CHG (chlorahexidine gluconate) soap before surgery.  CHG is an antiseptic cleaner which kills germs and bonds with the skin to continue killing germs even after washing. Please DO NOT use if you have an allergy to CHG or antibacterial soaps.  If your skin becomes reddened/irritated stop using the CHG and inform  your nurse when you arrive at Short Stay. Do not shave (including legs and underarms) for at least 48 hours prior to the first CHG shower.  You may shave your face/neck. Please follow these instructions carefully:  1.  Shower with CHG Soap the night before surgery and the  morning of Surgery.  2.  If you choose to wash your hair, wash your hair first as usual with your  normal  shampoo.  3.  After you shampoo, rinse your hair and body thoroughly to remove the  shampoo.                                         4.  Use CHG as you would any other liquid soap.  You can apply chg directly  to the skin and wash                       Gently with a scrungie or clean washcloth.  5.  Apply the CHG Soap to your body ONLY FROM THE NECK DOWN.   Do not use on face/ open                           Wound or open sores. Avoid contact with eyes, ears mouth and genitals (private parts).                       Wash face,  Genitals (private parts) with your normal soap.             6.  Wash thoroughly, paying special attention to the area where your surgery  will be performed.  7.  Thoroughly rinse your body with warm water from the neck down.  8.  DO NOT  shower/wash with your normal soap after using and rinsing off  the CHG Soap.                9.  Pat yourself dry with a clean towel.            10.  Wear clean pajamas.            11.  Place clean sheets on your bed the night of your first shower and do not  sleep with pets. Day of Surgery : Do not apply any lotions/deodorants the morning of surgery.  Please wear clean clothes to the hospital/surgery center.  FAILURE TO FOLLOW THESE INSTRUCTIONS MAY RESULT IN THE CANCELLATION OF YOUR SURGERY PATIENT SIGNATURE_________________________________  NURSE SIGNATURE__________________________________  ________________________________________________________________________   Gabrielle Moore  An incentive spirometer is a tool that can help keep your lungs clear and active. This tool measures how well you are filling your lungs with each breath. Taking long deep breaths may help reverse or decrease the chance of developing breathing (pulmonary) problems (especially infection) following:  A long period of time when you are unable to move or be active. BEFORE THE PROCEDURE   If the spirometer includes an indicator to show your best effort, your nurse or respiratory therapist will set it to a desired goal.  If possible, sit up straight or lean slightly forward. Try not to slouch.  Hold the incentive spirometer in an upright position. INSTRUCTIONS FOR USE  1. Sit on the edge of your bed if possible, or sit up as far as you can in bed  or on a chair. 2. Hold the incentive spirometer in an upright position. 3. Breathe out normally. 4. Place the mouthpiece in your mouth and seal your lips tightly around it. 5. Breathe in slowly and as deeply as possible, raising the piston or the ball toward the top of the column. 6. Hold your breath for 3-5 seconds or for as long as possible. Allow the piston or ball to fall to the bottom of the column. 7. Remove the mouthpiece from your mouth and breathe out  normally. 8. Rest for a few seconds and repeat Steps 1 through 7 at least 10 times every 1-2 hours when you are awake. Take your time and take a few normal breaths between deep breaths. 9. The spirometer may include an indicator to show your best effort. Use the indicator as a goal to work toward during each repetition. 10. After each set of 10 deep breaths, practice coughing to be sure your lungs are clear. If you have an incision (the cut made at the time of surgery), support your incision when coughing by placing a pillow or rolled up towels firmly against it. Once you are able to get out of bed, walk around indoors and cough well. You may stop using the incentive spirometer when instructed by your caregiver.  RISKS AND COMPLICATIONS  Take your time so you do not get dizzy or light-headed.  If you are in pain, you may need to take or ask for pain medication before doing incentive spirometry. It is harder to take a deep breath if you are having pain. AFTER USE  Rest and breathe slowly and easily.  It can be helpful to keep track of a log of your progress. Your caregiver can provide you with a simple table to help with this. If you are using the spirometer at home, follow these instructions: [place] IF:   You are having difficultly using the spirometer.  You have trouble using the spirometer as often as instructed.  Your pain medication is not giving enough relief while using the spirometer.  You develop fever of 100.5 F (38.1 C) or higher. SEEK IMMEDIATE MEDICAL CARE IF:   You cough up bloody sputum that had not been present before.  You develop fever of 102 F (38.9 C) or greater.  You develop worsening pain at or near the incision site. MAKE SURE YOU:   Understand these instructions.  Will watch your condition.  Will get help right away if you are not doing well or get worse. Document Released: 01/05/2007 Document Revised: 11/17/2011 Document Reviewed:  03/08/2007 [facility] Patient Information 2014 [place], [place].   ________________________________________________________________________

## 2020-06-28 ENCOUNTER — Encounter (HOSPITAL_COMMUNITY)
Admission: RE | Admit: 2020-06-28 | Discharge: 2020-06-28 | Disposition: A | Discharge status: Home / Self Care | Payer: BC Managed Care – PPO | Source: Ambulatory Visit | Attending: Surgery | Admitting: Surgery

## 2020-06-28 ENCOUNTER — Encounter (HOSPITAL_COMMUNITY): Payer: Self-pay

## 2020-06-28 ENCOUNTER — Other Ambulatory Visit: Payer: Self-pay

## 2020-06-28 DIAGNOSIS — Z01818 Encounter for other preprocedural examination: Secondary | ICD-10-CM | POA: Diagnosis not present

## 2020-06-28 DIAGNOSIS — I10 Essential (primary) hypertension: Secondary | ICD-10-CM | POA: Insufficient documentation

## 2020-06-28 HISTORY — DX: Unspecified osteoarthritis, unspecified site: M19.90

## 2020-06-28 LAB — CBC
HCT: 44.4 % (ref 36.0–46.0)
Hemoglobin: 14.8 g/dL (ref 12.0–15.0)
MCH: 31 pg (ref 26.0–34.0)
MCHC: 33.3 g/dL (ref 30.0–36.0)
MCV: 93.1 fL (ref 80.0–100.0)
Platelets: 235 10*3/uL (ref 150–400)
RBC: 4.77 MIL/uL (ref 3.87–5.11)
RDW: 13.3 % (ref 11.5–15.5)
WBC: 6.9 10*3/uL (ref 4.0–10.5)
nRBC: 0 % (ref 0.0–0.2)

## 2020-06-28 LAB — BASIC METABOLIC PANEL
Anion gap: 13 (ref 5–15)
BUN: 20 mg/dL (ref 8–23)
CO2: 28 mmol/L (ref 22–32)
Calcium: 9.6 mg/dL (ref 8.9–10.3)
Chloride: 101 mmol/L (ref 98–111)
Creatinine, Ser: 0.7 mg/dL (ref 0.44–1.00)
GFR, Estimated: >60 mL/min (ref >60)
Glucose, Bld: 105 mg/dL — ABNORMAL HIGH (ref 70–99)
Potassium: 3.7 mmol/L (ref 3.5–5.1)
Sodium: 142 mmol/L (ref 135–145)

## 2020-06-28 NOTE — Progress Notes (Signed)
COVID Vaccine Completed:Yes Date COVID Vaccine completed:02/11/20 COVID vaccine manufacturer: [place] [employer]    PCP - Dr. Cristi Loron Cardiologist - no  Chest x-ray - no EKG - 06/28/20 Stress Test - no ECHO - no Cardiac Cath - no Pacemaker/ICD device last checked:NA  Sleep Study - yes CPAP - yes  Fasting Blood Sugar - NA Checks Blood Sugar _____ times a day  Blood Thinner Instructions:NA Aspirin Instructions: Last Dose:  Anesthesia review:   Patient denies shortness of breath, fever, cough and chest pain at PAT appointmentYes   Patient verbalized understanding of instructions that were given to them at the PAT appointment. Patient was also instructed that they will need to review over the PAT instructions again at home before surgery. Yes Pt doesn't climb stairs. She has no SOB doing housework or with ADLs  She has recently lost 25 lbs.

## 2020-07-06 ENCOUNTER — Other Ambulatory Visit (HOSPITAL_COMMUNITY)
Admission: RE | Admit: 2020-07-06 | Discharge: 2020-07-06 | Disposition: A | Discharge status: Home / Self Care | Payer: BC Managed Care – PPO | Source: Ambulatory Visit | Attending: Surgery | Admitting: Surgery

## 2020-07-06 DIAGNOSIS — Z01812 Encounter for preprocedural laboratory examination: Secondary | ICD-10-CM | POA: Insufficient documentation

## 2020-07-06 DIAGNOSIS — Z20822 Contact with and (suspected) exposure to covid-19: Secondary | ICD-10-CM | POA: Insufficient documentation

## 2020-07-07 LAB — SARS CORONAVIRUS 2 (TAT 6-24 HRS): SARS Coronavirus 2: NEGATIVE

## 2020-07-08 NOTE — H&P (Signed)
Chief Complaint:  Incarcerated ventral hernia and lapband dysfunction  History of Present Illness:  Gabrielle Moore is an 64 y.o. female who was seen urgently a few weeks ago for an acute ventral hernia which by CT contained omentum.  She is desiring removal of her lapband for malfunction and presents today for both procedures.   Her hernia is at her umbilicus.     Past Medical History:  Diagnosis Date  . Arthritis    hands  . Asthma    as a child  . Gout   . Hernia, umbilical   . Hypertension   . Joint pain   . Pre-diabetes    "LABELED AS PRE" -- TAKES NO MEDS  . Seasonal allergies   . Sleep apnea     Past Surgical History:  Procedure Laterality Date  . BREAST SURGERY  1998   breast reduction  . CESAREAN SECTION  1996  . HERNIA REPAIR    . LAPAROSCOPIC GASTRIC BANDING  2009  . LAPAROSCOPIC GASTRIC BANDING  2009  . SHOULDER SURGERY    . SINUS ENDO W/FUSION Left 05/26/2018   Procedure: ENDOSCOPIC SINUS SURGERY WITH NAVIGATION;  Surgeon: Jerrell Belfast, MD;  Location: [place];  Service: ENT;  Laterality: Left;  . TONSILLECTOMY AND ADENOIDECTOMY    . TURBINATE REDUCTION Bilateral 05/26/2018   Procedure: TURBINATE REDUCTION;  Surgeon: Jerrell Belfast, MD;  Location: [place];  Service: ENT;  Laterality: Bilateral;    No current facility-administered medications for this encounter.   Current Outpatient Medications  Medication Sig Dispense Refill  . allopurinol (ZYLOPRIM) 300 MG tablet Take 1 tablet (300 mg total) by mouth daily. 30 tablet 3  . Cholecalciferol (VITAMIN D) 50 MCG (2000 UT) CAPS Take 2,000 Units by mouth daily.    . Cyanocobalamin (B-12) 3000 MCG CAPS Take 3,000 mcg by mouth daily.    Marland Kitchen gabapentin (NEURONTIN) 300 MG capsule Take 300 mg by mouth at bedtime.    Marland Kitchen loratadine (CLARITIN) 10 MG tablet Take 10 mg by mouth daily.    . valsartan-hydrochlorothiazide (DIOVAN-HCT) 160-12.5 MG tablet Take 1 tablet by mouth daily.    . colchicine 0.6 MG tablet Take 1 tablet  (0.6 mg total) by mouth daily. (Patient not taking: Reported on 06/27/2020) 10 tablet 0  . ibuprofen (ADVIL,MOTRIN) 200 MG tablet Take 200 mg by mouth as needed. (Patient not taking: Reported on 06/27/2020)    . NON FORMULARY Pt uses cpap nightly    . valsartan-hydrochlorothiazide (DIOVAN-HCT) 80-12.5 MG tablet TAKE 1 TABLET BY MOUTH DAILY (Patient not taking: Reported on 06/27/2020) 90 tablet 0   Codeine and Keflex [cephalexin] Family History  Problem Relation Age of Onset  . Hyperlipidemia Mother   . Hypertension Mother   . Hyperlipidemia Father   . Hypertension Father   . Heart disease Father 26  . Heart attack Father   . Depression Father   . Stroke Father   . Heart disease Other        parent    Social History:   reports that she has never smoked. She has never used smokeless tobacco. She reports that she does not drink alcohol and does not use drugs.   REVIEW OF SYSTEMS : Negative except for see problem list  Physical Exam:   There were no vitals taken for this visit. There is no height or weight on file to calculate BMI.  Gen:  WDWN WF NAD  Neurological: Alert and oriented to person, place, and time. Motor and sensory  function is grossly intact  Head: Normocephalic and atraumatic.  Eyes: Conjunctivae are normal. Pupils are equal, round, and reactive to light. No scleral icterus.  Neck: Normal range of motion. Neck supple. No tracheal deviation or thyromegaly present.  Cardiovascular:  SR without murmurs or gallops.  No carotid bruits Breast:  Not examined Respiratory: Effort normal.  No respiratory distress. No chest wall tenderness. Breath sounds normal.  No wheezes, rales or rhonchi.  Abdomen:  Palpable mass in her upper midline GU:  Not examined.  Musculoskeletal: Normal range of motion. Extremities are nontender. No cyanosis, edema or clubbing noted Lymphadenopathy: No cervical, preauricular, postauricular or axillary adenopathy is present Skin: Skin is warm and  dry. No rash noted. No diaphoresis. No erythema. No pallor. Pscyh: Normal mood and affect. Behavior is normal. Judgment and thought content normal.   LABORATORY RESULTS: Results for orders placed or performed during the hospital encounter of 07/06/20 (from the past 48 hour(s))  SARS CORONAVIRUS 2 (TAT 6-24 HRS) Nasopharyngeal Nasopharyngeal Swab     Status: None   Collection Time: 07/06/20  2:48 PM   Specimen: Nasopharyngeal Swab  Result Value Ref Range   SARS Coronavirus 2 NEGATIVE NEGATIVE    Comment: (NOTE) SARS-CoV-2 target nucleic acids are NOT DETECTED.  The SARS-CoV-2 RNA is generally detectable in upper and lower respiratory specimens during the acute phase of infection. Negative results do not preclude SARS-CoV-2 infection, do not rule out co-infections with other pathogens, and should not be used as the sole basis for treatment or other patient management decisions. Negative results must be combined with clinical observations, patient history, and epidemiological information. The expected result is Negative.  Fact Sheet for Patients: SugarRoll.be  Fact Sheet for Healthcare Providers: https://www.woods-mathews.com/  This test is not yet approved or cleared by the [place] FDA and  has been authorized for detection and/or diagnosis of SARS-CoV-2 by FDA under an Emergency Use Authorization (EUA). This EUA will remain  in effect (meaning this test can be used) for the duration of the COVID-19 declaration under Se ction 564(b)(1) of the Act, 21 U.S.C. section 360bbb-3(b)(1), unless the authorization is terminated or revoked sooner.  Performed at [facility], [place] [address] [place], [place] [address]      RADIOLOGY RESULTS: No results found.  Problem List: Patient Active Problem List   Diagnosis Date Noted  . Vitamin D deficiency 10/27/2018  . Prediabetes 10/13/2018  . Class 3 severe obesity with serious  comorbidity and body mass index (BMI) of 40.0 to 44.9 in adult [facility]) 09/30/2018  . Status post bariatric surgery 06/16/2018  . Sinusitis, chronic 05/26/2018  . Chronic ethmoidal sinusitis 03/31/2018  . Chronic nonintractable headache 03/31/2018  . OSA on CPAP 06/16/2016  . Snoring 05/02/2013  . Morbid obesity ([place]) 07/19/2012  . Hypertension 07/19/2012  . History of laparoscopic adjustable gastric banding, APS, 01/25/2008. 11/14/2011    Assessment & Plan: Lapband dysfunction and incarcerated ventral hernia.  For lapband removal and repair of hernia.     Matt B. Hassell Done, MD, [facility] [phone] beeper [phone]  07/08/2020 1:21 PM

## 2020-07-09 NOTE — Progress Notes (Signed)
[facility] in [place] called. Dr. Hassell Done will be moving pt's 07-10-20 surgery time from 3pm to 1pm. Chart's in Shortstay at time of notification. Spoke to [employer] nurse will contact patient regarding change in surgery time.

## 2020-07-10 ENCOUNTER — Encounter (HOSPITAL_COMMUNITY): Payer: Self-pay | Admitting: Surgery

## 2020-07-10 ENCOUNTER — Encounter (HOSPITAL_COMMUNITY)
Admission: RE | Disposition: A | Discharge status: Home / Self Care | Payer: Self-pay | Source: Other Acute Inpatient Hospital | Attending: Surgery

## 2020-07-10 ENCOUNTER — Ambulatory Visit (HOSPITAL_COMMUNITY): Payer: BC Managed Care – PPO | Admitting: Anesthesiology

## 2020-07-10 ENCOUNTER — Ambulatory Visit (HOSPITAL_COMMUNITY)
Admission: RE | Admit: 2020-07-10 | Discharge: 2020-07-10 | Disposition: A | Discharge status: Home / Self Care | Payer: BC Managed Care – PPO | Source: Other Acute Inpatient Hospital | Attending: Surgery | Admitting: Surgery

## 2020-07-10 ENCOUNTER — Ambulatory Visit (HOSPITAL_COMMUNITY): Payer: BC Managed Care – PPO | Admitting: Physician Assistant

## 2020-07-10 DIAGNOSIS — Z885 Allergy status to narcotic agent status: Secondary | ICD-10-CM | POA: Diagnosis not present

## 2020-07-10 DIAGNOSIS — Z823 Family history of stroke: Secondary | ICD-10-CM | POA: Diagnosis not present

## 2020-07-10 DIAGNOSIS — Z8249 Family history of ischemic heart disease and other diseases of the circulatory system: Secondary | ICD-10-CM | POA: Diagnosis not present

## 2020-07-10 DIAGNOSIS — K9509 Other complications of gastric band procedure: Secondary | ICD-10-CM | POA: Diagnosis not present

## 2020-07-10 DIAGNOSIS — R7303 Prediabetes: Secondary | ICD-10-CM | POA: Diagnosis not present

## 2020-07-10 DIAGNOSIS — Z79899 Other long term (current) drug therapy: Secondary | ICD-10-CM | POA: Insufficient documentation

## 2020-07-10 DIAGNOSIS — Z818 Family history of other mental and behavioral disorders: Secondary | ICD-10-CM | POA: Diagnosis not present

## 2020-07-10 DIAGNOSIS — Z6839 Body mass index (BMI) 39.0-39.9, adult: Secondary | ICD-10-CM | POA: Diagnosis not present

## 2020-07-10 DIAGNOSIS — Z9884 Bariatric surgery status: Secondary | ICD-10-CM | POA: Diagnosis not present

## 2020-07-10 DIAGNOSIS — G4733 Obstructive sleep apnea (adult) (pediatric): Secondary | ICD-10-CM | POA: Insufficient documentation

## 2020-07-10 DIAGNOSIS — Z8349 Family history of other endocrine, nutritional and metabolic diseases: Secondary | ICD-10-CM | POA: Insufficient documentation

## 2020-07-10 DIAGNOSIS — I10 Essential (primary) hypertension: Secondary | ICD-10-CM | POA: Diagnosis not present

## 2020-07-10 DIAGNOSIS — J45909 Unspecified asthma, uncomplicated: Secondary | ICD-10-CM | POA: Diagnosis not present

## 2020-07-10 DIAGNOSIS — Z881 Allergy status to other antibiotic agents status: Secondary | ICD-10-CM | POA: Insufficient documentation

## 2020-07-10 DIAGNOSIS — Z791 Long term (current) use of non-steroidal anti-inflammatories (NSAID): Secondary | ICD-10-CM | POA: Insufficient documentation

## 2020-07-10 DIAGNOSIS — K436 Other and unspecified ventral hernia with obstruction, without gangrene: Secondary | ICD-10-CM | POA: Insufficient documentation

## 2020-07-10 DIAGNOSIS — E559 Vitamin D deficiency, unspecified: Secondary | ICD-10-CM | POA: Diagnosis not present

## 2020-07-10 SURGERY — REMOVAL, GASTRIC BAND, LAPAROSCOPIC
Anesthesia: General | Site: Abdomen

## 2020-07-10 MED ORDER — CEFAZOLIN SODIUM-DEXTROSE 2-4 GM/100ML-% IV SOLN
2.0000 g | INTRAVENOUS | Status: AC
  Administered 2020-07-10: 2 g via INTRAVENOUS
  Filled 2020-07-10: qty 100

## 2020-07-10 MED ORDER — ACETAMINOPHEN 500 MG PO TABS
1000.0000 mg | ORAL_TABLET | Freq: Once | ORAL | Status: AC
  Administered 2020-07-10: 1000 mg via ORAL

## 2020-07-10 MED ORDER — DEXAMETHASONE SODIUM PHOSPHATE 10 MG/ML IJ SOLN
INTRAMUSCULAR | Status: DC | PRN
  Administered 2020-07-10: 10 mg via INTRAVENOUS

## 2020-07-10 MED ORDER — ROCURONIUM BROMIDE 10 MG/ML (PF) SYRINGE
PREFILLED_SYRINGE | INTRAVENOUS | Status: DC | PRN
  Administered 2020-07-10: 10 mg via INTRAVENOUS
  Administered 2020-07-10: 50 mg via INTRAVENOUS
  Administered 2020-07-10: 20 mg via INTRAVENOUS
  Administered 2020-07-10: 10 mg via INTRAVENOUS

## 2020-07-10 MED ORDER — SUGAMMADEX SODIUM 200 MG/2ML IV SOLN
INTRAVENOUS | Status: DC | PRN
  Administered 2020-07-10: 300 mg via INTRAVENOUS

## 2020-07-10 MED ORDER — ACETAMINOPHEN 500 MG PO TABS
ORAL_TABLET | ORAL | Status: AC
  Filled 2020-07-10: qty 2

## 2020-07-10 MED ORDER — OXYCODONE HCL 5 MG PO TABS: 5.0000 mg | ORAL_TABLET | Freq: Once | ORAL | Status: AC

## 2020-07-10 MED ORDER — KETAMINE HCL 10 MG/ML IJ SOLN
INTRAMUSCULAR | Status: DC | PRN
  Administered 2020-07-10: 50 mg via INTRAVENOUS

## 2020-07-10 MED ORDER — ONDANSETRON HCL 4 MG/2ML IJ SOLN
INTRAMUSCULAR | Status: AC
  Filled 2020-07-10: qty 2

## 2020-07-10 MED ORDER — 0.9 % SODIUM CHLORIDE (POUR BTL) OPTIME
TOPICAL | Status: DC | PRN
  Administered 2020-07-10: 1000 mL

## 2020-07-10 MED ORDER — EPHEDRINE 5 MG/ML INJ
INTRAVENOUS | Status: AC
  Filled 2020-07-10: qty 10

## 2020-07-10 MED ORDER — MIDAZOLAM HCL 2 MG/2ML IJ SOLN
INTRAMUSCULAR | Status: AC
  Filled 2020-07-10: qty 2

## 2020-07-10 MED ORDER — PROPOFOL 10 MG/ML IV BOLUS
INTRAVENOUS | Status: DC | PRN
  Administered 2020-07-10: 170 mg via INTRAVENOUS

## 2020-07-10 MED ORDER — BUPIVACAINE LIPOSOME 1.3 % IJ SUSP
Freq: Once | INTRAMUSCULAR | Status: AC
  Administered 2020-07-10: 20 mL
  Filled 2020-07-10 (×3): qty 20

## 2020-07-10 MED ORDER — ORAL CARE MOUTH RINSE: 15.0000 mL | Freq: Once | OROMUCOSAL | Status: AC

## 2020-07-10 MED ORDER — SUGAMMADEX SODIUM 500 MG/5ML IV SOLN
INTRAVENOUS | Status: AC
  Filled 2020-07-10: qty 5

## 2020-07-10 MED ORDER — FENTANYL CITRATE (PF) 250 MCG/5ML IJ SOLN
INTRAMUSCULAR | Status: AC
  Filled 2020-07-10: qty 5

## 2020-07-10 MED ORDER — PROPOFOL 10 MG/ML IV BOLUS
INTRAVENOUS | Status: AC
  Filled 2020-07-10: qty 20

## 2020-07-10 MED ORDER — ROCURONIUM BROMIDE 10 MG/ML (PF) SYRINGE
PREFILLED_SYRINGE | INTRAVENOUS | Status: AC
  Filled 2020-07-10: qty 10

## 2020-07-10 MED ORDER — CHLORHEXIDINE GLUCONATE CLOTH 2 % EX PADS: 6.0000 | MEDICATED_PAD | Freq: Once | CUTANEOUS | Status: DC

## 2020-07-10 MED ORDER — LIDOCAINE 2% (20 MG/ML) 5 ML SYRINGE
INTRAMUSCULAR | Status: DC | PRN
  Administered 2020-07-10: 1.5 mg/kg/h via INTRAVENOUS

## 2020-07-10 MED ORDER — SODIUM CHLORIDE (PF) 0.9 % IJ SOLN
INTRAMUSCULAR | Status: AC
  Filled 2020-07-10: qty 10

## 2020-07-10 MED ORDER — LIDOCAINE 2% (20 MG/ML) 5 ML SYRINGE
INTRAMUSCULAR | Status: AC
  Filled 2020-07-10: qty 5

## 2020-07-10 MED ORDER — SUCCINYLCHOLINE CHLORIDE 200 MG/10ML IV SOSY
PREFILLED_SYRINGE | INTRAVENOUS | Status: DC | PRN
  Administered 2020-07-10: 100 mg via INTRAVENOUS

## 2020-07-10 MED ORDER — SODIUM CHLORIDE (PF) 0.9 % IJ SOLN
INTRAMUSCULAR | Status: DC | PRN
  Administered 2020-07-10: 10 mL

## 2020-07-10 MED ORDER — CHLORHEXIDINE GLUCONATE 0.12 % MT SOLN
15.0000 mL | Freq: Once | OROMUCOSAL | Status: AC
  Administered 2020-07-10: 15 mL via OROMUCOSAL

## 2020-07-10 MED ORDER — OXYCODONE HCL 5 MG PO TABS: 5.0000 mg | ORAL_TABLET | Freq: Four times a day (QID) | ORAL | 0 refills | Status: DC | PRN

## 2020-07-10 MED ORDER — ONDANSETRON HCL 4 MG/2ML IJ SOLN
INTRAMUSCULAR | Status: DC | PRN
  Administered 2020-07-10: 4 mg via INTRAVENOUS

## 2020-07-10 MED ORDER — OXYCODONE HCL 5 MG PO TABS
ORAL_TABLET | ORAL | Status: AC
  Administered 2020-07-10: 5 mg via ORAL
  Filled 2020-07-10: qty 1

## 2020-07-10 MED ORDER — FENTANYL CITRATE (PF) 100 MCG/2ML IJ SOLN
25.0000 ug | INTRAMUSCULAR | Status: DC | PRN
  Administered 2020-07-10 (×3): 25 ug via INTRAVENOUS

## 2020-07-10 MED ORDER — FENTANYL CITRATE (PF) 250 MCG/5ML IJ SOLN
INTRAMUSCULAR | Status: DC | PRN
  Administered 2020-07-10 (×3): 50 ug via INTRAVENOUS
  Administered 2020-07-10: 100 ug via INTRAVENOUS

## 2020-07-10 MED ORDER — BUPIVACAINE HCL 0.25 % IJ SOLN
INTRAMUSCULAR | Status: AC
  Filled 2020-07-10: qty 1

## 2020-07-10 MED ORDER — MIDAZOLAM HCL 2 MG/2ML IJ SOLN
INTRAMUSCULAR | Status: DC | PRN
  Administered 2020-07-10: 2 mg via INTRAVENOUS

## 2020-07-10 MED ORDER — LACTATED RINGERS IV SOLN: INTRAVENOUS | Status: DC

## 2020-07-10 MED ORDER — DEXAMETHASONE SODIUM PHOSPHATE 10 MG/ML IJ SOLN
INTRAMUSCULAR | Status: AC
  Filled 2020-07-10: qty 1

## 2020-07-10 MED ORDER — ONDANSETRON 4 MG PO TBDP: 4.0000 mg | ORAL_TABLET | Freq: Four times a day (QID) | ORAL | 0 refills | Status: DC | PRN

## 2020-07-10 MED ORDER — FENTANYL CITRATE (PF) 100 MCG/2ML IJ SOLN
INTRAMUSCULAR | Status: AC
  Administered 2020-07-10: 25 ug via INTRAVENOUS
  Filled 2020-07-10: qty 2

## 2020-07-10 MED ORDER — EPHEDRINE SULFATE-NACL 50-0.9 MG/10ML-% IV SOSY
PREFILLED_SYRINGE | INTRAVENOUS | Status: DC | PRN
  Administered 2020-07-10 (×2): 5 mg via INTRAVENOUS
  Administered 2020-07-10: 10 mg via INTRAVENOUS

## 2020-07-10 MED ORDER — AMISULPRIDE (ANTIEMETIC) 5 MG/2ML IV SOLN: 10.0000 mg | Freq: Once | INTRAVENOUS | Status: DC | PRN

## 2020-07-10 MED ORDER — LIDOCAINE 2% (20 MG/ML) 5 ML SYRINGE
INTRAMUSCULAR | Status: DC | PRN
  Administered 2020-07-10: 60 mg via INTRAVENOUS

## 2020-07-10 SURGICAL SUPPLY — 53 items
ADH SKN CLS APL DERMABOND .7 (GAUZE/BANDAGES/DRESSINGS) ×1
APL SKNCLS STERI-STRIP NONHPOA (GAUZE/BANDAGES/DRESSINGS)
BENZOIN TINCTURE PRP APPL 2/3 (GAUZE/BANDAGES/DRESSINGS) IMPLANT
BINDER ABDOMINAL 12 ML 46-62 (SOFTGOODS) ×1 IMPLANT
BLADE HEX COATED 2.75 (ELECTRODE) ×2 IMPLANT
BLADE SURG 15 STRL LF DISP TIS (BLADE) ×1 IMPLANT
BLADE SURG 15 STRL SS (BLADE) ×2
COVER WAND RF STERILE (DRAPES) IMPLANT
DECANTER SPIKE VIAL GLASS SM (MISCELLANEOUS) ×4 IMPLANT
DERMABOND ADVANCED (GAUZE/BANDAGES/DRESSINGS) ×1
DERMABOND ADVANCED .7 DNX12 (GAUZE/BANDAGES/DRESSINGS) IMPLANT
DEVICE TROCAR PUNCTURE CLOSURE (ENDOMECHANICALS) ×1 IMPLANT
DISSECTOR BLUNT TIP ENDO 5MM (MISCELLANEOUS) IMPLANT
DRSG TEGADERM 4X4.75 (GAUZE/BANDAGES/DRESSINGS) ×2 IMPLANT
ELECT L-HOOK LAP 45CM DISP (ELECTROSURGICAL) ×2
ELECT REM PT RETURN 15FT ADLT (MISCELLANEOUS) ×2 IMPLANT
ELECTRODE L-HOOK LAP 45CM DISP (ELECTROSURGICAL) ×1 IMPLANT
GAUZE 4X4 16PLY RFD (DISPOSABLE) ×1 IMPLANT
GAUZE SPONGE 4X4 12PLY STRL (GAUZE/BANDAGES/DRESSINGS) ×2 IMPLANT
GLOVE BIOGEL M 8.0 STRL (GLOVE) ×2 IMPLANT
GLOVE BIOGEL PI IND STRL 7.0 (GLOVE) ×1 IMPLANT
GLOVE BIOGEL PI INDICATOR 7.0 (GLOVE) ×1
GOWN SPEC L4 XLG W/TWL (GOWN DISPOSABLE) ×2 IMPLANT
GOWN STRL REUS W/TWL XL LVL3 (GOWN DISPOSABLE) ×6 IMPLANT
KIT BASIN OR (CUSTOM PROCEDURE TRAY) ×2 IMPLANT
KIT TURNOVER KIT A (KITS) IMPLANT
MAT PREVALON FULL STRYKER (MISCELLANEOUS) ×2 IMPLANT
NDL SPNL 22GX3.5 QUINCKE BK (NEEDLE) ×1 IMPLANT
NEEDLE SPNL 22GX3.5 QUINCKE BK (NEEDLE) ×2 IMPLANT
NS IRRIG 1000ML POUR BTL (IV SOLUTION) ×2 IMPLANT
PACK UNIVERSAL I (CUSTOM PROCEDURE TRAY) ×2 IMPLANT
PENCIL SMOKE EVACUATOR (MISCELLANEOUS) IMPLANT
SCISSORS LAP 5X45 EPIX DISP (ENDOMECHANICALS) ×2 IMPLANT
SET IRRIG TUBING LAPAROSCOPIC (IRRIGATION / IRRIGATOR) IMPLANT
SET TUBE SMOKE EVAC HIGH FLOW (TUBING) ×2 IMPLANT
SHEARS HARMONIC ACE PLUS 45CM (MISCELLANEOUS) ×2 IMPLANT
SLEEVE ADV FIXATION 5X100MM (TROCAR) ×2 IMPLANT
SOL ANTI FOG 6CC (MISCELLANEOUS) ×1 IMPLANT
SOLUTION ANTI FOG 6CC (MISCELLANEOUS) ×1
SPONGE LAP 18X18 RF (DISPOSABLE) ×2 IMPLANT
STAPLER VISISTAT 35W (STAPLE) ×2 IMPLANT
STRIP CLOSURE SKIN 1/2X4 (GAUZE/BANDAGES/DRESSINGS) IMPLANT
SUT MNCRL AB 4-0 PS2 18 (SUTURE) ×2 IMPLANT
SUT NOVA NAB DX-16 0-1 5-0 T12 (SUTURE) ×2 IMPLANT
SUT VIC AB 2-0 SH 27 (SUTURE) ×2
SUT VIC AB 2-0 SH 27X BRD (SUTURE) IMPLANT
SUT VIC AB 4-0 SH 18 (SUTURE) ×1 IMPLANT
SYR 20ML LL LF (SYRINGE) ×2 IMPLANT
TOWEL OR 17X26 10 PK STRL BLUE (TOWEL DISPOSABLE) ×4 IMPLANT
TOWEL OR NON WOVEN STRL DISP B (DISPOSABLE) ×2 IMPLANT
TROCAR ADV FIXATION 5X100MM (TROCAR) ×2 IMPLANT
TROCAR BLADELESS 15MM (ENDOMECHANICALS) ×2 IMPLANT
TROCAR BLADELESS OPT 5 100 (ENDOMECHANICALS) ×2 IMPLANT

## 2020-07-10 NOTE — Transfer of Care (Signed)
Immediate Anesthesia Transfer of Care Note  Patient: Gabrielle Moore  Procedure(s) Performed: REMOVAL OF LAPBAND, PRIMARY REPAIR OF UMBILICAL HERNIA (N/A Abdomen)  Patient Location: PACU  Anesthesia Type:General  Level of Consciousness: sedated  Airway & Oxygen Therapy: Patient Spontanous Breathing and Patient connected to face mask oxygen  Post-op Assessment: Report given to RN and Post -op Vital signs reviewed and stable  Post vital signs: Reviewed and stable  Last Vitals:  Vitals Value Taken Time  BP    Temp    Pulse    Resp    SpO2      Last Pain:  Vitals:   07/10/20 1134  TempSrc:   PainSc: 0-No pain         Complications: No complications documented.

## 2020-07-10 NOTE — Op Note (Signed)
Gabrielle Moore  1956/07/05   07/10/2020    PCP:  Lawerance Cruel, MD   Surgeon: Kaylyn Lim, MD, FACS  Asst:  Alphonsa Overall, MD, FACS  Anes:  general  Preop Dx: lapband in place (malfunction) and incarcerated ventral hernia Postop Dx: Status post laparoscopic removal of lap band with tubing and port and repair of incarcerated ventral hernia that contained omentum.  Procedure: Laparoscopy and removal of incarcerated omentum from ventral umbilical hernia-prior trocar site.  Removal of lap band and port and then primary repair of the hernia with #1 Novafil's. Location Surgery: [place] long OR #1 Complications: None noted  EBL:   30 cc  Drains: None  Description of Procedure:  The patient was taken to OR 1.  After anesthesia was administered and the patient was prepped  with ChloraPrep and a timeout was performed.  Access to the abdomen was achieved through the left upper quadrant with a 5 mm Optiview without difficulty.  Following insufflation some scissor dissection was used to take down a few adhesions from her previous surgery.  The omentum that was stuck up in this hernia was grasped with a Prestige grasper and for the most part pulled out.  Some of it separated and there was subsequently removed.  When I had removed the most of this there was some bleeding so I got the harmonic scalpel and control the that with the harmonic scalpel.  I then made a small transverse incision at this site and placed a 15 trocar through this hole.  I then placed two 5 mm trochars in the usual position for this procedure.  We instilled 30 cc of Exparel on either side to create a tap block.  Adhesions were taken down around the liver and the tubing was adherent somewhat to the liver.  I used the hook electrocautery to cut down onto the band buckle which was then able to unbuckle and then I cut the band undid the band and pulled it through.  The band was then brought through the 15 mm trocar in the  umbilical hernia.  A portion of the plication was taken down.  I then cut down on the port site and excised that.  The the LAP-BAND port became disconnected at the connector point and so.  I then repaired the umbilical hernia primarily getting good bites of fascia transversely and closing the defect with approximately 6 interrupted #1 Novafil's burying the knots.  We then went went back in with the laparoscope and and looked at the umbilical closure and which looked good and it was airtight.  We then grasped the other portion of the tubing and pull that out and this brought out the little titanium connector which I disconnected from the port and as well as the remaining tubing.  We then surveyed the abdomen and everything appeared to be in order.  The abdomen was then deflated and the trochars were removed and the incisions closed with 4-0 Vicryl in the case of the umbilical region and 4-0 Monocryl.  The patient tolerated the procedure well and was taken to the PACU in stable condition.     Matt B. Hassell Done, [place], [facility] [place]

## 2020-07-10 NOTE — Discharge Instructions (Signed)
Advance diet as tolerated. Wear abdominal binder when up and about Take pain pills as needed.   Bariatric Coordinator will call tomorrow (11/3).

## 2020-07-10 NOTE — Anesthesia Postprocedure Evaluation (Signed)
Anesthesia Post Note  Patient: Gabrielle Moore  Procedure(s) Performed: REMOVAL OF LAPBAND, PRIMARY REPAIR OF UMBILICAL HERNIA (N/A Abdomen)     Patient location during evaluation: PACU Anesthesia Type: General Level of consciousness: awake and alert Pain management: pain level controlled Vital Signs Assessment: post-procedure vital signs reviewed and stable Respiratory status: spontaneous breathing, nonlabored ventilation, respiratory function stable and patient connected to nasal cannula oxygen Cardiovascular status: blood pressure returned to baseline and stable Postop Assessment: no apparent nausea or vomiting Anesthetic complications: no   No complications documented.  Last Vitals:  Vitals:   07/10/20 1815 07/10/20 1830  BP: 137/80 (!) 141/77  Pulse: 95 (!) 103  Resp: 11 16  Temp:    SpO2: 94% 96%    Last Pain:  Vitals:   07/10/20 1930  TempSrc:   PainSc: 6                  Tiajuana Amass

## 2020-07-10 NOTE — Anesthesia Procedure Notes (Signed)
Date/Time: 07/10/2020 5:20 PM Performed by: Cynda Familia, CRNA Oxygen Delivery Method: Simple face mask Placement Confirmation: positive ETCO2 and breath sounds checked- equal and bilateral Dental Injury: Teeth and Oropharynx as per pre-operative assessment

## 2020-07-10 NOTE — Anesthesia Preprocedure Evaluation (Signed)
Anesthesia Evaluation  Patient identified by MRN, date of birth, ID band Patient awake    Reviewed: Allergy & Precautions, NPO status , Patient's Chart, lab work & pertinent test results  Airway Mallampati: II  TM Distance: >3 FB     Dental  (+) Dental Advisory Given   Pulmonary asthma , sleep apnea ,    breath sounds clear to auscultation       Cardiovascular hypertension, Pt. on medications  Rhythm:Regular Rate:Normal     Neuro/Psych  Headaches,    GI/Hepatic negative GI ROS, Neg liver ROS,   Endo/Other  Morbid obesity  Renal/GU negative Renal ROS     Musculoskeletal  (+) Arthritis ,   Abdominal   Peds  Hematology negative hematology ROS (+)   Anesthesia Other Findings   Reproductive/Obstetrics                             Lab Results  Component Value Date   WBC 6.9 06/28/2020   HGB 14.8 06/28/2020   HCT 44.4 06/28/2020   MCV 93.1 06/28/2020   PLT 235 06/28/2020   Lab Results  Component Value Date   CREATININE 0.70 06/28/2020   BUN 20 06/28/2020   NA 142 06/28/2020   K 3.7 06/28/2020   CL 101 06/28/2020   CO2 28 06/28/2020    Anesthesia Physical Anesthesia Plan  ASA: III  Anesthesia Plan: General   Post-op Pain Management:    Induction: Intravenous  PONV Risk Score and Plan: 3 and Dexamethasone, Ondansetron, Treatment may vary due to age or medical condition, Scopolamine patch - Pre-op and Midazolam  Airway Management Planned: Oral ETT  Additional Equipment:   Intra-op Plan:   Post-operative Plan: Extubation in OR  Informed Consent: I have reviewed the patients History and Physical, chart, labs and discussed the procedure including the risks, benefits and alternatives for the proposed anesthesia with the patient or authorized representative who has indicated his/her understanding and acceptance.     Dental advisory given  Plan Discussed with:  CRNA  Anesthesia Plan Comments:         Anesthesia Quick Evaluation

## 2020-07-10 NOTE — Interval H&P Note (Signed)
History and Physical Interval Note:  07/10/2020 2:24 PM  Gabrielle Moore  has presented today for surgery, with the diagnosis of LAPBAND DYSFUNCTION AND INCARCERATED UMBILICAL HERNIA.  The various methods of treatment have been discussed with the patient and family. After consideration of risks, benefits and other options for treatment, the patient has consented to  Procedure(s): REMOVAL OF LAPBAND, REPAIR OF UMBILICAL HERNIA (N/A) as a surgical intervention.  The patient's history has been reviewed, patient examined, no change in status, stable for surgery.  I have reviewed the patient's chart and labs.  Questions were answered to the patient's satisfaction.     Pedro Earls

## 2020-07-10 NOTE — Anesthesia Procedure Notes (Signed)
Procedure Name: Intubation Date/Time: 07/10/2020 2:42 PM Performed by: Sharlette Dense, CRNA Patient Re-evaluated:Patient Re-evaluated prior to induction Oxygen Delivery Method: Circle system utilized Preoxygenation: Pre-oxygenation with 100% oxygen Induction Type: IV induction and Rapid sequence Laryngoscope Size: Glidescope and 3 Grade View: Grade I Tube type: Parker flex tip Tube size: 7.0 mm Number of attempts: 1 Airway Equipment and Method: Stylet and Video-laryngoscopy Placement Confirmation: ETT inserted through vocal cords under direct vision,  positive ETCO2 and breath sounds checked- equal and bilateral Secured at: 20 cm Dental Injury: Teeth and Oropharynx as per pre-operative assessment  Difficulty Due To: Difficulty was anticipated, Difficult Airway- due to anterior larynx and Difficult Airway- due to limited oral opening Future Recommendations: Recommend- induction with short-acting agent, and alternative techniques readily available

## 2020-07-16 IMAGING — DX DG CHEST 2V
2 series · 2 of 2 positions shown · non-contrast
Comparison: 01/07/2017

CLINICAL DATA: Cough, wheezing

EXAM:
CHEST - 2 VIEW

[chest pa]
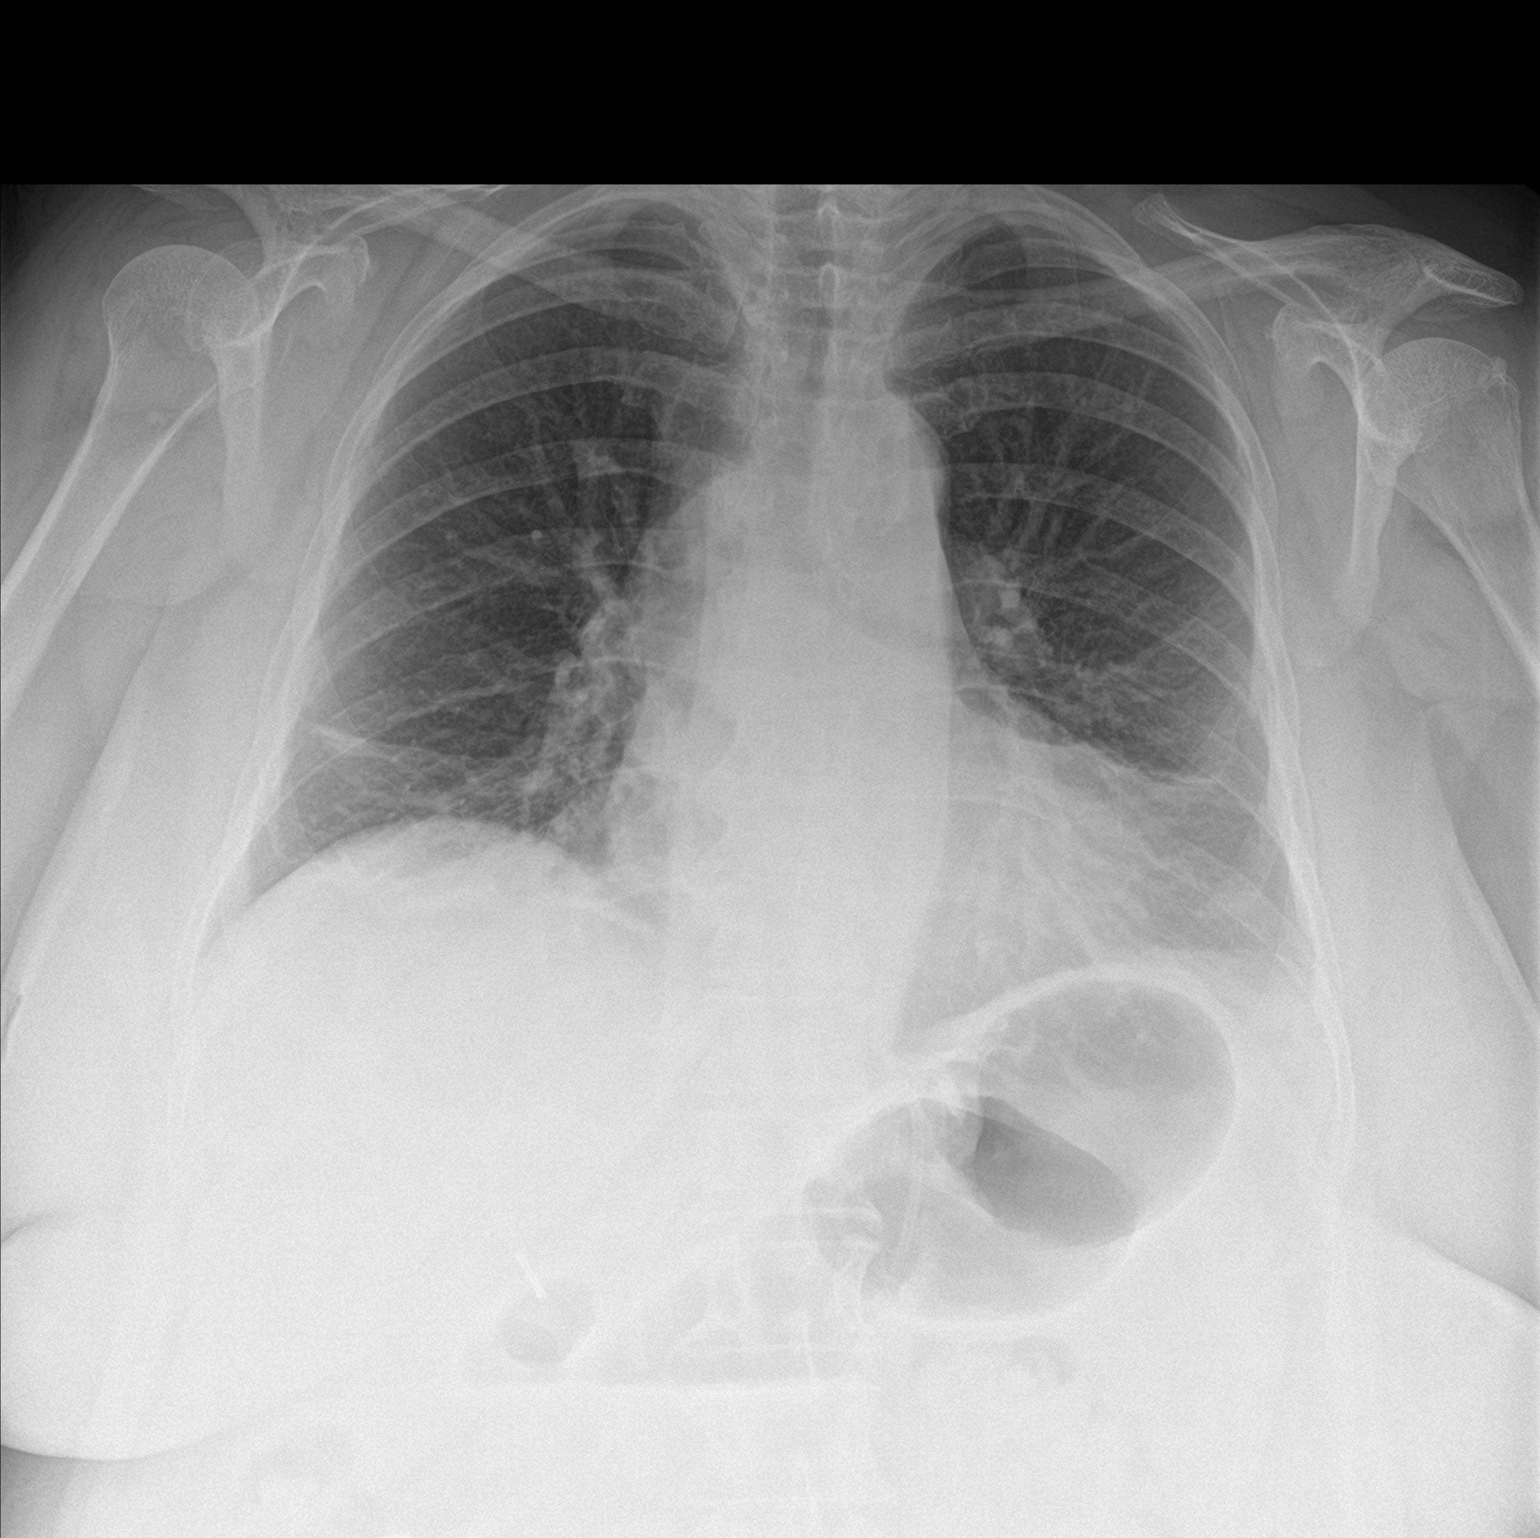

[chest lat]
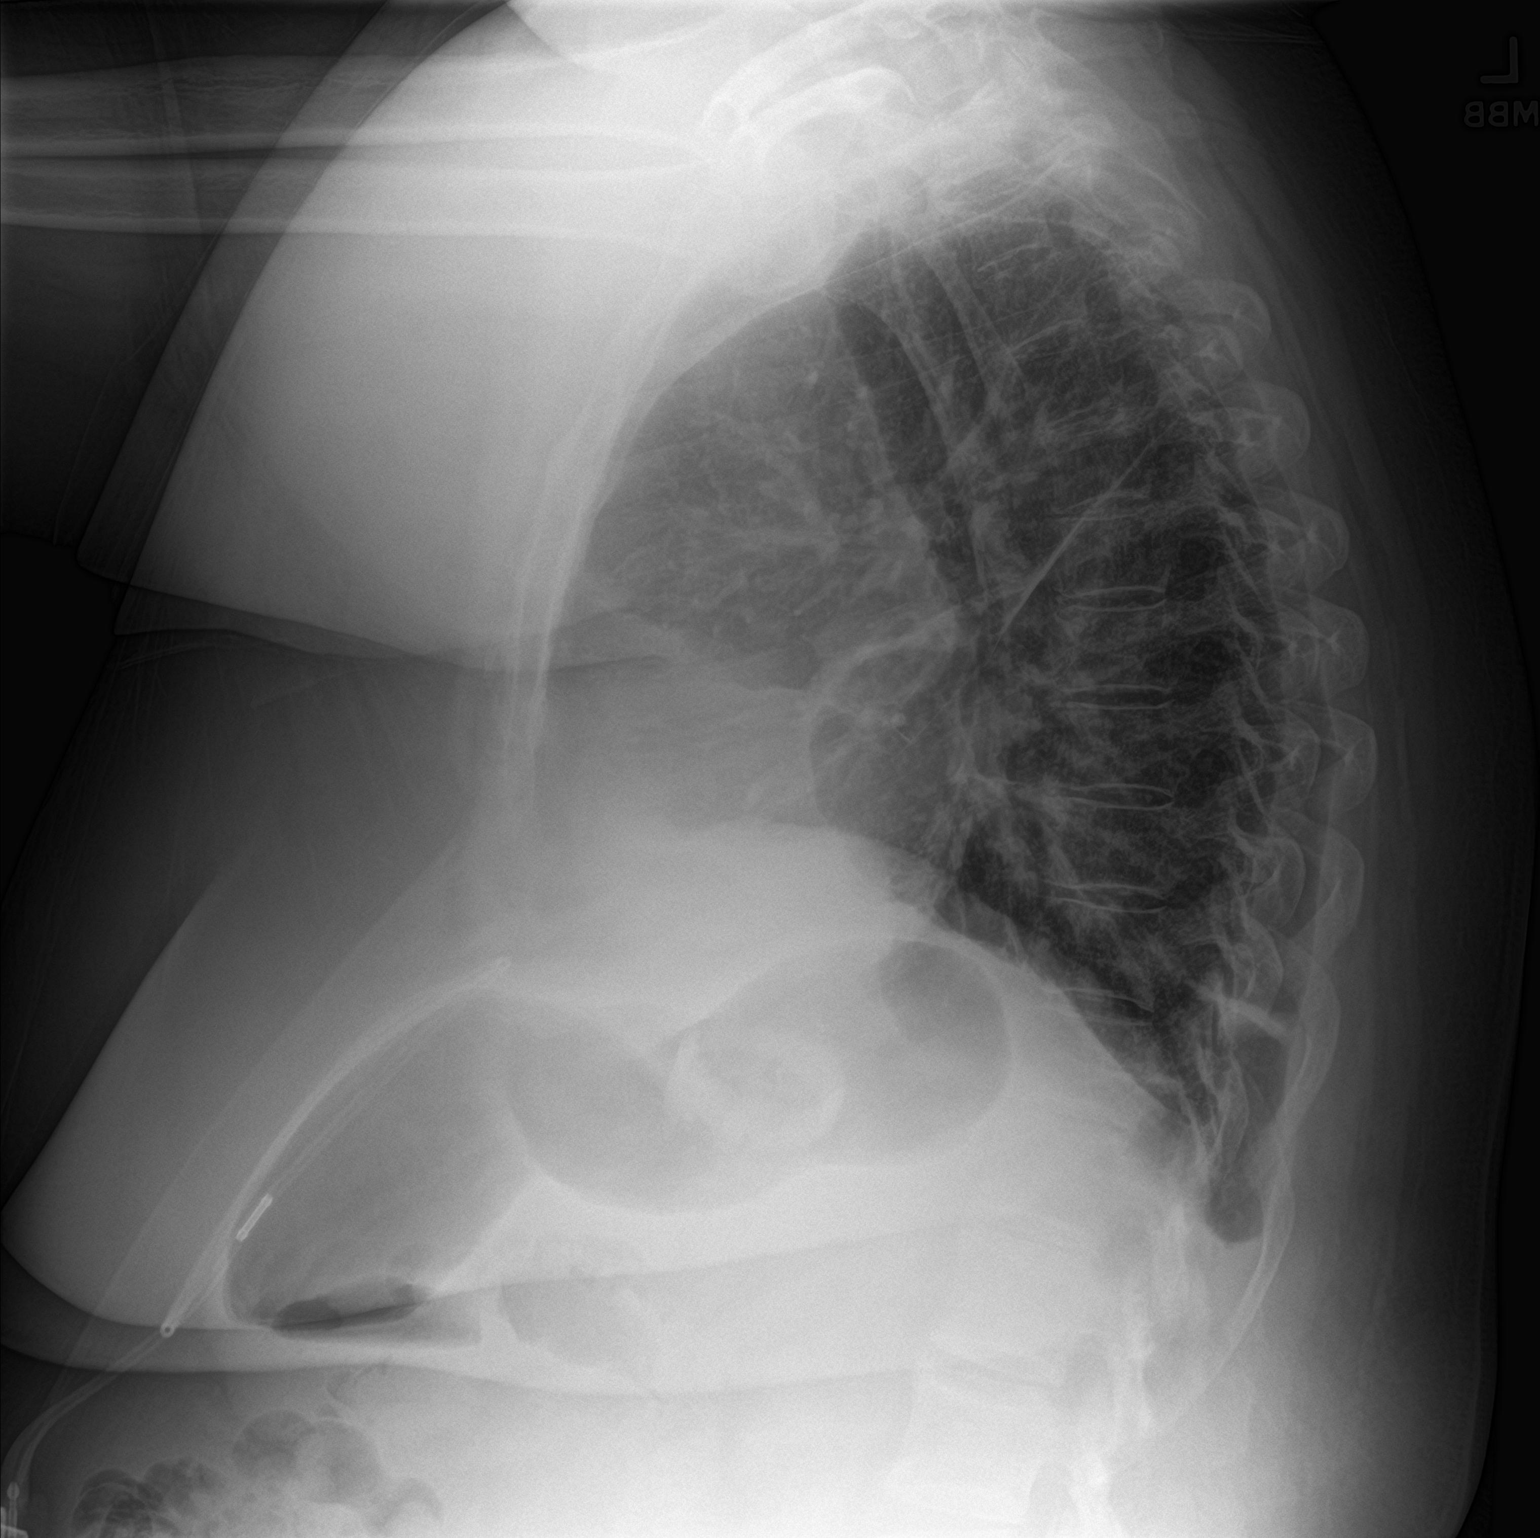

[2 of 2 positions shown; findings below may reference images not displayed]

FINDINGS: Linear airspace opacities in both lower lobes could reflect
atelectasis or infiltrates. No effusions. Heart is normal size. No
acute bony abnormality.
IMPRESSION: Bibasilar atelectasis or infiltrates.

## 2020-08-09 DIAGNOSIS — H10011 Acute follicular conjunctivitis, right eye: Secondary | ICD-10-CM | POA: Diagnosis not present

## 2020-08-28 DIAGNOSIS — R6883 Chills (without fever): Secondary | ICD-10-CM | POA: Diagnosis not present

## 2020-08-28 DIAGNOSIS — Z03818 Encounter for observation for suspected exposure to other biological agents ruled out: Secondary | ICD-10-CM | POA: Diagnosis not present

## 2020-08-28 DIAGNOSIS — R059 Cough, unspecified: Secondary | ICD-10-CM | POA: Diagnosis not present

## 2020-08-28 DIAGNOSIS — B349 Viral infection, unspecified: Secondary | ICD-10-CM | POA: Diagnosis not present

## 2020-08-28 DIAGNOSIS — R52 Pain, unspecified: Secondary | ICD-10-CM | POA: Diagnosis not present

## 2020-09-02 DIAGNOSIS — R059 Cough, unspecified: Secondary | ICD-10-CM | POA: Diagnosis not present

## 2020-09-02 DIAGNOSIS — R490 Dysphonia: Secondary | ICD-10-CM | POA: Diagnosis not present

## 2020-09-02 DIAGNOSIS — R0989 Other specified symptoms and signs involving the circulatory and respiratory systems: Secondary | ICD-10-CM | POA: Diagnosis not present

## 2020-09-02 DIAGNOSIS — Z6841 Body Mass Index (BMI) 40.0 and over, adult: Secondary | ICD-10-CM | POA: Diagnosis not present

## 2020-09-13 DIAGNOSIS — H5213 Myopia, bilateral: Secondary | ICD-10-CM | POA: Diagnosis not present

## 2020-09-19 DIAGNOSIS — G4733 Obstructive sleep apnea (adult) (pediatric): Secondary | ICD-10-CM | POA: Diagnosis not present

## 2020-12-17 DIAGNOSIS — A0811 Acute gastroenteropathy due to Norwalk agent: Secondary | ICD-10-CM | POA: Diagnosis not present

## 2020-12-17 DIAGNOSIS — R109 Unspecified abdominal pain: Secondary | ICD-10-CM | POA: Diagnosis not present

## 2021-01-03 DIAGNOSIS — G4733 Obstructive sleep apnea (adult) (pediatric): Secondary | ICD-10-CM | POA: Diagnosis not present

## 2021-01-16 DIAGNOSIS — M5412 Radiculopathy, cervical region: Secondary | ICD-10-CM | POA: Diagnosis not present

## 2021-01-16 DIAGNOSIS — M9902 Segmental and somatic dysfunction of thoracic region: Secondary | ICD-10-CM | POA: Diagnosis not present

## 2021-01-16 DIAGNOSIS — M9903 Segmental and somatic dysfunction of lumbar region: Secondary | ICD-10-CM | POA: Diagnosis not present

## 2021-01-16 DIAGNOSIS — M9901 Segmental and somatic dysfunction of cervical region: Secondary | ICD-10-CM | POA: Diagnosis not present

## 2021-01-17 DIAGNOSIS — M9901 Segmental and somatic dysfunction of cervical region: Secondary | ICD-10-CM | POA: Diagnosis not present

## 2021-01-17 DIAGNOSIS — M9902 Segmental and somatic dysfunction of thoracic region: Secondary | ICD-10-CM | POA: Diagnosis not present

## 2021-01-17 DIAGNOSIS — M9903 Segmental and somatic dysfunction of lumbar region: Secondary | ICD-10-CM | POA: Diagnosis not present

## 2021-01-17 DIAGNOSIS — M5412 Radiculopathy, cervical region: Secondary | ICD-10-CM | POA: Diagnosis not present

## 2021-01-18 DIAGNOSIS — M9902 Segmental and somatic dysfunction of thoracic region: Secondary | ICD-10-CM | POA: Diagnosis not present

## 2021-01-18 DIAGNOSIS — M9903 Segmental and somatic dysfunction of lumbar region: Secondary | ICD-10-CM | POA: Diagnosis not present

## 2021-01-18 DIAGNOSIS — M5412 Radiculopathy, cervical region: Secondary | ICD-10-CM | POA: Diagnosis not present

## 2021-01-18 DIAGNOSIS — M9901 Segmental and somatic dysfunction of cervical region: Secondary | ICD-10-CM | POA: Diagnosis not present

## 2021-01-21 DIAGNOSIS — M9901 Segmental and somatic dysfunction of cervical region: Secondary | ICD-10-CM | POA: Diagnosis not present

## 2021-01-21 DIAGNOSIS — M9902 Segmental and somatic dysfunction of thoracic region: Secondary | ICD-10-CM | POA: Diagnosis not present

## 2021-01-21 DIAGNOSIS — M9903 Segmental and somatic dysfunction of lumbar region: Secondary | ICD-10-CM | POA: Diagnosis not present

## 2021-01-21 DIAGNOSIS — M5412 Radiculopathy, cervical region: Secondary | ICD-10-CM | POA: Diagnosis not present

## 2021-01-23 DIAGNOSIS — S336XXA Sprain of sacroiliac joint, initial encounter: Secondary | ICD-10-CM | POA: Diagnosis not present

## 2021-01-23 DIAGNOSIS — M62830 Muscle spasm of back: Secondary | ICD-10-CM | POA: Diagnosis not present

## 2021-01-23 DIAGNOSIS — M5412 Radiculopathy, cervical region: Secondary | ICD-10-CM | POA: Diagnosis not present

## 2021-01-25 DIAGNOSIS — M9901 Segmental and somatic dysfunction of cervical region: Secondary | ICD-10-CM | POA: Diagnosis not present

## 2021-01-25 DIAGNOSIS — I10 Essential (primary) hypertension: Secondary | ICD-10-CM | POA: Diagnosis not present

## 2021-01-25 DIAGNOSIS — Z0000 Encounter for general adult medical examination without abnormal findings: Secondary | ICD-10-CM | POA: Diagnosis not present

## 2021-01-25 DIAGNOSIS — M62830 Muscle spasm of back: Secondary | ICD-10-CM | POA: Diagnosis not present

## 2021-01-25 DIAGNOSIS — Z13220 Encounter for screening for lipoid disorders: Secondary | ICD-10-CM | POA: Diagnosis not present

## 2021-01-25 DIAGNOSIS — M9902 Segmental and somatic dysfunction of thoracic region: Secondary | ICD-10-CM | POA: Diagnosis not present

## 2021-01-25 DIAGNOSIS — M9903 Segmental and somatic dysfunction of lumbar region: Secondary | ICD-10-CM | POA: Diagnosis not present

## 2021-01-25 DIAGNOSIS — E559 Vitamin D deficiency, unspecified: Secondary | ICD-10-CM | POA: Diagnosis not present

## 2021-01-25 DIAGNOSIS — F329 Major depressive disorder, single episode, unspecified: Secondary | ICD-10-CM | POA: Diagnosis not present

## 2021-01-25 DIAGNOSIS — M5412 Radiculopathy, cervical region: Secondary | ICD-10-CM | POA: Diagnosis not present

## 2021-01-25 DIAGNOSIS — E538 Deficiency of other specified B group vitamins: Secondary | ICD-10-CM | POA: Diagnosis not present

## 2021-01-25 DIAGNOSIS — S336XXA Sprain of sacroiliac joint, initial encounter: Secondary | ICD-10-CM | POA: Diagnosis not present

## 2021-01-28 DIAGNOSIS — M62830 Muscle spasm of back: Secondary | ICD-10-CM | POA: Diagnosis not present

## 2021-01-28 DIAGNOSIS — M5412 Radiculopathy, cervical region: Secondary | ICD-10-CM | POA: Diagnosis not present

## 2021-01-28 DIAGNOSIS — S336XXA Sprain of sacroiliac joint, initial encounter: Secondary | ICD-10-CM | POA: Diagnosis not present

## 2021-01-31 DIAGNOSIS — S39012A Strain of muscle, fascia and tendon of lower back, initial encounter: Secondary | ICD-10-CM | POA: Diagnosis not present

## 2021-02-06 DIAGNOSIS — I10 Essential (primary) hypertension: Secondary | ICD-10-CM | POA: Diagnosis not present

## 2021-02-06 DIAGNOSIS — G2581 Restless legs syndrome: Secondary | ICD-10-CM | POA: Diagnosis not present

## 2021-02-06 DIAGNOSIS — M109 Gout, unspecified: Secondary | ICD-10-CM | POA: Diagnosis not present

## 2021-02-06 DIAGNOSIS — Z0000 Encounter for general adult medical examination without abnormal findings: Secondary | ICD-10-CM | POA: Diagnosis not present

## 2021-02-25 DIAGNOSIS — R635 Abnormal weight gain: Secondary | ICD-10-CM | POA: Diagnosis not present

## 2021-02-25 DIAGNOSIS — I10 Essential (primary) hypertension: Secondary | ICD-10-CM | POA: Diagnosis not present

## 2021-02-25 DIAGNOSIS — E559 Vitamin D deficiency, unspecified: Secondary | ICD-10-CM | POA: Diagnosis not present

## 2021-02-25 DIAGNOSIS — N951 Menopausal and female climacteric states: Secondary | ICD-10-CM | POA: Diagnosis not present

## 2021-03-04 DIAGNOSIS — Z1331 Encounter for screening for depression: Secondary | ICD-10-CM | POA: Diagnosis not present

## 2021-03-04 DIAGNOSIS — Z1339 Encounter for screening examination for other mental health and behavioral disorders: Secondary | ICD-10-CM | POA: Diagnosis not present

## 2021-03-04 DIAGNOSIS — N951 Menopausal and female climacteric states: Secondary | ICD-10-CM | POA: Diagnosis not present

## 2021-03-04 DIAGNOSIS — R7309 Other abnormal glucose: Secondary | ICD-10-CM | POA: Diagnosis not present

## 2021-03-04 DIAGNOSIS — R7989 Other specified abnormal findings of blood chemistry: Secondary | ICD-10-CM | POA: Diagnosis not present

## 2021-03-04 DIAGNOSIS — E559 Vitamin D deficiency, unspecified: Secondary | ICD-10-CM | POA: Diagnosis not present

## 2021-03-18 DIAGNOSIS — I10 Essential (primary) hypertension: Secondary | ICD-10-CM | POA: Diagnosis not present

## 2021-03-18 DIAGNOSIS — Z6841 Body Mass Index (BMI) 40.0 and over, adult: Secondary | ICD-10-CM | POA: Diagnosis not present

## 2021-03-25 DIAGNOSIS — E559 Vitamin D deficiency, unspecified: Secondary | ICD-10-CM | POA: Diagnosis not present

## 2021-03-25 DIAGNOSIS — Z6841 Body Mass Index (BMI) 40.0 and over, adult: Secondary | ICD-10-CM | POA: Diagnosis not present

## 2021-04-08 DIAGNOSIS — Z6841 Body Mass Index (BMI) 40.0 and over, adult: Secondary | ICD-10-CM | POA: Diagnosis not present

## 2021-04-08 DIAGNOSIS — E559 Vitamin D deficiency, unspecified: Secondary | ICD-10-CM | POA: Diagnosis not present

## 2021-04-08 DIAGNOSIS — I10 Essential (primary) hypertension: Secondary | ICD-10-CM | POA: Diagnosis not present

## 2021-04-17 DIAGNOSIS — L821 Other seborrheic keratosis: Secondary | ICD-10-CM | POA: Diagnosis not present

## 2021-04-17 DIAGNOSIS — L918 Other hypertrophic disorders of the skin: Secondary | ICD-10-CM | POA: Diagnosis not present

## 2021-06-25 NOTE — Progress Notes (Deleted)
PATIENT: Gabrielle Moore DOB: 01/27/1956  REASON FOR VISIT: follow up HISTORY FROM: patient PRIMARY NEUROLOGIST:   HISTORY OF PRESENT ILLNESS: Today 06/26/21  HISTORY   REVIEW OF SYSTEMS: Out of a complete 14 system review of symptoms, the patient complains only of the following symptoms, and all other reviewed systems are negative.  ALLERGIES: Allergies  Allergen Reactions   Codeine Nausea Only   Keflex [Cephalexin] Nausea Only    HOME MEDICATIONS: Outpatient Medications Prior to Visit  Medication Sig Dispense Refill   TURMERIC PO Take by mouth daily.     allopurinol (ZYLOPRIM) 300 MG tablet Take 1 tablet (300 mg total) by mouth daily. 30 tablet 3   Cholecalciferol (VITAMIN D) 50 MCG (2000 UT) CAPS Take 2,000 Units by mouth daily.     Cyanocobalamin (B-12) 3000 MCG CAPS Take 3,000 mcg by mouth daily.     loratadine (CLARITIN) 10 MG tablet Take 10 mg by mouth daily.     NON FORMULARY Pt uses cpap nightly     ondansetron (ZOFRAN-ODT) 4 MG disintegrating tablet Take 1 tablet (4 mg total) by mouth every 6 (six) hours as needed for nausea or vomiting. 20 tablet 0   oxyCODONE (OXY IR/ROXICODONE) 5 MG immediate release tablet Take 1 tablet (5 mg total) by mouth every 6 (six) hours as needed for severe pain. 10 tablet 0   valsartan-hydrochlorothiazide (DIOVAN-HCT) 160-12.5 MG tablet Take 1 tablet by mouth daily.     gabapentin (NEURONTIN) 300 MG capsule Take 300 mg by mouth at bedtime.     No facility-administered medications prior to visit.    PAST MEDICAL HISTORY: Past Medical History:  Diagnosis Date   Arthritis    hands   Asthma    as a child   Gout    Hernia, umbilical    Hypertension    Joint pain    Pre-diabetes    "LABELED AS PRE" -- TAKES NO MEDS   Seasonal allergies    Sleep apnea     PAST SURGICAL HISTORY: Past Surgical History:  Procedure Laterality Date   BREAST SURGERY  1998   breast reduction   CESAREAN SECTION  1996   HERNIA REPAIR      LAPAROSCOPIC GASTRIC BANDING  2009   LAPAROSCOPIC GASTRIC BANDING  2009   SHOULDER SURGERY     SINUS ENDO W/FUSION Left 05/26/2018   Procedure: ENDOSCOPIC SINUS SURGERY WITH NAVIGATION;  Surgeon: Jerrell Belfast, MD;  Location: [facility] OR;  Service: ENT;  Laterality: Left;   TONSILLECTOMY AND ADENOIDECTOMY     TURBINATE REDUCTION Bilateral 05/26/2018   Procedure: TURBINATE REDUCTION;  Surgeon: Jerrell Belfast, MD;  Location: [place];  Service: ENT;  Laterality: Bilateral;    FAMILY HISTORY: Family History  Problem Relation Age of Onset   Hyperlipidemia Mother    Hypertension Mother    Hyperlipidemia Father    Hypertension Father    Heart disease Father 68   Heart attack Father    Depression Father    Stroke Father    Heart disease Other        parent    Sleep apnea Neg Hx     SOCIAL HISTORY: Social History   Socioeconomic History   Marital status: Married    Spouse name: Jori Moll   Number of children: Not on file   Years of education: Not on file   Highest education level: Not on file  Occupational History   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[place] [employer]  Tobacco Use   Smoking status: Never   Smokeless tobacco: Never  Vaping Use   Vaping Use: Never used  Substance and Sexual Activity   Alcohol use: No   Drug use: No   Sexual activity: Not on file  Other Topics Concern   Not on file  Social History Narrative   Not on file   Social Determinants of Health   Financial Resource Strain: Not on file  Food Insecurity: Not on file  Transportation Needs: Not on file  Physical Activity: Not on file  Stress: Not on file  Social Connections: Not on file  Intimate Partner Violence: Not on file      PHYSICAL EXAM  Vitals:   06/26/21 1459  BP: 121/79  Pulse: 81  Weight: 270 lb 6.4 oz (122.7 kg)  Height: 5\' 6"  (1.676 m)   Body mass index is 43.64 kg/m.  Generalized: Well developed, in no acute distress   Neurological examination  Mentation:  Alert oriented to time, place, history taking. Follows all commands speech and language fluent Cranial nerve II-XII: Pupils were equal round reactive to light. Extraocular movements were full, visual field were full on confrontational test. Facial sensation and strength were normal. Uvula tongue midline. Head turning and shoulder shrug  were normal and symmetric. Motor: The motor testing reveals 5 over 5 strength of all 4 extremities. Good symmetric motor tone is noted throughout.  Sensory: Sensory testing is intact to soft touch on all 4 extremities. No evidence of extinction is noted.  Coordination: Cerebellar testing reveals good finger-nose-finger and heel-to-shin bilaterally.  Gait and station: Gait is normal. Tandem gait is normal. Romberg is negative. No drift is seen.  Reflexes: Deep tendon reflexes are symmetric and normal bilaterally.   DIAGNOSTIC DATA (LABS, IMAGING, TESTING) - I reviewed patient records, labs, notes, testing and imaging myself where available.  Lab Results  Component Value Date   WBC 6.9 06/28/2020   HGB 14.8 06/28/2020   HCT 44.4 06/28/2020   MCV 93.1 06/28/2020   PLT 235 06/28/2020      Component Value Date/Time   NA 142 06/28/2020 1153   NA 142 09/28/2018 1556   K 3.7 06/28/2020 1153   CL 101 06/28/2020 1153   CO2 28 06/28/2020 1153   GLUCOSE 105 (H) 06/28/2020 1153   BUN 20 06/28/2020 1153   BUN 12 09/28/2018 1556   CREATININE 0.70 06/28/2020 1153   CREATININE 0.62 12/18/2017 1035   CALCIUM 9.6 06/28/2020 1153   PROT 7.3 06/15/2020 1754   PROT 7.4 09/28/2018 1556   ALBUMIN 4.0 06/15/2020 1754   ALBUMIN 4.2 09/28/2018 1556   AST 24 06/15/2020 1754   ALT 29 06/15/2020 1754   ALKPHOS 66 06/15/2020 1754   BILITOT 0.5 06/15/2020 1754   BILITOT 0.4 09/28/2018 1556   GFRNONAA >60 06/28/2020 1153   GFRAA 109 09/28/2018 1556   Lab Results  Component Value Date   CHOL 171 09/28/2018   HDL 56 09/28/2018   LDLCALC 93 09/28/2018   TRIG 108  09/28/2018   CHOLHDL 4 09/07/2017   Lab Results  Component Value Date   HGBA1C 6.1 (H) 09/28/2018   No results found for: HQIONGEX52 Lab Results  Component Value Date   TSH 2.960 09/28/2018      ASSESSMENT AND PLAN 65 y.o. year old female  has a past medical history of Arthritis, Asthma, Gout, Hernia, umbilical, Hypertension, Joint pain, Pre-diabetes, Seasonal allergies, and Sleep apnea. here with ***  Ward Givens, MSN, NP-C 06/26/2021, 3:07 PM [facility] [address], [place], [place] [address] [phone]

## 2021-06-26 ENCOUNTER — Ambulatory Visit: Payer: BC Managed Care – PPO | Admitting: Adult Health

## 2021-06-26 ENCOUNTER — Encounter: Payer: Self-pay | Admitting: Adult Health

## 2021-06-26 VITALS — BP 121/79 | HR 81 | Ht 66.0 in | Wt 270.4 lb

## 2021-06-26 DIAGNOSIS — G4733 Obstructive sleep apnea (adult) (pediatric): Secondary | ICD-10-CM

## 2021-06-26 DIAGNOSIS — Z9989 Dependence on other enabling machines and devices: Secondary | ICD-10-CM | POA: Diagnosis not present

## 2021-06-26 NOTE — Progress Notes (Signed)
PATIENT: Gabrielle Moore DOB: 1955/10/29  REASON FOR VISIT: follow up HISTORY FROM: patient   HISTORY OF PRESENT ILLNESS: Today 06/26/21:  Gabrielle Moore is a 65 year old female with a history of obstructive sleep apnea on CPAP.  She reports that the CPAP is working well for her.  She denies any new issues.  Reports that she is still trying to lose weight but has not been successful.    HISTORY 06/25/20:   Gabrielle Moore is a 65 year old female with a history of obstructive sleep apnea on CPAP. Her download indicates that she use her machine 28 out of 30 days for compliance of 93%. She used her machine greater than 4 hours 25 out of 30 days for compliance of 83%. On average she uses her machine 6 hours and 19 minutes. Her residual AHI is 0.3 on 6 to 12 cm of water with EPR of 3. Leak in the 95th percentile is 16.9 L/min. She returns today for follow-up   REVIEW OF SYSTEMS: Out of a complete 14 system review of symptoms, the patient complains only of the following symptoms, and all other reviewed systems are negative.  ESS 8  ALLERGIES: Allergies  Allergen Reactions   Codeine Nausea Only   Keflex [Cephalexin] Nausea Only    HOME MEDICATIONS: Outpatient Medications Prior to Visit  Medication Sig Dispense Refill   TURMERIC PO Take by mouth daily.     allopurinol (ZYLOPRIM) 300 MG tablet Take 1 tablet (300 mg total) by mouth daily. 30 tablet 3   Cholecalciferol (VITAMIN D) 50 MCG (2000 UT) CAPS Take 2,000 Units by mouth daily.     Cyanocobalamin (B-12) 3000 MCG CAPS Take 3,000 mcg by mouth daily.     loratadine (CLARITIN) 10 MG tablet Take 10 mg by mouth daily.     NON FORMULARY Pt uses cpap nightly     ondansetron (ZOFRAN-ODT) 4 MG disintegrating tablet Take 1 tablet (4 mg total) by mouth every 6 (six) hours as needed for nausea or vomiting. 20 tablet 0   oxyCODONE (OXY IR/ROXICODONE) 5 MG immediate release tablet Take 1 tablet (5 mg total) by mouth every 6 (six) hours as  needed for severe pain. 10 tablet 0   valsartan-hydrochlorothiazide (DIOVAN-HCT) 160-12.5 MG tablet Take 1 tablet by mouth daily.     gabapentin (NEURONTIN) 300 MG capsule Take 300 mg by mouth at bedtime.     No facility-administered medications prior to visit.    PAST MEDICAL HISTORY: Past Medical History:  Diagnosis Date   Arthritis    hands   Asthma    as a child   Gout    Hernia, umbilical    Hypertension    Joint pain    Pre-diabetes    "LABELED AS PRE" -- TAKES NO MEDS   Seasonal allergies    Sleep apnea     PAST SURGICAL HISTORY: Past Surgical History:  Procedure Laterality Date   BREAST SURGERY  1998   breast reduction   CESAREAN SECTION  1996   HERNIA REPAIR     LAPAROSCOPIC GASTRIC BANDING  2009   LAPAROSCOPIC GASTRIC BANDING  2009   SHOULDER SURGERY     SINUS ENDO W/FUSION Left 05/26/2018   Procedure: ENDOSCOPIC SINUS SURGERY WITH NAVIGATION;  Surgeon: Jerrell Belfast, MD;  Location: [place];  Service: ENT;  Laterality: Left;   TONSILLECTOMY AND ADENOIDECTOMY     TURBINATE REDUCTION Bilateral 05/26/2018   Procedure: TURBINATE REDUCTION;  Surgeon: Jerrell Belfast, MD;  Location: [facility];  Service: ENT;  Laterality: Bilateral;    FAMILY HISTORY: Family History  Problem Relation Age of Onset   Hyperlipidemia Mother    Hypertension Mother    Hyperlipidemia Father    Hypertension Father    Heart disease Father 92   Heart attack Father    Depression Father    Stroke Father    Heart disease Other        parent    Sleep apnea Neg Hx     SOCIAL HISTORY: Social History   Socioeconomic History   Marital status: Married    Spouse name: Jori Moll   Number of children: Not on file   Years of education: Not on file   Highest education level: Not on file  Occupational History   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[place] [employer]  Tobacco Use   Smoking status: Never   Smokeless tobacco: Never  Vaping Use   Vaping Use: Never used  Substance and  Sexual Activity   Alcohol use: No   Drug use: No   Sexual activity: Not on file  Other Topics Concern   Not on file  Social History Narrative   Not on file   Social Determinants of Health   Financial Resource Strain: Not on file  Food Insecurity: Not on file  Transportation Needs: Not on file  Physical Activity: Not on file  Stress: Not on file  Social Connections: Not on file  Intimate Partner Violence: Not on file      PHYSICAL EXAM  Vitals:   06/26/21 1459  BP: 121/79  Pulse: 81  Weight: 270 lb 6.4 oz (122.7 kg)  Height: 5\' 6"  (1.676 m)   Body mass index is 43.64 kg/m.  Generalized: Well developed, in no acute distress  Chest: Lungs clear to auscultation bilaterally  Neurological examination  Mentation: Alert oriented to time, place, history taking. Follows all commands speech and language fluent Cranial nerve II-XII: Extraocular movements were full, visual field were full on confrontational test Head turning and shoulder shrug  were normal and symmetric. Motor: The motor testing reveals 5 over 5 strength of all 4 extremities. Good symmetric motor tone is noted throughout.  Sensory: Sensory testing is intact to soft touch on all 4 extremities. No evidence of extinction is noted.  Gait and station: Gait is normal.    DIAGNOSTIC DATA (LABS, IMAGING, TESTING) - I reviewed patient records, labs, notes, testing and imaging myself where available.  Lab Results  Component Value Date   WBC 6.9 06/28/2020   HGB 14.8 06/28/2020   HCT 44.4 06/28/2020   MCV 93.1 06/28/2020   PLT 235 06/28/2020      Component Value Date/Time   NA 142 06/28/2020 1153   NA 142 09/28/2018 1556   K 3.7 06/28/2020 1153   CL 101 06/28/2020 1153   CO2 28 06/28/2020 1153   GLUCOSE 105 (H) 06/28/2020 1153   BUN 20 06/28/2020 1153   BUN 12 09/28/2018 1556   CREATININE 0.70 06/28/2020 1153   CREATININE 0.62 12/18/2017 1035   CALCIUM 9.6 06/28/2020 1153   PROT 7.3 06/15/2020 1754    PROT 7.4 09/28/2018 1556   ALBUMIN 4.0 06/15/2020 1754   ALBUMIN 4.2 09/28/2018 1556   AST 24 06/15/2020 1754   ALT 29 06/15/2020 1754   ALKPHOS 66 06/15/2020 1754   BILITOT 0.5 06/15/2020 1754   BILITOT 0.4 09/28/2018 1556   GFRNONAA >60 06/28/2020 1153   GFRAA 109 09/28/2018 1556   Lab Results  Component Value Date   CHOL 171 09/28/2018   HDL 56 09/28/2018   LDLCALC 93 09/28/2018   TRIG 108 09/28/2018   CHOLHDL 4 09/07/2017   Lab Results  Component Value Date   HGBA1C 6.1 (H) 09/28/2018   No results found for: VITAMINB12 Lab Results  Component Value Date   TSH 2.960 09/28/2018      ASSESSMENT AND PLAN 65 y.o. year old female  has a past medical history of Arthritis, Asthma, Gout, Hernia, umbilical, Hypertension, Joint pain, Pre-diabetes, Seasonal allergies, and Sleep apnea. here with:  OSA on CPAP  - CPAP compliance excellent - Good treatment of AHI  - Encourage patient to use CPAP nightly and > 4 hours each night - F/U in 1 year or sooner if needed     Ward Givens, MSN, NP-C 06/26/2021, 3:08 PM [facility] [address] [place], [place] [address] [phone]

## 2021-06-26 NOTE — Patient Instructions (Signed)
Your Plan:  Continue CPAP Read "you can drop it" by Blanche East Muhlstein   Thank you for coming to see [place] at [facility]. I hope we have been able to provide you high quality care today.  You may receive a patient satisfaction survey over the next few weeks. We would appreciate your feedback and comments so that we may continue to improve ourselves and the health of our patients.

## 2021-07-18 DIAGNOSIS — Z23 Encounter for immunization: Secondary | ICD-10-CM | POA: Diagnosis not present

## 2021-08-22 IMAGING — MR MR KNEE*R* WO/W CM
6 of 10 series · 23 of 40 positions shown · IV contrast (20ml Multihance)
Comparison: None.

CLINICAL DATA: Right knee pain and swelling, question of soft
tissue mass

EXAM:
MRI OF THE RIGHT KNEE WITHOUT AND WITH CONTRAST
TECHNIQUE: Multiplanar, multisequence MR imaging of the right was performed
both before and after administration of intravenous contrast.
CONTRAST:  20mL MULTIHANCE GADOBENATE DIMEGLUMINE 529 MG/ML IV SOLN

[Series 3: t2_tse_fs_tra · axial · 4.0mm · 0.50mm/px · z∈[-82,+28]mm · 4 of 24 slices shown]
[im 1/24]
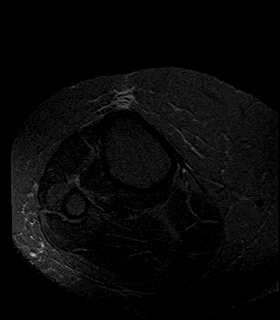
[im 8/24]
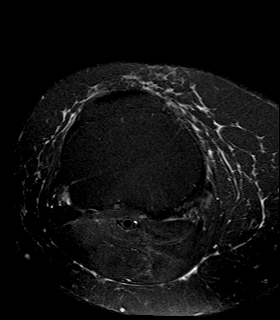
[im 16/24]
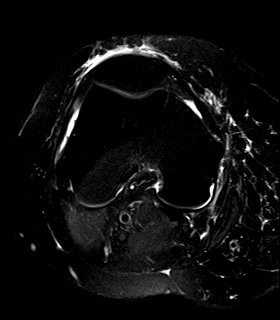
[im 24/24]
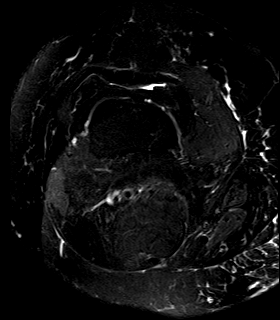

[Series 4: T1 · coronal · 4.0mm · 0.29mm/px · 5 of 28 slices shown]
[im 1/28]
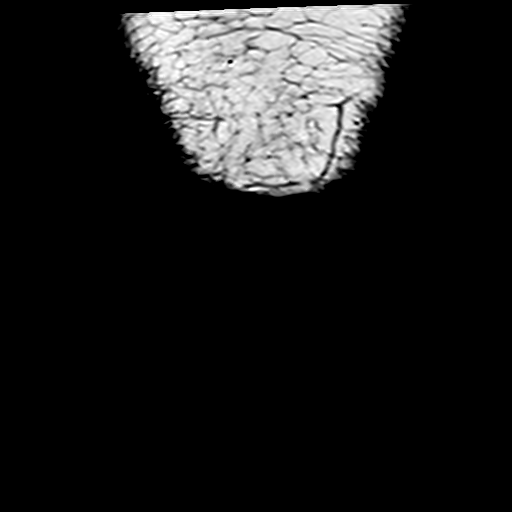
[im 7/28]
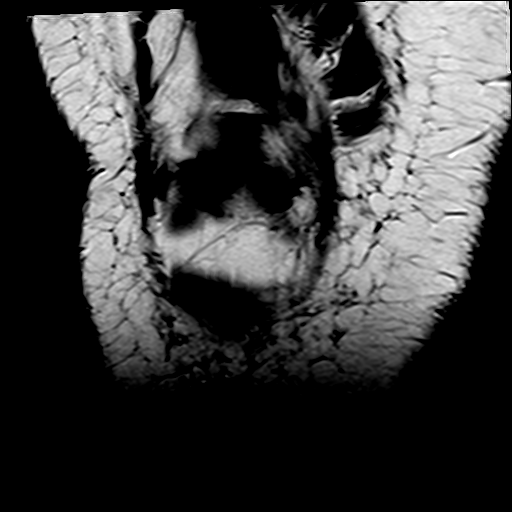
[im 14/28]
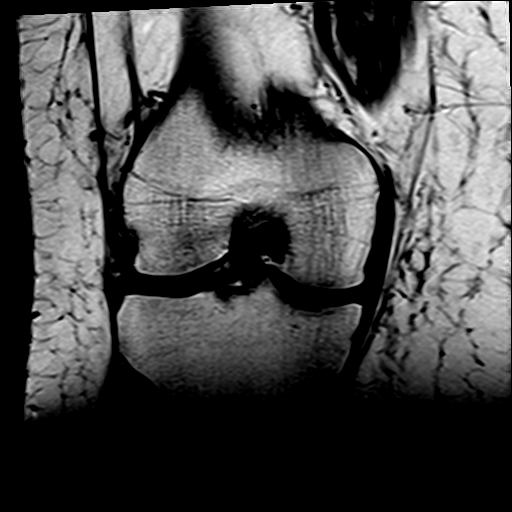
[im 21/28]
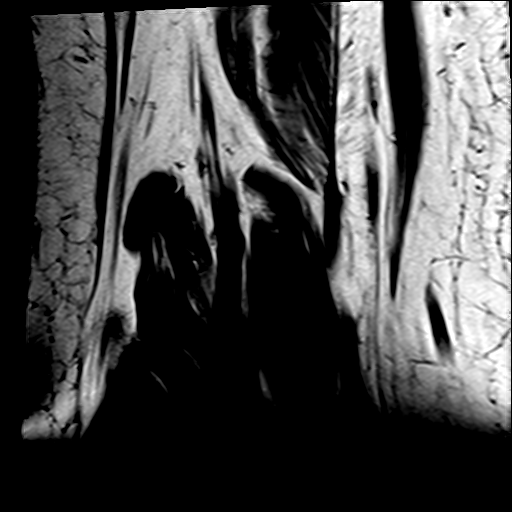
[im 28/28]
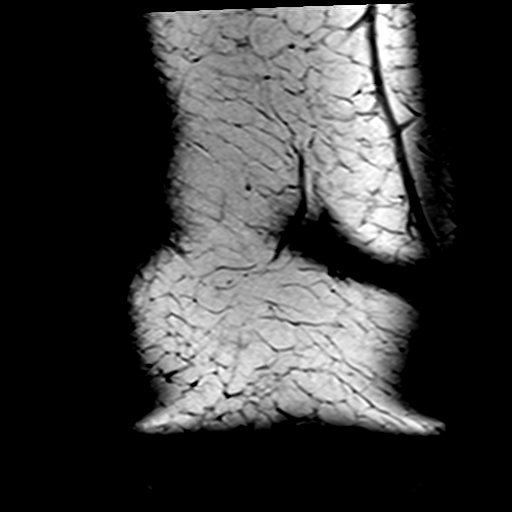

[Series 5: T2 fat-sat · coronal · 4.0mm · 0.59mm/px · 4 of 28 slices shown (1 of 2)]
[im 1/28]
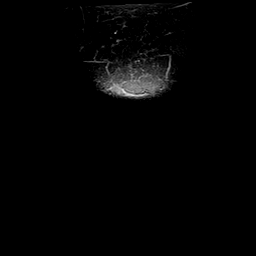
[im 10/28]
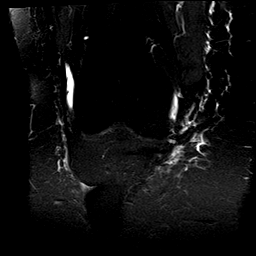
[im 19/28]
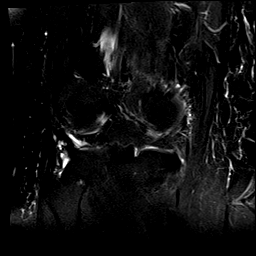
[im 28/28]
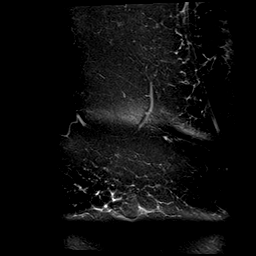

[Series 6: pd_tse_fs_cor, 3mm copy · coronal · 3.0mm · 0.29mm/px · 2 of 36 slices shown]
[im 1/36]
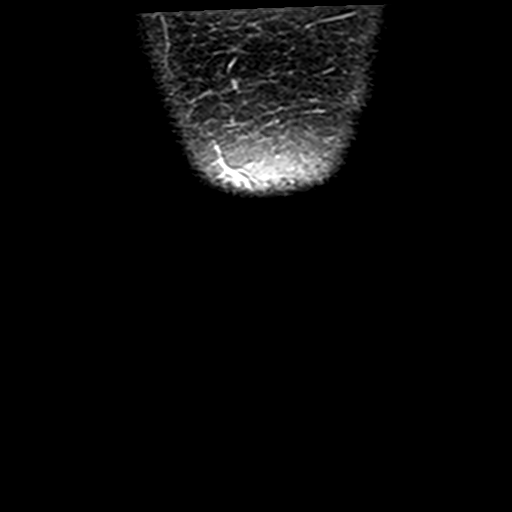
[im 9/36]
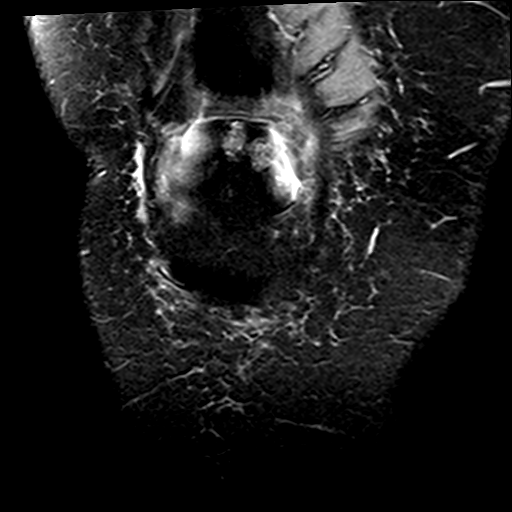

[Series 7: PD fat-sat · sagittal · 3.0mm · 0.29mm/px · 4 of 30 slices shown]
[im 1/30]
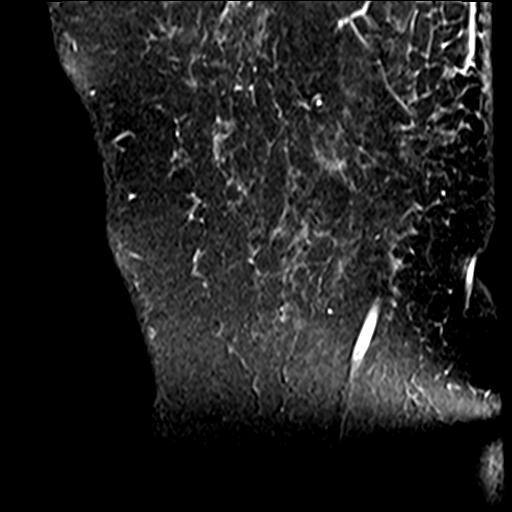
[im 10/30]
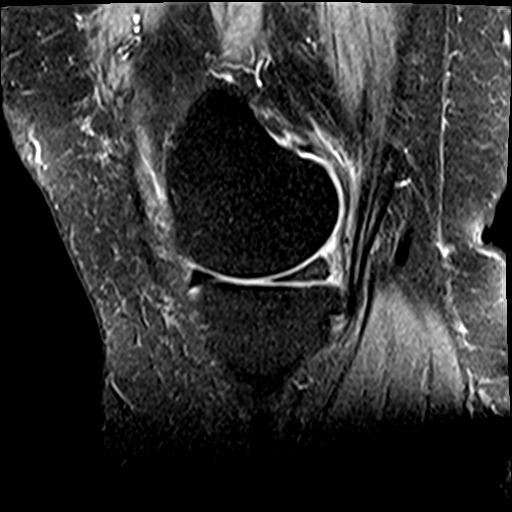
[im 20/30]
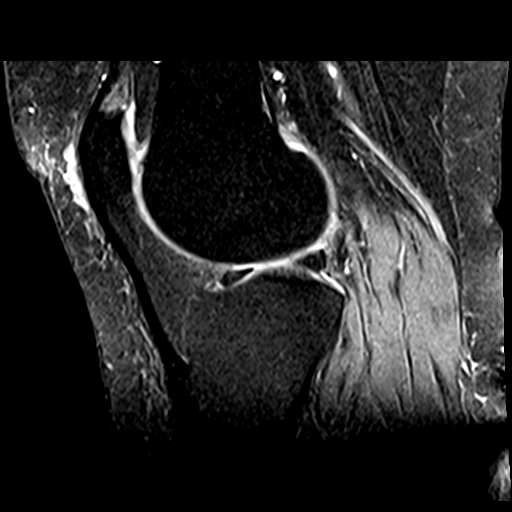
[im 30/30]
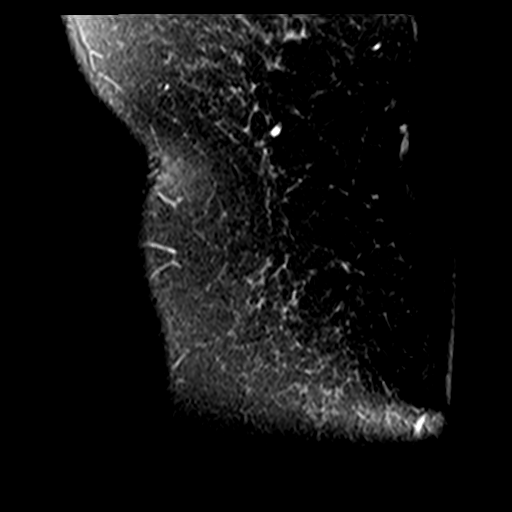

[Series 8: T2 fat-sat · sagittal · 3.0mm · 0.29mm/px · 4 of 30 slices shown (2 of 2)]
[im 1/30]
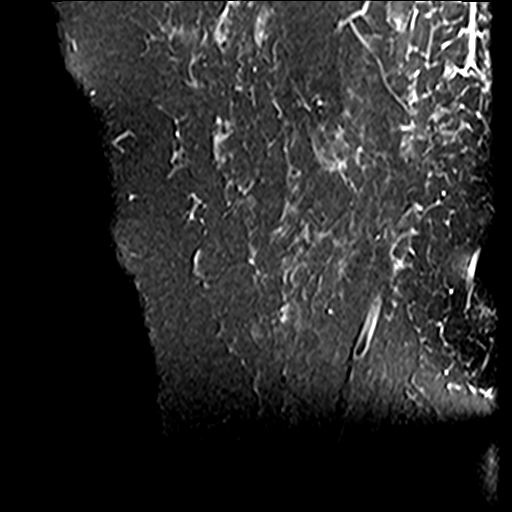
[im 10/30]
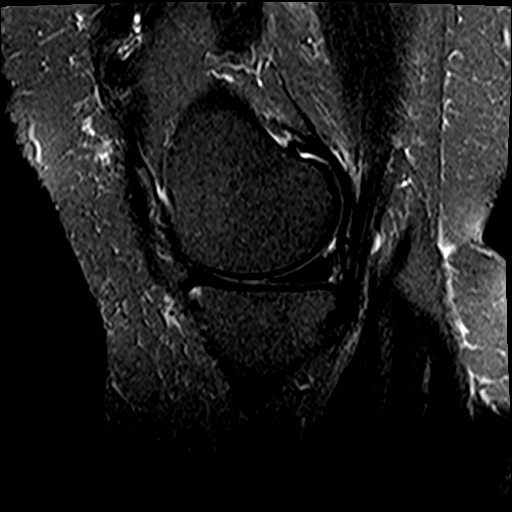
[im 20/30]
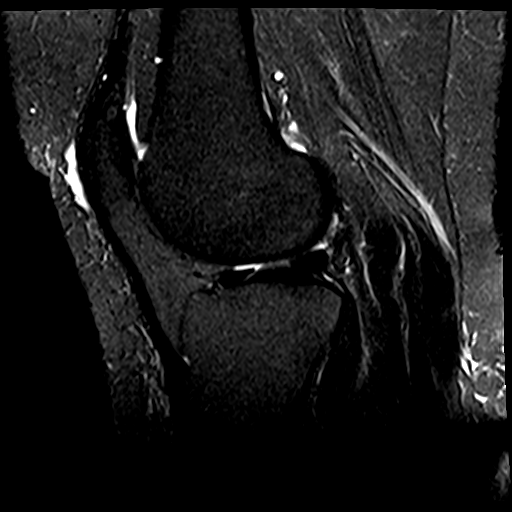
[im 30/30]
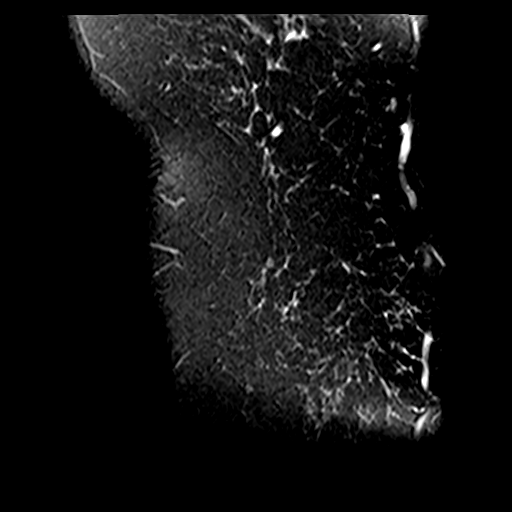

[23 of 40 positions shown; findings below may reference images not displayed]

FINDINGS: MENISCI

Medial: Increased intrasubstance signal seen in the posterior horn
of the medial meniscus, however does not contact the underlying
articular surface.

Lateral: Intact.

LIGAMENTS

Cruciates: The ACL is intact. The PCL is intact.

Collaterals: There is mildly increased signal seen throughout the
superficial fibers of the medial collateral ligament. The lateral
collateral ligamentous complex is intact.

CARTILAGE

Patellofemoral: Areas of deep chondral fissuring is seen within the
central patellar apex and the medial patellar facet with subchondral
cystic changes. There is also areas of chondral fissuring seen
within the central trochlear surface.

Medial compartment: Mild chondral thinning seen the weight-bearing
surface of the medial femoral condyle.

Lateral compartment: Mild chondral thinning seen the weight-bearing
surface of the lateral femoral condyle.

BONES: No fracture. No avascular necrosis. No pathologic marrow
infiltration.

JOINT: Trace knee joint effusion. Normal Jhemboy Padam. No plical
thickening.

EXTENSOR MECHANISM: Mildly increased signal seen at the insertion
site of the quadriceps tendon. The patellar tendon is intact. The
retinaculum is unremarkable.

POPLITEAL FOSSA: No popliteal cyst.

OTHER: There is mildly increased signal seen at the insertion site
of the semimembranosus tendon. Mild prepatellar soft tissue swelling
is seen. There is a small amount of prepatellar non loculated fluid.
No enhancing soft tissue mass or areas of abnormal enhancement are
seen on postcontrast images.
IMPRESSION: 1. No enhancing soft tissue mass or pathologic marrow infiltration.
2. Tricompartmental osteoarthritis, advanced within the
patellofemoral compartment.
3. Intrasubstance degeneration of the posterior medial meniscus,
however it is intact.
4. Intact cruciate ligaments
5. Mild insertional quadriceps and semimembranosus tendinosis
6. Small amount of prepatellar fluid which could be mild prepatellar
bursitis.

## 2021-09-18 DIAGNOSIS — Z23 Encounter for immunization: Secondary | ICD-10-CM | POA: Diagnosis not present

## 2021-09-24 DIAGNOSIS — M79621 Pain in right upper arm: Secondary | ICD-10-CM | POA: Diagnosis not present

## 2021-10-02 DIAGNOSIS — R928 Other abnormal and inconclusive findings on diagnostic imaging of breast: Secondary | ICD-10-CM | POA: Diagnosis not present

## 2021-10-02 DIAGNOSIS — N644 Mastodynia: Secondary | ICD-10-CM | POA: Diagnosis not present

## 2021-10-11 NOTE — Progress Notes (Addendum)
Cardiology Office Note:    Date:  10/14/2021   ID:  Gabrielle Moore, DOB [birth year], MRN [id number]  PCP:  Lawerance Cruel, MD  Cardiologist:  Buford Dresser, MD  Referring MD: Lawerance Cruel, MD   CC: new patient consultation for aortic atherolsclerosis  History of Present Illness:    Gabrielle Moore is a 66 y.o. female with a hx of hypertension, OSA on CPAP, asthma, arthritis, and gout, who is seen as a new consult at the request of Lawerance Cruel, MD for the evaluation and management of CAD and aortic atherosclerosis noted on CXR.  Note from 09/24/21 with Dr. Sabra Heck previewed. Presented with pain in R chest wall. referred for mammogram and chest x-ray. X-ray showed aortic atherosclerosis. Referred for further evaluation.  Cardiovascular risk factors: Prior clinical ASCVD: None Comorbid conditions: Hypertension diagnosed 25 years ago, on antihypertensives. Her LDL was 107 in 01/2021, never medically controlled. She confirms pre-diabetes ongoing for years. Metabolic syndrome/Obesity: Highest adult weight is current weight 276 lbs. Chronic inflammatory conditions: Tobacco use history: Never smoked. She endorses second-hand smoke inhalation, she lived with a smoker 36 years ago. Family history: Her father had a massive heart attack at 67 yo, died of another massive heart attack at 65 yo. Her paternal grandmother had diabetes. Her brother recently started medication for diabetes. Significant history of Alzheimer's in her maternal family (mother, grandmother, and others). Prior cardiac testing and/or incidental findings on other testing (ie coronary calcium): Recently referred for CXR indicated by right-sided costal pain which revealed aortic atherosclerosis. Exercise level: No formal exercise with her busy schedule. She works from home 4 days a week due to [place] restrictions. Also visits her mother who has Alzheimer's at least 4 days a week. Current diet: Occasionally she  will eat when feeling bored, but she knows that she is full. Taking CoQ 10 and probiotic supplements. She is willing to try a mediterranean diet.  Initially her right-sided costal/chest pain started in 07/2021 with a "stabbing" quality. Her pain is exacerbated when she is getting out of bed or twisting her torso laterally. She also complains of pain between her shoulder blades and indigestion.  Of note, she has a phobia of needles and is hesitant to use self-injecting medications.  She denies any palpitations, or shortness of breath. No lightheadedness, headaches, syncope, orthopnea, PND, lower extremity edema or exertional symptoms. She is fasting this morning.  Past Medical History:  Diagnosis Date   Arthritis    hands   Asthma    as a child   Gout    Hernia, umbilical    Hypertension    Joint pain    Pre-diabetes    "LABELED AS PRE" -- TAKES NO MEDS   Seasonal allergies    Sleep apnea     Past Surgical History:  Procedure Laterality Date   BREAST SURGERY  1998   breast reduction   CESAREAN SECTION  1996   HERNIA REPAIR     LAPAROSCOPIC GASTRIC BANDING  2009   LAPAROSCOPIC GASTRIC BANDING  2009   SHOULDER SURGERY     SINUS ENDO W/FUSION Left 05/26/2018   Procedure: ENDOSCOPIC SINUS SURGERY WITH NAVIGATION;  Surgeon: Jerrell Belfast, MD;  Location: [place];  Service: ENT;  Laterality: Left;   TONSILLECTOMY AND ADENOIDECTOMY     TURBINATE REDUCTION Bilateral 05/26/2018   Procedure: TURBINATE REDUCTION;  Surgeon: Jerrell Belfast, MD;  Location: [facility];  Service: ENT;  Laterality: Bilateral;    Current Medications: Current Outpatient  Medications on File Prior to Visit  Medication Sig   allopurinol (ZYLOPRIM) 300 MG tablet Take 1 tablet (300 mg total) by mouth daily.   Cholecalciferol (VITAMIN D) 50 MCG (2000 UT) CAPS Take 2,000 Units by mouth daily.   co-enzyme Q-10 30 MG capsule Take 200 mg by mouth daily.   Cyanocobalamin (B-12) 3000 MCG CAPS Take 3,000 mcg by mouth daily.    loratadine (CLARITIN) 10 MG tablet Take 10 mg by mouth daily.   NON FORMULARY Pt uses cpap nightly   ondansetron (ZOFRAN-ODT) 4 MG disintegrating tablet Take 1 tablet (4 mg total) by mouth every 6 (six) hours as needed for nausea or vomiting.   oxyCODONE (OXY IR/ROXICODONE) 5 MG immediate release tablet Take 1 tablet (5 mg total) by mouth every 6 (six) hours as needed for severe pain.   Probiotic Product (PROBIOTIC-10) CHEW Chew by mouth daily.   TURMERIC PO Take by mouth daily.   valsartan-hydrochlorothiazide (DIOVAN-HCT) 160-12.5 MG tablet Take 1 tablet by mouth daily.   Vitamin D-Vitamin K (VITAMIN K2-VITAMIN D3 PO) Take 2,000 Units by mouth daily.   No current facility-administered medications on file prior to visit.     Allergies:   Codeine and Keflex [cephalexin]   Social History   Tobacco Use   Smoking status: Never   Smokeless tobacco: Never  Vaping Use   Vaping Use: Never used  Substance Use Topics   Alcohol use: No   Drug use: No    Family History: family history includes Depression in her father; Heart attack in her father; Heart disease in an other family member; Heart disease (age of onset: 20) in her father; Hyperlipidemia in her father and mother; Hypertension in her father and mother; Stroke in her father. There is no history of Sleep apnea.  ROS:   Please see the history of present illness.  Additional pertinent ROS: Constitutional: Negative for chills, fever, night sweats, unintentional weight loss  HENT: Negative for ear pain and hearing loss.   Eyes: Negative for loss of vision and eye pain.  Respiratory: Negative for cough, sputum, wheezing.   Cardiovascular: See HPI. Gastrointestinal: Negative for abdominal pain, melena, and hematochezia. Positive for indigestion. Genitourinary: Negative for dysuria and hematuria.  Musculoskeletal: Negative for falls and myalgias. Positive for pain between shoulder blades. Skin: Negative for itching and rash.   Neurological: Negative for focal weakness, focal sensory changes and loss of consciousness.  Endo/Heme/Allergies: Does not bruise/bleed easily.     EKGs/Labs/Other Studies Reviewed:    The following studies were reviewed today:  CT Abdomen/Pelvis 06/15/2020: FINDINGS: Lower chest: Lung bases demonstrate linear atelectasis. Borderline cardiomegaly.   Hepatobiliary: No focal liver abnormality is seen. No gallstones, gallbladder wall thickening, or biliary dilatation.   Pancreas: Unremarkable. No pancreatic ductal dilatation or surrounding inflammatory changes.   Spleen: Normal in size without focal abnormality.   Adrenals/Urinary Tract: Adrenal glands are unremarkable. Kidneys are normal, without renal calculi, focal lesion, or hydronephrosis. Bladder is unremarkable.   Stomach/Bowel: Status post laparoscopic gastric banding with tubing in the anterior abdomen and reservoir or in the subcutaneous fat of the right anterior abdominal wall. No dilated small bowel. No bowel wall thickening. Negative appendix.   Vascular/Lymphatic: No significant vascular findings are present. No enlarged abdominal or pelvic lymph nodes.   Reproductive: Uterus and bilateral adnexa are unremarkable.   Other: No free air or significant free fluid. Moderate fat containing umbilical hernia without significant edema or fluid in the sac. No bowel containing ventral hernia.  Musculoskeletal: No acute or significant osseous findings.   IMPRESSION: 1. No CT evidence for acute intra-abdominal or pelvic abnormality. 2. Moderate fat containing umbilical hernia without inflammatory change, fluid or other suspicious features. 3. Status post laparoscopic gastric banding. Negative for a bowel obstruction.  EKG:  EKG is personally reviewed.   10/14/2021: NSR at 66 bpm  Recent Labs: No results found for requested labs within last 8760 hours.   Recent Lipid Panel    Component Value Date/Time   CHOL 171  09/28/2018 1556   TRIG 108 09/28/2018 1556   HDL 56 09/28/2018 1556   CHOLHDL 4 09/07/2017 0827   VLDL 26.2 09/07/2017 0827   LDLCALC 93 09/28/2018 1556    Physical Exam:    VS:  BP 124/78    Pulse 66    Ht 5\' 6"  (1.676 m)    Wt 276 lb (125.2 kg)    SpO2 97%    BMI 44.55 kg/m     Wt Readings from Last 3 Encounters:  10/14/21 276 lb (125.2 kg)  06/26/21 270 lb 6.4 oz (122.7 kg)  07/10/20 245 lb 12.8 oz (111.5 kg)    GEN: Well nourished, well developed in no acute distress HEENT: Normal, moist mucous membranes NECK: No JVD CARDIAC: regular rhythm, normal S1 and S2, no rubs or gallops. No murmur. VASCULAR: Radial and DP pulses 2+ bilaterally. No carotid bruits RESPIRATORY:  Clear to auscultation without rales, wheezing or rhonchi  ABDOMEN: Soft, non-tender, non-distended MUSCULOSKELETAL:  Ambulates independently SKIN: Warm and dry, no edema NEUROLOGIC:  Alert and oriented x 3. No focal neuro deficits noted. PSYCHIATRIC:  Normal affect    ASSESSMENT:    1. Aortic atherosclerosis ([place])   2. Essential hypertension   3. Pure hypercholesterolemia   4. Class 3 severe obesity due to excess calories without serious comorbidity with body mass index (BMI) of 40.0 to 44.9 in adult [facility])   5. Cardiac risk counseling   6. Counseling on health promotion and disease prevention   7. Exercise counseling   8. Nutritional counseling   9. Encounter for weight loss counseling    PLAN:    Aortic atherosclerosis Hypercholesterolemia -discussed pathology of cholesterol, how plaques form, that MI/CVA result commonly from acute plaque rupture and not gradual stenosis. Discussed mechanism of statin to both decrease plaque accumulation and stabilize plaque that is already present. Discussed that calcium is a marker for plaque -we discussed the data on statins, both in terms of their long term benefit as well as the risk of side effects. Reviewed common misconceptions about statins. Reviewed how we  monitor treatment. After shared decision making, patient is agreeable to trialing statin.  -she is amenable to starting statin and aspirin today -baseline lipids per [facility]: Tchol 179, HDL 51, LDL 107, TG 117 -LDL goal <70, will start with rosuvastatin 5 mg and can increase to 10 mg if not at goal on lab check (pending May/June with Dr. Harrington Challenger)  Obesity, BMI 44.5 Abnormal glucose (A1c 6.1) -we discussed GLP1-RA for weight loss today. We discussed weight loss, activity, and diet at length -she has a phobia of needles but will think about GLP1RA and discuss with Dr. Harrington Challenger at her follow up visit  Hypertension: -at goal of <130/80 -continue valsartan-HCTZ -follow up with visit with Dr. Harrington Challenger  Cardiac risk counseling and prevention recommendations: -recommend heart healthy/Mediterranean diet, with whole grains, fruits, vegetable, fish, lean meats, nuts, and olive oil. Limit salt. -recommend moderate walking, 3-5 times/week for 30-50 minutes each  session. Aim for at least 150 minutes.week. Goal should be pace of 3 miles/hours, or walking 1.5 miles in 30 minutes -recommend avoidance of tobacco products. Avoid excess alcohol. -ASCVD risk score: 7.6% 10 year risk  Plan for follow up: 2 years or sooner as needed.  Buford Dresser, MD, PhD, [facility]    Medication Adjustments/Labs and Tests Ordered: Current medicines are reviewed at length with the patient today.  Concerns regarding medicines are outlined above.   Orders Placed This Encounter  Procedures   EKG 12-Lead   Meds ordered this encounter  Medications   rosuvastatin (CRESTOR) 5 MG tablet    Sig: Take 1 tablet (5 mg total) by mouth daily.    Dispense:  90 tablet    Refill:  3   aspirin EC 81 MG tablet    Sig: Take 1 tablet (81 mg total) by mouth daily. Swallow whole.    Dispense:  90 tablet    Refill:  3   Patient Instructions  Medication Instructions:  1) START: Aspirin 81 mg daily 2) START:  Rosuvastatin 5 mg daily  *If you need a refill on your cardiac medications before your next appointment, please call your pharmacy*   Lab Work: None ordered today   Testing/Procedures: None ordered today   Follow-Up: At [facility], you and your health needs are our priority.  As part of our continuing mission to provide you with exceptional heart care, we have created designated Provider Care Teams.  These Care Teams include your primary Cardiologist (physician) and Advanced Practice Providers (APPs -  Physician Assistants and Nurse Practitioners) who all work together to provide you with the care you need, when you need it.  We recommend signing up for the patient portal called "MyChart".  Sign up information is provided on this After Visit Summary.  MyChart is used to connect with patients for Virtual Visits (Telemedicine).  Patients are able to view lab/test results, encounter notes, upcoming appointments, etc.  Non-urgent messages can be sent to your provider as well.   To learn more about what you can do with MyChart, go to [email or web address].    Your next appointment:   2 year(s)  The format for your next appointment:   In Person  Provider:   Buford Dresser, MD      [facility],acting as a scribe for Buford Dresser, MD.,have documented all relevant documentation on the behalf of Buford Dresser, MD,as directed by  Buford Dresser, MD while in the presence of Buford Dresser, MD.  I, Buford Dresser, MD, have reviewed all documentation for this visit. The documentation on 10/14/21 for the exam, diagnosis, procedures, and orders are all accurate and complete.   Signed, Buford Dresser, MD PhD 10/14/2021 9:49 AM    [place]

## 2021-10-14 ENCOUNTER — Ambulatory Visit (HOSPITAL_BASED_OUTPATIENT_CLINIC_OR_DEPARTMENT_OTHER): Payer: BC Managed Care – PPO | Admitting: Cardiology

## 2021-10-14 ENCOUNTER — Other Ambulatory Visit: Payer: Self-pay

## 2021-10-14 ENCOUNTER — Encounter (HOSPITAL_BASED_OUTPATIENT_CLINIC_OR_DEPARTMENT_OTHER): Payer: Self-pay | Admitting: Cardiology

## 2021-10-14 VITALS — BP 124/78 | HR 66 | Ht 66.0 in | Wt 276.0 lb

## 2021-10-14 DIAGNOSIS — E7800 Pure hypercholesterolemia, unspecified: Secondary | ICD-10-CM

## 2021-10-14 DIAGNOSIS — Z713 Dietary counseling and surveillance: Secondary | ICD-10-CM

## 2021-10-14 DIAGNOSIS — I700 Atherosclerosis of aorta: Secondary | ICD-10-CM

## 2021-10-14 DIAGNOSIS — I10 Essential (primary) hypertension: Secondary | ICD-10-CM

## 2021-10-14 DIAGNOSIS — Z7182 Exercise counseling: Secondary | ICD-10-CM

## 2021-10-14 DIAGNOSIS — E66813 Obesity, class 3: Secondary | ICD-10-CM

## 2021-10-14 DIAGNOSIS — Z6841 Body Mass Index (BMI) 40.0 and over, adult: Secondary | ICD-10-CM

## 2021-10-14 DIAGNOSIS — Z7189 Other specified counseling: Secondary | ICD-10-CM

## 2021-10-14 MED ORDER — ASPIRIN EC 81 MG PO TBEC: 81.0000 mg | DELAYED_RELEASE_TABLET | Freq: Every day | ORAL | 3 refills | Status: AC

## 2021-10-14 MED ORDER — ROSUVASTATIN CALCIUM 5 MG PO TABS: 5.0000 mg | ORAL_TABLET | Freq: Every day | ORAL | 3 refills | Status: AC

## 2021-10-14 NOTE — Patient Instructions (Signed)
Medication Instructions:  1) START: Aspirin 81 mg daily 2) START: Rosuvastatin 5 mg daily  *If you need a refill on your cardiac medications before your next appointment, please call your pharmacy*   Lab Work: None ordered today   Testing/Procedures: None ordered today   Follow-Up: At [facility], you and your health needs are our priority.  As part of our continuing mission to provide you with exceptional heart care, we have created designated Provider Care Teams.  These Care Teams include your primary Cardiologist (physician) and Advanced Practice Providers (APPs -  Physician Assistants and Nurse Practitioners) who all work together to provide you with the care you need, when you need it.  We recommend signing up for the patient portal called "MyChart".  Sign up information is provided on this After Visit Summary.  MyChart is used to connect with patients for Virtual Visits (Telemedicine).  Patients are able to view lab/test results, encounter notes, upcoming appointments, etc.  Non-urgent messages can be sent to your provider as well.   To learn more about what you can do with MyChart, go to [email or web address].    Your next appointment:   2 year(s)  The format for your next appointment:   In Person  Provider:   Buford Dresser, MD

## 2021-10-24 ENCOUNTER — Encounter (HOSPITAL_BASED_OUTPATIENT_CLINIC_OR_DEPARTMENT_OTHER): Payer: Self-pay

## 2021-10-24 NOTE — Telephone Encounter (Signed)
Please advise with your thoughts

## 2021-11-05 DIAGNOSIS — M546 Pain in thoracic spine: Secondary | ICD-10-CM | POA: Diagnosis not present

## 2021-11-18 DIAGNOSIS — R531 Weakness: Secondary | ICD-10-CM | POA: Diagnosis not present

## 2021-11-18 DIAGNOSIS — M5414 Radiculopathy, thoracic region: Secondary | ICD-10-CM | POA: Diagnosis not present

## 2021-11-27 DIAGNOSIS — R531 Weakness: Secondary | ICD-10-CM | POA: Diagnosis not present

## 2021-11-27 DIAGNOSIS — M5414 Radiculopathy, thoracic region: Secondary | ICD-10-CM | POA: Diagnosis not present

## 2021-12-05 DIAGNOSIS — M65331 Trigger finger, right middle finger: Secondary | ICD-10-CM | POA: Diagnosis not present

## 2022-01-02 DIAGNOSIS — M65331 Trigger finger, right middle finger: Secondary | ICD-10-CM | POA: Diagnosis not present

## 2022-02-12 DIAGNOSIS — M109 Gout, unspecified: Secondary | ICD-10-CM | POA: Diagnosis not present

## 2022-02-12 DIAGNOSIS — E538 Deficiency of other specified B group vitamins: Secondary | ICD-10-CM | POA: Diagnosis not present

## 2022-02-12 DIAGNOSIS — Z13220 Encounter for screening for lipoid disorders: Secondary | ICD-10-CM | POA: Diagnosis not present

## 2022-02-12 DIAGNOSIS — E559 Vitamin D deficiency, unspecified: Secondary | ICD-10-CM | POA: Diagnosis not present

## 2022-02-12 DIAGNOSIS — Z0000 Encounter for general adult medical examination without abnormal findings: Secondary | ICD-10-CM | POA: Diagnosis not present

## 2022-02-19 DIAGNOSIS — R7309 Other abnormal glucose: Secondary | ICD-10-CM | POA: Diagnosis not present

## 2022-02-19 DIAGNOSIS — E538 Deficiency of other specified B group vitamins: Secondary | ICD-10-CM | POA: Diagnosis not present

## 2022-02-19 DIAGNOSIS — M109 Gout, unspecified: Secondary | ICD-10-CM | POA: Diagnosis not present

## 2022-03-28 DIAGNOSIS — G4733 Obstructive sleep apnea (adult) (pediatric): Secondary | ICD-10-CM | POA: Diagnosis not present

## 2022-04-28 DIAGNOSIS — G4733 Obstructive sleep apnea (adult) (pediatric): Secondary | ICD-10-CM | POA: Diagnosis not present

## 2022-05-29 DIAGNOSIS — G4733 Obstructive sleep apnea (adult) (pediatric): Secondary | ICD-10-CM | POA: Diagnosis not present

## 2022-07-02 ENCOUNTER — Ambulatory Visit: Payer: BC Managed Care – PPO | Admitting: Adult Health

## 2022-07-03 ENCOUNTER — Ambulatory Visit: Payer: BC Managed Care – PPO | Admitting: Adult Health

## 2022-07-17 NOTE — Progress Notes (Unsigned)
PATIENT: Gabrielle Moore DOB: 07-02-1956  REASON FOR VISIT: follow up HISTORY FROM: patient  Chief Complaint  Patient presents with   Follow-up    Pt in 4  Pt here for CPAP f/u  Pt states she has been moving all  last week so she hasn't used CPAP much       HISTORY OF PRESENT ILLNESS: Today 07/20/22:  Gabrielle Moore is a 66 year old female with a history of obstructive sleep apnea on CPAP.  She reports that she has been moving all last week-reports that she has not used CPAP much.  Download is below.  Denies any new issues.    06/26/21: Gabrielle Moore is a 66 year old female with a history of obstructive sleep apnea on CPAP.  She reports that the CPAP is working well for her.  She denies any new issues.  Reports that she is still trying to lose weight but has not been successful.    HISTORY 06/25/20:   Gabrielle Moore is a 66 year old female with a history of obstructive sleep apnea on CPAP. Her download indicates that she use her machine 28 out of 30 days for compliance of 93%. She used her machine greater than 4 hours 25 out of 30 days for compliance of 83%. On average she uses her machine 6 hours and 19 minutes. Her residual AHI is 0.3 on 6 to 12 cm of water with EPR of 3. Leak in the 95th percentile is 16.9 L/min. She returns today for follow-up   REVIEW OF SYSTEMS: Out of a complete 14 system review of symptoms, the patient complains only of the following symptoms, and all other reviewed systems are negative.  ESS 8  ALLERGIES: Allergies  Allergen Reactions   Codeine Nausea Only   Keflex [Cephalexin] Nausea Only    HOME MEDICATIONS: Outpatient Medications Prior to Visit  Medication Sig Dispense Refill   allopurinol (ZYLOPRIM) 300 MG tablet Take 1 tablet (300 mg total) by mouth daily. 30 tablet 3   aspirin EC 81 MG tablet Take 1 tablet (81 mg total) by mouth daily. Swallow whole. 90 tablet 3   Cholecalciferol (VITAMIN D) 50 MCG (2000 UT) CAPS Take 2,000 Units by  mouth daily.     co-enzyme Q-10 30 MG capsule Take 200 mg by mouth daily.     Cyanocobalamin (B-12) 3000 MCG CAPS Take 3,000 mcg by mouth daily.     loratadine (CLARITIN) 10 MG tablet Take 10 mg by mouth daily.     NON FORMULARY Pt uses cpap nightly     ondansetron (ZOFRAN-ODT) 4 MG disintegrating tablet Take 1 tablet (4 mg total) by mouth every 6 (six) hours as needed for nausea or vomiting. 20 tablet 0   oxyCODONE (OXY IR/ROXICODONE) 5 MG immediate release tablet Take 1 tablet (5 mg total) by mouth every 6 (six) hours as needed for severe pain. 10 tablet 0   Probiotic Product (PROBIOTIC-10) CHEW Chew by mouth daily.     rosuvastatin (CRESTOR) 5 MG tablet Take 1 tablet (5 mg total) by mouth daily. 90 tablet 3   TURMERIC PO Take by mouth daily.     valsartan-hydrochlorothiazide (DIOVAN-HCT) 160-12.5 MG tablet Take 1 tablet by mouth daily.     Vitamin D-Vitamin K (VITAMIN K2-VITAMIN D3 PO) Take 2,000 Units by mouth daily.     No facility-administered medications prior to visit.    PAST MEDICAL HISTORY: Past Medical History:  Diagnosis Date   Arthritis    hands   Asthma  as a child   Gout    Hernia, umbilical    Hypertension    Joint pain    Pre-diabetes    "LABELED AS PRE" -- TAKES NO MEDS   Seasonal allergies    Sleep apnea     PAST SURGICAL HISTORY: Past Surgical History:  Procedure Laterality Date   BREAST SURGERY  1998   breast reduction   CESAREAN SECTION  1996   HERNIA REPAIR     LAPAROSCOPIC GASTRIC BANDING  2009   LAPAROSCOPIC GASTRIC BANDING  2009   SHOULDER SURGERY     SINUS ENDO W/FUSION Left 05/26/2018   Procedure: ENDOSCOPIC SINUS SURGERY WITH NAVIGATION;  Surgeon: Jerrell Belfast, MD;  Location: [place];  Service: ENT;  Laterality: Left;   TONSILLECTOMY AND ADENOIDECTOMY     TURBINATE REDUCTION Bilateral 05/26/2018   Procedure: TURBINATE REDUCTION;  Surgeon: Jerrell Belfast, MD;  Location: [place];  Service: ENT;  Laterality: Bilateral;    FAMILY  HISTORY: Family History  Problem Relation Age of Onset   Hyperlipidemia Mother    Hypertension Mother    Hyperlipidemia Father    Hypertension Father    Heart disease Father 26   Heart attack Father    Depression Father    Stroke Father    Heart disease Other        parent    Sleep apnea Neg Hx     SOCIAL HISTORY: Social History   Socioeconomic History   Marital status: Married    Spouse name: Jori Moll   Number of children: Not on file   Years of education: Not on file   Highest education level: Not on file  Occupational History   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[employer]  Tobacco Use   Smoking status: Never   Smokeless tobacco: Never  Vaping Use   Vaping Use: Never used  Substance and Sexual Activity   Alcohol use: No   Drug use: No   Sexual activity: Not on file  Other Topics Concern   Not on file  Social History Narrative   Not on file   Social Determinants of Health   Financial Resource Strain: Not on file  Food Insecurity: Not on file  Transportation Needs: Not on file  Physical Activity: Not on file  Stress: Not on file  Social Connections: Not on file  Intimate Partner Violence: Not on file      PHYSICAL EXAM  Vitals:   07/21/22 1443  BP: 127/73  Pulse: 67  Weight: 251 lb 9.6 oz (114.1 kg)  Height: '5\' 5"'$  (1.651 m)    Body mass index is 41.87 kg/m.  Generalized: Well developed, in no acute distress  Chest: Lungs clear to auscultation bilaterally  Neurological examination  Mentation: Alert oriented to time, place, history taking. Follows all commands speech and language fluent Cranial nerve II-XII: Extraocular movements were full, visual field were full on confrontational test Head turning and shoulder shrug  were normal and symmetric. Gait and station: Gait is normal.    DIAGNOSTIC DATA (LABS, IMAGING, TESTING) - I reviewed patient records, labs, notes, testing and imaging myself where available.  Lab Results   Component Value Date   WBC 6.9 06/28/2020   HGB 14.8 06/28/2020   HCT 44.4 06/28/2020   MCV 93.1 06/28/2020   PLT 235 06/28/2020      Component Value Date/Time   NA 142 06/28/2020 1153   NA 142 09/28/2018 1556   K 3.7 06/28/2020 1153  CL 101 06/28/2020 1153   CO2 28 06/28/2020 1153   GLUCOSE 105 (H) 06/28/2020 1153   BUN 20 06/28/2020 1153   BUN 12 09/28/2018 1556   CREATININE 0.70 06/28/2020 1153   CREATININE 0.62 12/18/2017 1035   CALCIUM 9.6 06/28/2020 1153   PROT 7.3 06/15/2020 1754   PROT 7.4 09/28/2018 1556   ALBUMIN 4.0 06/15/2020 1754   ALBUMIN 4.2 09/28/2018 1556   AST 24 06/15/2020 1754   ALT 29 06/15/2020 1754   ALKPHOS 66 06/15/2020 1754   BILITOT 0.5 06/15/2020 1754   BILITOT 0.4 09/28/2018 1556   GFRNONAA >60 06/28/2020 1153   GFRAA 109 09/28/2018 1556   Lab Results  Component Value Date   CHOL 171 09/28/2018   HDL 56 09/28/2018   LDLCALC 93 09/28/2018   TRIG 108 09/28/2018   CHOLHDL 4 09/07/2017   Lab Results  Component Value Date   HGBA1C 6.1 (H) 09/28/2018   No results found for: "VITAMINB12" Lab Results  Component Value Date   TSH 2.960 09/28/2018      ASSESSMENT AND PLAN 66 y.o. year old female  has a past medical history of Arthritis, Asthma, Gout, Hernia, umbilical, Hypertension, Joint pain, Pre-diabetes, Seasonal allergies, and Sleep apnea. here with:  OSA on CPAP  - CPAP compliance excellent - Good treatment of AHI  - Encourage patient to use CPAP nightly and > 4 hours each night - F/U in 1 year or sooner if needed     Ward Givens, MSN, NP-C 07/20/2022, 4:47 PM [facility] [address] [place], [place] [address] [phone]

## 2022-07-21 ENCOUNTER — Encounter: Payer: Self-pay | Admitting: Adult Health

## 2022-07-21 ENCOUNTER — Ambulatory Visit: Payer: Medicare Other | Admitting: Adult Health

## 2022-07-21 VITALS — BP 127/73 | HR 67 | Ht 65.0 in | Wt 251.6 lb

## 2022-07-21 DIAGNOSIS — G4733 Obstructive sleep apnea (adult) (pediatric): Secondary | ICD-10-CM | POA: Diagnosis not present

## 2022-07-21 NOTE — Patient Instructions (Signed)

## 2022-09-29 DIAGNOSIS — K4090 Unilateral inguinal hernia, without obstruction or gangrene, not specified as recurrent: Secondary | ICD-10-CM | POA: Diagnosis not present

## 2022-09-30 DIAGNOSIS — K4090 Unilateral inguinal hernia, without obstruction or gangrene, not specified as recurrent: Secondary | ICD-10-CM | POA: Diagnosis not present

## 2022-10-01 ENCOUNTER — Ambulatory Visit
Admission: RE | Admit: 2022-10-01 | Discharge: 2022-10-01 | Disposition: A | Discharge status: Home / Self Care | Payer: Medicare Other | Source: Ambulatory Visit | Attending: *Deleted | Admitting: *Deleted

## 2022-10-01 ENCOUNTER — Other Ambulatory Visit: Payer: Self-pay | Admitting: Family Medicine

## 2022-10-01 DIAGNOSIS — R1030 Lower abdominal pain, unspecified: Secondary | ICD-10-CM | POA: Diagnosis not present

## 2022-10-01 DIAGNOSIS — K4090 Unilateral inguinal hernia, without obstruction or gangrene, not specified as recurrent: Secondary | ICD-10-CM

## 2022-10-10 DIAGNOSIS — Z1231 Encounter for screening mammogram for malignant neoplasm of breast: Secondary | ICD-10-CM | POA: Diagnosis not present

## 2022-12-18 DIAGNOSIS — M65342 Trigger finger, left ring finger: Secondary | ICD-10-CM | POA: Diagnosis not present

## 2023-03-10 DIAGNOSIS — Z0000 Encounter for general adult medical examination without abnormal findings: Secondary | ICD-10-CM | POA: Diagnosis not present

## 2023-03-10 DIAGNOSIS — E559 Vitamin D deficiency, unspecified: Secondary | ICD-10-CM | POA: Diagnosis not present

## 2023-03-10 DIAGNOSIS — E538 Deficiency of other specified B group vitamins: Secondary | ICD-10-CM | POA: Diagnosis not present

## 2023-03-10 DIAGNOSIS — I10 Essential (primary) hypertension: Secondary | ICD-10-CM | POA: Diagnosis not present

## 2023-03-10 DIAGNOSIS — M109 Gout, unspecified: Secondary | ICD-10-CM | POA: Diagnosis not present

## 2023-03-10 DIAGNOSIS — R7309 Other abnormal glucose: Secondary | ICD-10-CM | POA: Diagnosis not present

## 2023-03-10 DIAGNOSIS — I700 Atherosclerosis of aorta: Secondary | ICD-10-CM | POA: Diagnosis not present

## 2023-03-10 DIAGNOSIS — E7800 Pure hypercholesterolemia, unspecified: Secondary | ICD-10-CM | POA: Diagnosis not present

## 2023-03-18 DIAGNOSIS — L259 Unspecified contact dermatitis, unspecified cause: Secondary | ICD-10-CM | POA: Diagnosis not present

## 2023-03-18 DIAGNOSIS — L42 Pityriasis rosea: Secondary | ICD-10-CM | POA: Diagnosis not present

## 2023-03-23 DIAGNOSIS — Z1212 Encounter for screening for malignant neoplasm of rectum: Secondary | ICD-10-CM | POA: Diagnosis not present

## 2023-03-23 DIAGNOSIS — Z1211 Encounter for screening for malignant neoplasm of colon: Secondary | ICD-10-CM | POA: Diagnosis not present

## 2023-04-02 LAB — COLOGUARD: COLOGUARD: NEGATIVE

## 2023-04-02 LAB — EXTERNAL GENERIC LAB PROCEDURE: COLOGUARD: NEGATIVE

## 2023-04-23 DIAGNOSIS — M65342 Trigger finger, left ring finger: Secondary | ICD-10-CM | POA: Diagnosis not present

## 2023-05-13 DIAGNOSIS — B360 Pityriasis versicolor: Secondary | ICD-10-CM | POA: Diagnosis not present

## 2023-05-26 DIAGNOSIS — M65342 Trigger finger, left ring finger: Secondary | ICD-10-CM | POA: Diagnosis not present

## 2023-07-29 ENCOUNTER — Encounter: Payer: Self-pay | Admitting: Adult Health

## 2023-07-29 ENCOUNTER — Ambulatory Visit: Payer: Medicare Other | Admitting: Adult Health

## 2023-07-29 VITALS — BP 135/73 | HR 71 | Ht 66.0 in | Wt 267.0 lb

## 2023-07-29 DIAGNOSIS — G4733 Obstructive sleep apnea (adult) (pediatric): Secondary | ICD-10-CM | POA: Diagnosis not present

## 2023-08-30 ENCOUNTER — Encounter: Payer: Self-pay | Admitting: Adult Health

## 2023-09-15 DIAGNOSIS — M7062 Trochanteric bursitis, left hip: Secondary | ICD-10-CM | POA: Diagnosis not present

## 2023-09-15 DIAGNOSIS — M545 Low back pain, unspecified: Secondary | ICD-10-CM | POA: Diagnosis not present

## 2023-09-16 NOTE — Telephone Encounter (Signed)
 I called the patient and explained the response from [facility]. Pt verbalized understanding, will continue using her cpap machine, and will ask her PCP about the Zepbound. Her questions were answered.

## 2023-10-02 DIAGNOSIS — I10 Essential (primary) hypertension: Secondary | ICD-10-CM | POA: Diagnosis not present

## 2023-10-02 DIAGNOSIS — E7800 Pure hypercholesterolemia, unspecified: Secondary | ICD-10-CM | POA: Diagnosis not present

## 2023-10-12 DIAGNOSIS — H40053 Ocular hypertension, bilateral: Secondary | ICD-10-CM | POA: Diagnosis not present

## 2023-11-02 ENCOUNTER — Encounter (HOSPITAL_BASED_OUTPATIENT_CLINIC_OR_DEPARTMENT_OTHER): Payer: Self-pay

## 2023-11-02 ENCOUNTER — Encounter (HOSPITAL_BASED_OUTPATIENT_CLINIC_OR_DEPARTMENT_OTHER): Payer: Medicare Other | Admitting: Cardiology

## 2023-11-02 ENCOUNTER — Encounter (HOSPITAL_BASED_OUTPATIENT_CLINIC_OR_DEPARTMENT_OTHER): Payer: Self-pay | Admitting: Cardiology

## 2023-11-02 VITALS — BP 134/82 | HR 85 | Ht 66.0 in | Wt 274.8 lb

## 2023-11-02 DIAGNOSIS — I10 Essential (primary) hypertension: Secondary | ICD-10-CM

## 2023-11-02 NOTE — Progress Notes (Signed)
 Patient had to reschedule appt as I was running behind and she could not stay. ECG already completed as part of check in, which was personally read/reviewed. No charge for encounter today, ECG from today will be used at her follow up visit.  Jodelle Red, MD, PhD, [facility]  [facility]  [place]  [facility] at [facility] at [facility] [address], [address] [place], [place] [address] [phone]

## 2023-11-03 ENCOUNTER — Telehealth (HOSPITAL_BASED_OUTPATIENT_CLINIC_OR_DEPARTMENT_OTHER): Payer: Self-pay | Admitting: Cardiology

## 2023-11-03 NOTE — Telephone Encounter (Signed)
 Called pt to r/s her 2 year cardiology f/u with Dr Cristal Deer and pt refused to reschedule. Pt walked out of her appt on 11/02/23 b/c Dr Cristal Deer was running behind and, per pt, she thought waiting 62m was too long. (Note: Patient's co-pay was refunded on 11/02/23.) Patient did not wish to see Dr. Cristal Deer again since she felt the clinic has too many patients. I offered to ask for a recommendation for a different cardiologist and patient refused. Patient stated that she will reach out for an appt with a different doctor if/when she needs an appt.

## 2023-11-06 DIAGNOSIS — Z1231 Encounter for screening mammogram for malignant neoplasm of breast: Secondary | ICD-10-CM | POA: Diagnosis not present

## 2023-12-18 DIAGNOSIS — M7061 Trochanteric bursitis, right hip: Secondary | ICD-10-CM | POA: Diagnosis not present

## 2023-12-18 DIAGNOSIS — M7062 Trochanteric bursitis, left hip: Secondary | ICD-10-CM | POA: Diagnosis not present

## 2023-12-18 DIAGNOSIS — M5136 Other intervertebral disc degeneration, lumbar region with discogenic back pain only: Secondary | ICD-10-CM | POA: Diagnosis not present

## 2023-12-24 DIAGNOSIS — M47816 Spondylosis without myelopathy or radiculopathy, lumbar region: Secondary | ICD-10-CM | POA: Diagnosis not present

## 2023-12-24 DIAGNOSIS — R262 Difficulty in walking, not elsewhere classified: Secondary | ICD-10-CM | POA: Diagnosis not present

## 2023-12-30 DIAGNOSIS — M47816 Spondylosis without myelopathy or radiculopathy, lumbar region: Secondary | ICD-10-CM | POA: Diagnosis not present

## 2023-12-30 DIAGNOSIS — R262 Difficulty in walking, not elsewhere classified: Secondary | ICD-10-CM | POA: Diagnosis not present

## 2024-01-06 DIAGNOSIS — M47816 Spondylosis without myelopathy or radiculopathy, lumbar region: Secondary | ICD-10-CM | POA: Diagnosis not present

## 2024-01-06 DIAGNOSIS — R262 Difficulty in walking, not elsewhere classified: Secondary | ICD-10-CM | POA: Diagnosis not present

## 2024-01-08 DIAGNOSIS — M5416 Radiculopathy, lumbar region: Secondary | ICD-10-CM | POA: Diagnosis not present

## 2024-01-08 DIAGNOSIS — M7062 Trochanteric bursitis, left hip: Secondary | ICD-10-CM | POA: Diagnosis not present

## 2024-01-08 DIAGNOSIS — M7061 Trochanteric bursitis, right hip: Secondary | ICD-10-CM | POA: Diagnosis not present

## 2024-01-11 ENCOUNTER — Other Ambulatory Visit: Payer: Self-pay | Admitting: Family Medicine

## 2024-01-11 DIAGNOSIS — M5416 Radiculopathy, lumbar region: Secondary | ICD-10-CM

## 2024-01-11 DIAGNOSIS — M545 Low back pain, unspecified: Secondary | ICD-10-CM

## 2024-01-18 ENCOUNTER — Ambulatory Visit
Admission: RE | Admit: 2024-01-18 | Discharge: 2024-01-18 | Disposition: A | Discharge status: Home / Self Care | Source: Ambulatory Visit | Attending: Family Medicine | Admitting: Family Medicine

## 2024-01-18 DIAGNOSIS — M5126 Other intervertebral disc displacement, lumbar region: Secondary | ICD-10-CM | POA: Diagnosis not present

## 2024-01-18 DIAGNOSIS — M545 Low back pain, unspecified: Secondary | ICD-10-CM

## 2024-01-18 DIAGNOSIS — M5416 Radiculopathy, lumbar region: Secondary | ICD-10-CM

## 2024-01-18 DIAGNOSIS — M47816 Spondylosis without myelopathy or radiculopathy, lumbar region: Secondary | ICD-10-CM | POA: Diagnosis not present

## 2024-01-19 ENCOUNTER — Other Ambulatory Visit

## 2024-01-29 DIAGNOSIS — M5416 Radiculopathy, lumbar region: Secondary | ICD-10-CM | POA: Diagnosis not present

## 2024-02-09 DIAGNOSIS — M5416 Radiculopathy, lumbar region: Secondary | ICD-10-CM | POA: Diagnosis not present

## 2024-02-25 DIAGNOSIS — S20462A Insect bite (nonvenomous) of left back wall of thorax, initial encounter: Secondary | ICD-10-CM | POA: Diagnosis not present

## 2024-02-25 DIAGNOSIS — M5416 Radiculopathy, lumbar region: Secondary | ICD-10-CM | POA: Diagnosis not present

## 2024-02-26 DIAGNOSIS — M5416 Radiculopathy, lumbar region: Secondary | ICD-10-CM | POA: Diagnosis not present

## 2024-03-02 DIAGNOSIS — M5416 Radiculopathy, lumbar region: Secondary | ICD-10-CM | POA: Diagnosis not present

## 2024-03-04 DIAGNOSIS — M5416 Radiculopathy, lumbar region: Secondary | ICD-10-CM | POA: Diagnosis not present

## 2024-03-08 DIAGNOSIS — M5416 Radiculopathy, lumbar region: Secondary | ICD-10-CM | POA: Diagnosis not present

## 2024-03-10 DIAGNOSIS — M5416 Radiculopathy, lumbar region: Secondary | ICD-10-CM | POA: Diagnosis not present

## 2024-03-14 DIAGNOSIS — R7309 Other abnormal glucose: Secondary | ICD-10-CM | POA: Diagnosis not present

## 2024-03-14 DIAGNOSIS — E559 Vitamin D deficiency, unspecified: Secondary | ICD-10-CM | POA: Diagnosis not present

## 2024-03-14 DIAGNOSIS — R5383 Other fatigue: Secondary | ICD-10-CM | POA: Diagnosis not present

## 2024-03-14 DIAGNOSIS — E538 Deficiency of other specified B group vitamins: Secondary | ICD-10-CM | POA: Diagnosis not present

## 2024-03-14 DIAGNOSIS — M109 Gout, unspecified: Secondary | ICD-10-CM | POA: Diagnosis not present

## 2024-03-14 DIAGNOSIS — E7800 Pure hypercholesterolemia, unspecified: Secondary | ICD-10-CM | POA: Diagnosis not present

## 2024-03-14 DIAGNOSIS — I10 Essential (primary) hypertension: Secondary | ICD-10-CM | POA: Diagnosis not present

## 2024-03-18 DIAGNOSIS — Z0000 Encounter for general adult medical examination without abnormal findings: Secondary | ICD-10-CM | POA: Diagnosis not present

## 2024-03-18 DIAGNOSIS — R5383 Other fatigue: Secondary | ICD-10-CM | POA: Diagnosis not present

## 2024-03-18 DIAGNOSIS — E7800 Pure hypercholesterolemia, unspecified: Secondary | ICD-10-CM | POA: Diagnosis not present

## 2024-03-18 DIAGNOSIS — M109 Gout, unspecified: Secondary | ICD-10-CM | POA: Diagnosis not present

## 2024-03-18 DIAGNOSIS — E538 Deficiency of other specified B group vitamins: Secondary | ICD-10-CM | POA: Diagnosis not present

## 2024-03-18 DIAGNOSIS — I10 Essential (primary) hypertension: Secondary | ICD-10-CM | POA: Diagnosis not present

## 2024-03-18 DIAGNOSIS — E559 Vitamin D deficiency, unspecified: Secondary | ICD-10-CM | POA: Diagnosis not present

## 2024-03-27 ENCOUNTER — Emergency Department (HOSPITAL_COMMUNITY)
Admission: EM | Admit: 2024-03-27 | Discharge: 2024-03-28 | Disposition: A | Discharge status: Home / Self Care | Attending: Emergency Medicine | Admitting: Emergency Medicine

## 2024-03-27 ENCOUNTER — Other Ambulatory Visit: Payer: Self-pay

## 2024-03-27 ENCOUNTER — Encounter (HOSPITAL_COMMUNITY): Payer: Self-pay | Admitting: *Deleted

## 2024-03-27 DIAGNOSIS — Z7982 Long term (current) use of aspirin: Secondary | ICD-10-CM | POA: Diagnosis not present

## 2024-03-27 DIAGNOSIS — M25512 Pain in left shoulder: Secondary | ICD-10-CM | POA: Insufficient documentation

## 2024-03-27 DIAGNOSIS — S4981XA Other specified injuries of right shoulder and upper arm, initial encounter: Secondary | ICD-10-CM | POA: Diagnosis not present

## 2024-03-27 NOTE — ED Triage Notes (Signed)
 The pt injured her lr arm 2-3 weeks ago working  with bands  since then she has had pain  she went out of town  2-3 days ago  and while she was gone the pain increased from  her  lt shoulder down to her lt fingers   she has had 2-3 pain meds narcotic that is not helping the pain  she has a visit with [facility] next week

## 2024-03-27 NOTE — ED Notes (Signed)
 The pt took ocycodone 10 mg at 1800 and a muscle relaxer  at 2100 no pain relief

## 2024-03-28 DIAGNOSIS — M25512 Pain in left shoulder: Secondary | ICD-10-CM | POA: Diagnosis not present

## 2024-03-28 MED ORDER — OXYCODONE-ACETAMINOPHEN 5-325 MG PO TABS: 1.0000 | ORAL_TABLET | ORAL | 0 refills | Status: AC | PRN

## 2024-03-28 MED ORDER — ONDANSETRON 4 MG PO TBDP
8.0000 mg | ORAL_TABLET | Freq: Once | ORAL | Status: AC
  Administered 2024-03-28: 8 mg via ORAL
  Filled 2024-03-28: qty 2

## 2024-03-28 MED ORDER — HYDROMORPHONE HCL 1 MG/ML IJ SOLN
1.0000 mg | Freq: Once | INTRAMUSCULAR | Status: AC
  Administered 2024-03-28: 1 mg via INTRAMUSCULAR
  Filled 2024-03-28: qty 1

## 2024-03-28 NOTE — ED Provider Notes (Signed)
  EMERGENCY DEPARTMENT AT [facility] Provider Note   CSN: [id number] Arrival date & time: 03/27/24  2054     Patient presents with: No chief complaint on file.   Gabrielle Moore is a 68 y.o. female.   Patient presents to the emergency department for evaluation of left arm pain.  Patient reports that she originally injured her arm 2 or 3 weeks ago while working with bands to strengthen her core.  She initially felt pain in the bicep area.  Since then the pain has become more diffuse in the entire shoulder, not controlled by [place] and at home.  Cannot raise her arm, any movement causes severe pain.       Prior to Admission medications   Medication Sig Start Date End Date Taking? Authorizing Provider  oxyCODONE -acetaminophen  (PERCOCET) 5-325 MG tablet Take 1-2 tablets by mouth every 4 (four) hours as needed. 03/28/24  Yes Jazmarie Biever, Lonni PARAS, MD  allopurinol  (ZYLOPRIM ) 300 MG tablet Take 1 tablet (300 mg total) by mouth daily. 10/25/19   Gershon Donnice SAUNDERS, DPM  aspirin  EC 81 MG tablet Take 1 tablet (81 mg total) by mouth daily. Swallow whole. Patient not taking: Reported on 11/02/2023 10/14/21   Lonni Slain, MD  Cholecalciferol (VITAMIN D ) 50 MCG (2000 UT) CAPS Take 2,000 Units by mouth daily. Patient not taking: Reported on 11/02/2023    [provider]  Cyanocobalamin  (B-12) 3000 MCG CAPS Take 3,000 mcg by mouth daily.    [provider]  Levocetirizine Dihydrochloride (XYZAL PO) Take by mouth as needed.    [provider]  NON FORMULARY Pt uses cpap nightly    [provider]  rosuvastatin  (CRESTOR ) 5 MG tablet Take 1 tablet (5 mg total) by mouth daily. 10/14/21 07/29/23  Lonni Slain, MD  TURMERIC PO Take by mouth daily. Patient not taking: Reported on 11/02/2023    [provider]  valsartan -hydrochlorothiazide  (DIOVAN -HCT) 160-12.5 MG tablet Take 1 tablet by mouth daily. 06/15/20   [provider]    Allergies: Codeine and Keflex  [cephalexin ]    Review of Systems  Updated Vital Signs BP (!) 143/74 (BP Location: Right Arm)   Pulse 92   Temp 98.4 F (36.9 C) (Oral)   Resp 18   Ht 5' 6 (1.676 m)   Wt 124.6 kg   SpO2 100%   BMI 44.34 kg/m   Physical Exam Vitals and nursing note reviewed.  Constitutional:      Appearance: Normal appearance.  HENT:     Head: Normocephalic and atraumatic.  Cardiovascular:     Rate and Rhythm: Normal rate and regular rhythm.  Pulmonary:     Effort: Pulmonary effort is normal.     Breath sounds: Normal breath sounds.  Musculoskeletal:     Left shoulder: Tenderness (Diffuse) present. No swelling or deformity. Decreased range of motion.     Left upper arm: Tenderness (Along the bicep, no obvious deformity) present.  Neurological:     Mental Status: She is alert.     (all labs ordered are listed, but only abnormal results are displayed) Labs Reviewed - No data to display  EKG: None  Radiology: No results found.   Procedures   Medications Ordered in the ED  HYDROmorphone  (DILAUDID ) injection 1 mg (has no administration in time range)  ondansetron  (ZOFRAN -ODT) disintegrating tablet 8 mg (has no administration in time range)  Medical Decision Making Risk Prescription drug management.   Differential diagnosis considered includes, but not limited to: Muscle strain; rotator cuff injury; bicep rupture  Presents with acute onset of left arm pain 2 or 3 weeks ago while working out, pain worsening.  Difficult evaluation due to painful and admission.  No obvious bicep deficit.  Patient with diffuse anterior and posterior shoulder pain and decreased range of motion.  Does not tolerate passive range of motion.  Patient has follow-up with her orthopedic surgeon scheduled in 3 days.  Will provide increased analgesia, immobilizer.     Final diagnoses:  Acute pain of left shoulder     ED Discharge Orders          Ordered    oxyCODONE -acetaminophen  (PERCOCET) 5-325 MG tablet  Every 4 hours PRN        03/28/24 0014               Haze Lonni PARAS, MD 03/28/24 [phone]

## 2024-04-18 DIAGNOSIS — M25512 Pain in left shoulder: Secondary | ICD-10-CM | POA: Diagnosis not present

## 2024-04-20 DIAGNOSIS — R7309 Other abnormal glucose: Secondary | ICD-10-CM | POA: Diagnosis not present

## 2024-04-27 DIAGNOSIS — E119 Type 2 diabetes mellitus without complications: Secondary | ICD-10-CM | POA: Diagnosis not present

## 2024-05-10 DIAGNOSIS — E119 Type 2 diabetes mellitus without complications: Secondary | ICD-10-CM | POA: Diagnosis not present

## 2024-05-10 DIAGNOSIS — E7800 Pure hypercholesterolemia, unspecified: Secondary | ICD-10-CM | POA: Diagnosis not present

## 2024-05-10 DIAGNOSIS — I10 Essential (primary) hypertension: Secondary | ICD-10-CM | POA: Diagnosis not present

## 2024-06-29 DIAGNOSIS — B360 Pityriasis versicolor: Secondary | ICD-10-CM | POA: Diagnosis not present

## 2024-08-01 ENCOUNTER — Ambulatory Visit: Payer: Medicare Other | Admitting: Adult Health

## 2024-08-01 ENCOUNTER — Encounter: Payer: Self-pay | Admitting: Adult Health

## 2024-08-01 VITALS — BP 119/72 | HR 85 | Ht 66.0 in | Wt 273.0 lb

## 2024-08-01 DIAGNOSIS — G4733 Obstructive sleep apnea (adult) (pediatric): Secondary | ICD-10-CM | POA: Diagnosis not present

## 2024-08-01 NOTE — Progress Notes (Signed)
 PATIENT: Gabrielle Moore DOB: 1956-07-06  REASON FOR VISIT: follow up HISTORY FROM: patient  Chief Complaint  Patient presents with   RM 4    Patient is here alone for Sleep - no issues with machine, but needs more supplies  ESS - 3       HISTORY OF PRESENT ILLNESS: Today 08/01/24:  Gabrielle Moore is a 68 y.o. female with a history of OSA on CPAP.  Returns today for follow-up.  She reports that the CPAP is working well.  She continues to wear the fullface mask.  Denies any new issues.  She is on Mounjaro and has lost 13 pounds.  Her download is below     Gabrielle Moore is a 68 y.o. female with a history of osa on cpap. Returns today for follow-up. CPAP is working well. Denies any new issues.      07/21/22: [name] is a 68 year old female with a history of obstructive sleep apnea on CPAP.  She reports that she has been moving all last week-reports that she has not used CPAP much.  Download is below.  Denies any new issues.    06/26/21: Ms. Spoon is a 68 year old female with a history of obstructive sleep apnea on CPAP.  She reports that the CPAP is working well for her.  She denies any new issues.  Reports that she is still trying to lose weight but has not been successful.    HISTORY 06/25/20:   [name] is a 68 year old female with a history of obstructive sleep apnea on CPAP. Her download indicates that she use her machine 28 out of 30 days for compliance of 93%. She used her machine greater than 4 hours 25 out of 30 days for compliance of 83%. On average she uses her machine 6 hours and 19 minutes. Her residual AHI is 0.3 on 6 to 12 cm of water with EPR of 3. Leak in the 95th percentile is 16.9 L/min. She returns today for follow-up   REVIEW OF SYSTEMS: Out of a complete 14 system review of symptoms, the patient complains only of the following symptoms, and all other reviewed systems are negative.  ESS 8  ALLERGIES: Allergies  Allergen  Reactions   Codeine Nausea Only and Other (See Comments)   Keflex  [Cephalexin ] Nausea Only    HOME MEDICATIONS: Outpatient Medications Prior to Visit  Medication Sig Dispense Refill   allopurinol  (ZYLOPRIM ) 300 MG tablet Take 1 tablet (300 mg total) by mouth daily. 30 tablet 3   Levocetirizine Dihydrochloride (XYZAL PO) Take by mouth as needed.     NON FORMULARY Pt uses cpap nightly     rosuvastatin  (CRESTOR ) 5 MG tablet Take 1 tablet (5 mg total) by mouth daily. 90 tablet 3   valsartan -hydrochlorothiazide  (DIOVAN -HCT) 160-12.5 MG tablet Take 1 tablet by mouth daily.     aspirin  EC 81 MG tablet Take 1 tablet (81 mg total) by mouth daily. Swallow whole. (Patient not taking: Reported on 08/01/2024) 90 tablet 3   Cholecalciferol (VITAMIN D ) 50 MCG (2000 UT) CAPS Take 2,000 Units by mouth daily. (Patient not taking: Reported on 08/01/2024)     Cyanocobalamin  (B-12) 3000 MCG CAPS Take 3,000 mcg by mouth daily. (Patient not taking: Reported on 08/01/2024)     oxyCODONE -acetaminophen  (PERCOCET) 5-325 MG tablet Take 1-2 tablets by mouth every 4 (four) hours as needed. (Patient not taking: Reported on 08/01/2024) 15 tablet 0   TURMERIC PO Take by mouth daily. (Patient not  taking: Reported on 08/01/2024)     No facility-administered medications prior to visit.    PAST MEDICAL HISTORY: Past Medical History:  Diagnosis Date   Arthritis    hands   Asthma    as a child   Gout    Hernia, umbilical    Hypertension    Joint pain    Pre-diabetes    LABELED AS PRE -- TAKES NO MEDS   Seasonal allergies    Sleep apnea     PAST SURGICAL HISTORY: Past Surgical History:  Procedure Laterality Date   BREAST SURGERY  1998   breast reduction   CESAREAN SECTION  1996   HERNIA REPAIR     LAPAROSCOPIC GASTRIC BANDING  2009   LAPAROSCOPIC GASTRIC BANDING  2009   SHOULDER SURGERY     SINUS ENDO W/FUSION Left 05/26/2018   Procedure: ENDOSCOPIC SINUS SURGERY WITH NAVIGATION;  Surgeon: Mable Lenis, MD;  Location: [facility] OR;  Service: ENT;  Laterality: Left;   TONSILLECTOMY AND ADENOIDECTOMY     TURBINATE REDUCTION Bilateral 05/26/2018   Procedure: TURBINATE REDUCTION;  Surgeon: Mable Lenis, MD;  Location: [facility] OR;  Service: ENT;  Laterality: Bilateral;    FAMILY HISTORY: Family History  Problem Relation Age of Onset   Hyperlipidemia Mother    Hypertension Mother    Hyperlipidemia Father    Hypertension Father    Heart disease Father 39   Heart attack Father    Depression Father    Stroke Father    Heart disease Other        parent    Seizures Neg Hx    Migraines Neg Hx    Sleep apnea Neg Hx     SOCIAL HISTORY: Social History   Socioeconomic History   Marital status: Married    Spouse name: Tanda   Number of children: Not on file   Years of education: Not on file   Highest education level: Not on file  Occupational History   Occupation: Pharmacist, 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[place]  [employer]  Tobacco Use   Smoking status: Never   Smokeless tobacco: Never  Vaping Use   Vaping status: Never Used  Substance and Sexual Activity   Alcohol use: No   Drug use: No   Sexual activity: Not on file  Other Topics Concern   Not on file  Social History Narrative   Caffeine: 1 cup/day   Social Drivers of Corporate Investment Banker Strain: Not on file  Food Insecurity: Not on file  Transportation Needs: Not on file  Physical Activity: Not on file  Stress: Not on file  Social Connections: Not on file  Intimate Partner Violence: Not on file      PHYSICAL EXAM  Vitals:   08/01/24 1354  BP: 119/72  Pulse: 85  SpO2: 97%  Weight: 273 lb (123.8 kg)  Height: 5' 6 (1.676 m)    Body mass index is 44.06 kg/m.  Generalized: Well developed, in no acute distress  Chest: Lungs clear to auscultation bilaterally  Neurological examination  Mentation: Alert oriented to time, place, history taking. Follows all commands speech and language fluent Cranial nerve  II-XII: Facial symmetry noted   DIAGNOSTIC DATA (LABS, IMAGING, TESTING) - I reviewed patient records, labs, notes, testing and imaging myself where available.  Lab Results  Component Value Date   WBC 6.9 06/28/2020   HGB 14.8 06/28/2020   HCT 44.4 06/28/2020   MCV 93.1 06/28/2020   PLT 235 06/28/2020  Component Value Date/Time   NA 142 06/28/2020 1153   NA 142 09/28/2018 1556   K 3.7 06/28/2020 1153   CL 101 06/28/2020 1153   CO2 28 06/28/2020 1153   GLUCOSE 105 (H) 06/28/2020 1153   BUN 20 06/28/2020 1153   BUN 12 09/28/2018 1556   CREATININE 0.70 06/28/2020 1153   CREATININE 0.62 12/18/2017 1035   CALCIUM  9.6 06/28/2020 1153   PROT 7.3 06/15/2020 1754   PROT 7.4 09/28/2018 1556   ALBUMIN 4.0 06/15/2020 1754   ALBUMIN 4.2 09/28/2018 1556   AST 24 06/15/2020 1754   ALT 29 06/15/2020 1754   ALKPHOS 66 06/15/2020 1754   BILITOT 0.5 06/15/2020 1754   BILITOT 0.4 09/28/2018 1556   GFRNONAA >60 06/28/2020 1153   GFRAA 109 09/28/2018 1556   Lab Results  Component Value Date   CHOL 171 09/28/2018   HDL 56 09/28/2018   LDLCALC 93 09/28/2018   TRIG 108 09/28/2018   CHOLHDL 4 09/07/2017   Lab Results  Component Value Date   HGBA1C 6.1 (H) 09/28/2018   No results found for: [id number] Lab Results  Component Value Date   TSH 2.960 09/28/2018      ASSESSMENT AND PLAN 68 y.o. year old female  has a past medical history of Arthritis, Asthma, Gout, Hernia, umbilical, Hypertension, Joint pain, Pre-diabetes, Seasonal allergies, and Sleep apnea. here with:  OSA on CPAP  - CPAP compliance excellent - Good treatment of AHI  - Encourage patient to use CPAP nightly and > 4 hours each night - F/U in 1 year or sooner if needed     Duwaine Russell, MSN, NP-C 08/01/2024, 2:07 PM [facility] [address] [place], [place] [address] [phone]  The patient's condition requires frequent monitoring and adjustments in the treatment  plan, reflecting the ongoing complexity of care.  This provider is the continuing focal point for all needed services for this condition.

## 2025-08-16 ENCOUNTER — Ambulatory Visit: Admitting: Adult Health
